# Patient Record
Sex: Male | Born: 1960
Health system: Southern US, Community
[De-identification: ages and names within clinical notes are randomized; demographics above are authoritative.]

## PROBLEM LIST (undated history)

## (undated) DIAGNOSIS — R011 Cardiac murmur, unspecified: Secondary | ICD-10-CM

## (undated) DIAGNOSIS — T7840XA Allergy, unspecified, initial encounter: Secondary | ICD-10-CM

## (undated) DIAGNOSIS — G4730 Sleep apnea, unspecified: Secondary | ICD-10-CM

## (undated) DIAGNOSIS — T4145XA Adverse effect of unspecified anesthetic, initial encounter: Secondary | ICD-10-CM

## (undated) DIAGNOSIS — R112 Nausea with vomiting, unspecified: Secondary | ICD-10-CM

## (undated) DIAGNOSIS — N200 Calculus of kidney: Secondary | ICD-10-CM

## (undated) DIAGNOSIS — E119 Type 2 diabetes mellitus without complications: Secondary | ICD-10-CM

## (undated) DIAGNOSIS — F419 Anxiety disorder, unspecified: Secondary | ICD-10-CM

## (undated) DIAGNOSIS — Z9889 Other specified postprocedural states: Secondary | ICD-10-CM

## (undated) DIAGNOSIS — C801 Malignant (primary) neoplasm, unspecified: Secondary | ICD-10-CM

## (undated) DIAGNOSIS — E291 Testicular hypofunction: Secondary | ICD-10-CM

## (undated) DIAGNOSIS — E785 Hyperlipidemia, unspecified: Secondary | ICD-10-CM

## (undated) DIAGNOSIS — E039 Hypothyroidism, unspecified: Secondary | ICD-10-CM

## (undated) HISTORY — DX: Anxiety disorder, unspecified: F41.9

## (undated) HISTORY — PX: COLONOSCOPY: SHX174

## (undated) HISTORY — DX: Testicular hypofunction: E29.1

## (undated) HISTORY — DX: Allergy, unspecified, initial encounter: T78.40XA

## (undated) HISTORY — DX: Hyperlipidemia, unspecified: E78.5

---

## 1999-08-31 DIAGNOSIS — T8859XA Other complications of anesthesia, initial encounter: Secondary | ICD-10-CM

## 1999-08-31 HISTORY — PX: THYROIDECTOMY: SHX17

## 1999-08-31 HISTORY — DX: Other complications of anesthesia, initial encounter: T88.59XA

## 2009-10-01 ENCOUNTER — Ambulatory Visit: Payer: Self-pay | Admitting: Internal Medicine

## 2009-10-01 DIAGNOSIS — E039 Hypothyroidism, unspecified: Secondary | ICD-10-CM | POA: Insufficient documentation

## 2009-10-01 DIAGNOSIS — E1169 Type 2 diabetes mellitus with other specified complication: Secondary | ICD-10-CM | POA: Insufficient documentation

## 2009-10-01 DIAGNOSIS — F528 Other sexual dysfunction not due to a substance or known physiological condition: Secondary | ICD-10-CM

## 2009-10-01 LAB — CONVERTED CEMR LAB
Albumin: 4.1 g/dL (ref 3.5–5.2)
Cholesterol: 205 mg/dL — ABNORMAL HIGH (ref 0–200)
Direct LDL: 127.3 mg/dL
HDL: 35.1 mg/dL — ABNORMAL LOW (ref >39.00)
TSH: 0.09 microintl units/mL — ABNORMAL LOW (ref 0.35–5.50)
Total Bilirubin: 0.5 mg/dL (ref 0.3–1.2)
Total CHOL/HDL Ratio: 6
Triglycerides: 175 mg/dL — ABNORMAL HIGH (ref 0.0–149.0)
VLDL: 35 mg/dL (ref 0.0–40.0)

## 2009-10-02 ENCOUNTER — Telehealth (INDEPENDENT_AMBULATORY_CARE_PROVIDER_SITE_OTHER): Payer: Self-pay | Admitting: *Deleted

## 2009-10-02 ENCOUNTER — Telehealth: Payer: Self-pay | Admitting: Internal Medicine

## 2009-10-02 ENCOUNTER — Encounter: Payer: Self-pay | Admitting: Internal Medicine

## 2009-11-12 ENCOUNTER — Ambulatory Visit: Payer: Self-pay | Admitting: Internal Medicine

## 2009-11-12 DIAGNOSIS — Z87442 Personal history of urinary calculi: Secondary | ICD-10-CM | POA: Insufficient documentation

## 2009-12-25 ENCOUNTER — Ambulatory Visit: Payer: Self-pay | Admitting: Internal Medicine

## 2009-12-25 DIAGNOSIS — Z9989 Dependence on other enabling machines and devices: Secondary | ICD-10-CM

## 2009-12-25 DIAGNOSIS — G4733 Obstructive sleep apnea (adult) (pediatric): Secondary | ICD-10-CM | POA: Insufficient documentation

## 2010-01-09 ENCOUNTER — Ambulatory Visit: Payer: Self-pay | Admitting: Pulmonary Disease

## 2010-01-09 DIAGNOSIS — G4726 Circadian rhythm sleep disorder, shift work type: Secondary | ICD-10-CM | POA: Insufficient documentation

## 2010-02-18 ENCOUNTER — Encounter: Payer: Self-pay | Admitting: Pulmonary Disease

## 2010-02-18 ENCOUNTER — Ambulatory Visit (HOSPITAL_BASED_OUTPATIENT_CLINIC_OR_DEPARTMENT_OTHER): Admission: RE | Admit: 2010-02-18 | Discharge: 2010-02-18 | Payer: Self-pay | Admitting: Pulmonary Disease

## 2010-02-19 ENCOUNTER — Telehealth: Payer: Self-pay | Admitting: Pulmonary Disease

## 2010-02-19 ENCOUNTER — Ambulatory Visit: Payer: Self-pay | Admitting: Pulmonary Disease

## 2010-02-24 ENCOUNTER — Encounter: Payer: Self-pay | Admitting: Pulmonary Disease

## 2010-02-26 ENCOUNTER — Encounter: Payer: Self-pay | Admitting: Pulmonary Disease

## 2010-04-27 ENCOUNTER — Telehealth: Payer: Self-pay | Admitting: Internal Medicine

## 2010-04-27 DIAGNOSIS — N281 Cyst of kidney, acquired: Secondary | ICD-10-CM | POA: Insufficient documentation

## 2010-04-28 ENCOUNTER — Encounter: Payer: Self-pay | Admitting: Pulmonary Disease

## 2010-05-01 ENCOUNTER — Encounter: Admission: RE | Admit: 2010-05-01 | Discharge: 2010-05-01 | Payer: Self-pay | Admitting: Internal Medicine

## 2010-05-05 ENCOUNTER — Telehealth: Payer: Self-pay | Admitting: Internal Medicine

## 2010-06-16 ENCOUNTER — Encounter: Payer: Self-pay | Admitting: Pulmonary Disease

## 2010-09-29 NOTE — Letter (Signed)
Summary: [facility] Results Follow Up Letter  [facility]  [address]   [place], [place] [address]   Phone: [phone]  Fax: [phone]    02/24/2010 MRN: [id number]  Daniel Case [address] [place], [place]  [address]  Dear Mr. Downie,  We have received the results from your recent tests and have been unable to contact you.  Please call our office at [phone] so that Dr.___________ or his nurse may review the results with you.    Thank you,  Nature conservation officer Pulmonary Division

## 2010-09-29 NOTE — Progress Notes (Signed)
Summary: Medical Release completed  Medical Release completed to obtain records from Dr. Willa Rough. After numerous failed attempts, records were mailed to [address] # Daniel Case [place], [place] [address]. [name]  October 02, 2009 3:00 PM

## 2010-09-29 NOTE — Assessment & Plan Note (Signed)
Summary: NEW [employer] PT-PKG/OFF--STC   Vital Signs:  Patient profile:   50 year old male Height:      76 inches Weight:      222.50 pounds BMI:     27.18 O2 Sat:      96 % on Room air Temp:     98.3 degrees F oral Pulse rate:   65 / minute Pulse rhythm:   regular Resp:     16 per minute BP sitting:   130 / 80  (left arm)  Vitals Entered By: Lucious Groves (October 01, 2009 3:54 PM)  Nutrition Counseling: Patient's BMI is greater than 25 and therefore counseled on weight management options.  O2 Flow:  Room air CC: NP--Est care, no complaints. Is Patient Diabetic? No Pain Assessment Patient in pain? no        Primary Care Provider:  Yetta Barre  CC:  NP--Est care and no complaints..  History of Present Illness: New to me he needs a new PCP after moving here from [facility]. He feels well and offers no complaints. He requests a refill on Cialis.  Preventive Screening-Counseling & Management  Alcohol-Tobacco     Alcohol drinks/day: 1     Alcohol type: beer     >5/day in last 3 mos: no     Alcohol Counseling: not indicated; use of alcohol is not excessive or problematic     Feels need to cut down: no     Feels annoyed by complaints: no     Feels guilty re: drinking: no     Needs 'eye opener' in am: no     Smoking Status: never  Hep-HIV-STD-Contraception     Hepatitis Risk: no risk noted     HIV Risk: no risk noted     STD Risk: no risk noted     TSE monthly: yes  Safety-Violence-Falls     Seat Belt Use: yes     Helmet Use: yes     Firearms in the Home: no firearms in the home     Smoke Detectors: yes     Violence in the Home: no risk noted     Sexual Abuse: no      Sexual History:  currently monogamous.        Drug Use:  never.        Blood Transfusions:  no.    Current Medications (verified): 1)  Synthroid 125 Mcg Tabs (Levothyroxine Sodium) .Marland Kitchen.. 1 Po Bid 2)  Mvi .... Occasionally 3)  Fish Oil .... Occasionally  Allergies (verified): 1)  ! *  Shellfish  Past History:  Past Medical History: Hypothyroidism  Past Surgical History: Thyroidectomy  Social History: Smoking Status:  never Hepatitis Risk:  no risk noted HIV Risk:  no risk noted STD Risk:  no risk noted Seat Belt Use:  yes Sexual History:  currently monogamous Drug Use:  never Blood Transfusions:  no  Review of Systems  The patient denies anorexia, fever, weight loss, weight gain, chest pain, syncope, dyspnea on exertion, peripheral edema, prolonged cough, headaches, hemoptysis, abdominal pain, difficulty walking, depression, enlarged lymph nodes, angioedema, and testicular masses.   GU:  Complains of erectile dysfunction; denies decreased libido, dysuria, hematuria, incontinence, nocturia, urinary frequency, and urinary hesitancy. Endo:  Denies cold intolerance, excessive hunger, excessive thirst, excessive urination, heat intolerance, polyuria, and weight change.  Physical Exam  General:  Well-developed,well-nourished,in no acute distress; alert,appropriate and cooperative throughout examination Head:  normocephalic, atraumatic, no abnormalities observed, and no abnormalities palpated.  Mouth:  Oral mucosa and oropharynx without lesions or exudates.  Teeth in good repair. Neck:  supple, no masses, no thyromegaly, no thyroid nodules or tenderness, no JVD, no carotid bruits, and no cervical lymphadenopathy.  +scar. Lungs:  Normal respiratory effort, chest expands symmetrically. Lungs are clear to auscultation, no crackles or wheezes. Heart:  Normal rate and regular rhythm. S1 and S2 normal without gallop, murmur, click, rub or other extra sounds. Abdomen:  Bowel sounds positive,abdomen soft and non-tender without masses, organomegaly or hernias noted. Msk:  No deformity or scoliosis noted of thoracic or lumbar spine.   Pulses:  R and L carotid,radial,femoral,dorsalis pedis and posterior tibial pulses are full and equal bilaterally Extremities:  No clubbing,  cyanosis, edema, or deformity noted with normal full range of motion of all joints.   Neurologic:  No cranial nerve deficits noted. Station and gait are normal. Plantar reflexes are down-going bilaterally. DTRs are symmetrical throughout. Sensory, motor and coordinative functions appear intact. Skin:  Intact without suspicious lesions or rashes Cervical Nodes:  No lymphadenopathy noted Psych:  Cognition and judgment appear intact. Alert and cooperative with normal attention span and concentration. No apparent delusions, illusions, hallucinations   Impression & Recommendations:  Problem # 1:  ERECTILE DYSFUNCTION (ICD-302.72) Assessment Unchanged  His updated medication list for this problem includes:    Cialis 20 Mg Tabs (Tadalafil) .Marland Kitchen... Take as directed  Discussed proper use of medications, as well as side effects.   Problem # 2:  HYPOTHYROIDISM (ICD-244.9) Assessment: Deteriorated  His updated medication list for this problem includes:    Synthroid 125 Mcg Tabs (Levothyroxine sodium) .Marland Kitchen... 1 po bid  Orders: Venipuncture ([address]) TLB-Lipid Panel (80061-LIPID) TLB-Hepatic/Liver Function Pnl (80076-HEPATIC) TLB-TSH (Thyroid Stimulating Hormone) (84443-TSH)  Complete Medication List: 1)  Synthroid 125 Mcg Tabs (Levothyroxine sodium) .Marland Kitchen.. 1 po bid 2)  Mvi  .... Occasionally 3)  Fish Oil  .... Occasionally 4)  Cialis 20 Mg Tabs (Tadalafil) .... Take as directed  Patient Instructions: 1)  Please schedule a follow-up appointment in 4 months for a complete physical. 2)  It is important that you exercise regularly at least 20 minutes 5 times a week. If you develop chest pain, have severe difficulty breathing, or feel very tired , stop exercising immediately and seek medical attention. 3)  You need to lose weight. Consider a lower calorie diet and regular exercise.  Prescriptions: CIALIS 20 MG TABS (TADALAFIL) Take as directed  #3 x 11   Entered and Authorized by:   Etta Grandchild  MD   Signed by:   Etta Grandchild MD on 10/01/2009   Method used:   Print then Give to Patient   RxID:   [id number]     Immunization History:  Tetanus/Td Immunization History:    Tetanus/Td:  td (01/08/2004)  Not Administered:    Influenza Vaccine # 1 not given due to: Pt had in 05/2009

## 2010-09-29 NOTE — Letter (Signed)
Summary: Results Follow-up Letter  [facility]  [address] [place], [place] [address]   Phone: [phone]  Fax: [phone]    10/02/2009  [address] [place], [place]  [address]  Dear Daniel Case,   The following are the results of your recent test(s):  Test     Result     Thyroid     dose is a little too high Liver       normal   _________________________________________________________  Please call for an appointment in 1-2 months _________________________________________________________ _________________________________________________________ _________________________________________________________  Sincerely,  Sanda Linger MD [facility]

## 2010-09-29 NOTE — Progress Notes (Signed)
Summary: NEEDS ORDER FOR TEST  Phone Note Call from Patient Call back at Home Phone [phone]   Summary of Call: Patient states that he is due for f/u "kidney scan". Last one was last year in [place].  Initial call taken by: Lamar Sprinkles, CMA,  April 27, 2010 12:27 PM  Follow-up for Phone Call        done Follow-up by: Etta Grandchild MD,  April 27, 2010 12:31 PM  Additional Follow-up for Phone Call Additional follow up Details #1::        Pt informed  Additional Follow-up by: Lamar Sprinkles, CMA,  April 27, 2010 1:47 PM  New Problems: RENAL CYST (ICD-593.2)   New Problems: RENAL CYST (ICD-593.2)

## 2010-09-29 NOTE — Assessment & Plan Note (Signed)
Summary: 1-2 mos f/u per wife/#/cd    Vital Signs:  Patient profile:   50 year old male Height:      76 inches Weight:      227 pounds BMI:     27.73 O2 Sat:      97 % on Room air Temp:     97.6 degrees F oral Pulse rate:   67 / minute Pulse rhythm:   regular Resp:     16 per minute BP sitting:   128 / 70  (left arm) Cuff size:   large  Vitals Entered By: Rock Nephew CMA (November 12, 2009 8:51 AM)  O2 Flow:  Room air CC: follow-up visit Is Patient Diabetic? No Pain Assessment Patient in pain? no        Primary Care Provider:  Yetta Barre  CC:  follow-up visit.  History of Present Illness: He returns for a thyroid recheck, has last TSH was suppressed. He feels well.  Preventive Screening-Counseling & Management  Alcohol-Tobacco     Alcohol drinks/day: 1     Alcohol type: beer     >5/day in last 3 mos: no     Alcohol Counseling: not indicated; use of alcohol is not excessive or problematic     Feels need to cut down: no     Feels annoyed by complaints: no     Feels guilty re: drinking: no     Needs 'eye opener' in am: no     Smoking Status: never  Caffeine-Diet-Exercise     Does Patient Exercise: yes  Hep-HIV-STD-Contraception     Hepatitis Risk: no risk noted     HIV Risk: no risk noted     STD Risk: no risk noted     TSE monthly: yes      Sexual History:  currently monogamous.        Drug Use:  no.        Blood Transfusions:  no.    Clinical Review Panels:  Lipid Management   Cholesterol:  205 (10/01/2009)   HDL (good cholesterol):  35.10 (10/01/2009)  Complete Metabolic Panel   Albumin:  4.1 (10/01/2009)   Total Protein:  6.6 (10/01/2009)   Total Bili:  0.5 (10/01/2009)   Alk Phos:  68 (10/01/2009)   SGPT (ALT):  27 (10/01/2009)   SGOT (AST):  24 (10/01/2009)   Current Medications (verified): 1)  Synthroid 125 Mcg Tabs (Levothyroxine Sodium) .Marland Kitchen.. 1 Po Bid 2)  Mvi .... Occasionally 3)  Fish Oil .... Occasionally 4)  Cialis 20 Mg Tabs  (Tadalafil) .... Take As Directed  Allergies (verified): 1)  ! * Shellfish  Past History:  Past Medical History: Hypothyroidism Nephrolithiasis, hx of Benign renal cysts (B)  Past Surgical History: Reviewed history from 10/01/2009 and no changes required. Thyroidectomy  Family History: none  Social History: Occupation: Estate manager/land agent. Married Never Smoked Alcohol use-no Drug use-no Regular exercise-yes Drug Use:  no Does Patient Exercise:  yes  Review of Systems       The patient complains of weight gain.  The patient denies anorexia, weight loss, hoarseness, chest pain, syncope, dyspnea on exertion, peripheral edema, prolonged cough, abdominal pain, hematuria, difficulty walking, depression, enlarged lymph nodes, and angioedema.   Endo:  Denies cold intolerance, excessive hunger, excessive thirst, excessive urination, heat intolerance, polyuria, and weight change.  Physical Exam  General:  Well-developed,well-nourished,in no acute distress; alert,appropriate and cooperative throughout examination Neck:  supple, no masses, no thyromegaly, no thyroid nodules or tenderness, no JVD,  no carotid bruits, and no cervical lymphadenopathy.  +scar. Lungs:  Normal respiratory effort, chest expands symmetrically. Lungs are clear to auscultation, no crackles or wheezes. Heart:  Normal rate and regular rhythm. S1 and S2 normal without gallop, murmur, click, rub or other extra sounds. Abdomen:  Bowel sounds positive,abdomen soft and non-tender without masses, organomegaly or hernias noted. Msk:  No deformity or scoliosis noted of thoracic or lumbar spine.   Pulses:  R and L carotid,radial,femoral,dorsalis pedis and posterior tibial pulses are full and equal bilaterally Extremities:  No clubbing, cyanosis, edema, or deformity noted with normal full range of motion of all joints.   Neurologic:  No cranial nerve deficits noted. Station and gait are normal. Plantar reflexes are down-going  bilaterally. DTRs are symmetrical throughout. Sensory, motor and coordinative functions appear intact. Skin:  Intact without suspicious lesions or rashes Cervical Nodes:  no anterior cervical adenopathy and no posterior cervical adenopathy.   Axillary Nodes:  no R axillary adenopathy and no L axillary adenopathy.   Psych:  Cognition and judgment appear intact. Alert and cooperative with normal attention span and concentration. No apparent delusions, illusions, hallucinations   Impression & Recommendations:  Problem # 1:  HYPOTHYROIDISM (ICD-244.9) Assessment Unchanged  The following medications were removed from the medication list:    Synthroid 125 Mcg Tabs (Levothyroxine sodium) .Marland Kitchen... 1 po bid His updated medication list for this problem includes:    Levothyroxine Sodium 150 Mcg Tabs (Levothyroxine sodium) ..... One by mouth once daily  Orders: Venipuncture (66440) TLB-TSH (Thyroid Stimulating Hormone) (84443-TSH)  Labs Reviewed: TSH: 0.09 (10/01/2009)    Chol: 205 (10/01/2009)   HDL: 35.10 (10/01/2009)   TG: 175.0 (10/01/2009)  Complete Medication List: 1)  Mvi  .... Occasionally 2)  Fish Oil  .... Occasionally 3)  Cialis 20 Mg Tabs (Tadalafil) .... Take as directed 4)  Levothyroxine Sodium 150 Mcg Tabs (Levothyroxine sodium) .... One by mouth once daily  Patient Instructions: 1)  Please schedule a follow-up appointment in 3 months. Prescriptions: LEVOTHYROXINE SODIUM 150 MCG TABS (LEVOTHYROXINE SODIUM) One by mouth once daily  #30 x 5   Entered and Authorized by:   Etta Grandchild MD   Signed by:   Etta Grandchild MD on 11/12/2009   Method used:   Electronically to        [facility] [address] (retail)       [phone]       [place], [place]  [address]       Ph: [phone]       Fax: [phone]   RxID:   [id number]

## 2010-09-29 NOTE — Progress Notes (Signed)
  Phone Note Outgoing Call   Summary of Call: Attempted to reach pt for results of sleep study - showed severe obstructive sleep apnea  - CPAP will be set up - OV in 2 weeks to discuss Initial call taken by: Comer Locket. Vassie Loll MD,  February 19, 2010 5:29 PM  Follow-up for Phone Call        unable to reach pt mailed note to call us back Follow-up by: Oneita Jolly,  February 24, 2010 10:22 AM

## 2010-09-29 NOTE — Assessment & Plan Note (Signed)
Summary: sleep apnea/Daniel Case   Visit Type:  Initial Consult Copy to:  pcp Primary Provider/Referring Provider:  Etta Grandchild MD  CC:  Pt here for sleep consult.  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 Epworth Sleepiness Score 9. He has worked the night shift x 16 yrs & has moved from [place] to [place] to be closer to his daughter. Bedtime is 0900, minimal sleep latency, 2-4 awkenings for BR visits, wakes up at 1600, feeling refreshed with occasional headaches & dry mouth. On weekends, he slips back to normal schedule of 0200 bedtime & 0900 wake up time. He has gained about 15 lbs int he last 2 yrs. His father has obstructive sleep apnea on cpap. He drinks 6 cups of coffee/ night of work. There is no history suggestive of cataplexy, sleep paralysis or parasomnias    History of Present Illness: stop breathing  What time do you typically go to bed?(between what hours): 0900-1400  How long does it take you to fall asleep? 0-10 min  How many times during the night do you wake up? 2-4  What time do you get out of bed to start your day? 1600  Do you drive or operate heavy machinery in your occupation? yes  How much has your weight changed (up or down) over the past two years? (in pounds): +10 lbs  Have you ever had a sleep study before?  If yes,when and where: no  Do you currently use CPAP ? If so , at what pressure? no  Do you wear oxygen at any time? If yes, how many liters per minute? no Current Medications (verified): 1)  Multivitamins   Tabs (Multiple Vitamin) .... Take 1 Tablet By Mouth Once A Day 2)  Fish Oil .... Occasionally 3)  Cialis 20 Mg Tabs (Tadalafil) .... Take As Directed 4)  Levothyroxine Sodium 150 Mcg Tabs (Levothyroxine Sodium) .... One By Mouth Once Daily  Allergies (verified): 1)  ! * Shellfish  Past History:  Past Medical History: Last updated:  11/12/2009 Hypothyroidism Nephrolithiasis, hx of Benign renal cysts (B)  Past Surgical History: Thyroidectomy- 2002  Social History: Occupation: Estate manager/land agent. Married 3 children Alcohol use-no Drug use-no Regular exercise-yes Occ cigar  Review of Systems       The patient complains of weight change and nasal congestion/difficulty breathing through nose.  The patient denies shortness of breath with activity, shortness of breath at rest, productive cough, non-productive cough, coughing up blood, chest pain, irregular heartbeats, acid heartburn, indigestion, loss of appetite, abdominal pain, difficulty swallowing, sore throat, tooth/dental problems, headaches, sneezing, itching, ear ache, anxiety, depression, hand/feet swelling, joint stiffness or pain, rash, change in color of mucus, and fever.    Vital Signs:  Patient profile:   50 year old male Height:      76 inches Weight:      229 pounds BMI:     27.98 O2 Sat:      96 % on Room air Temp:     98.3 degrees F oral Pulse rate:   77 / minute BP sitting:   130 / 90  (left arm) Cuff size:   large  Vitals Entered By: Zackery Barefoot CMA (Jan 09, 2010 4:25 PM)  O2 Flow:  Room air CC: Pt here for sleep consult Comments Medications reviewed with patient Verified contact number and pharmacy with patient Zackery Barefoot CMA  Jan 09, 2010 4:26 PM  Physical Exam  Additional Exam:  Gen. Pleasant, well-nourished, in no distress, normal affect ENT - no lesions, no post nasal drip, class 2 airway Neck: No JVD, no thyromegaly, no carotid bruits Lungs: no use of accessory muscles, no dullness to percussion, clear without rales or rhonchi  Cardiovascular: Rhythm regular, heart sounds  normal, no murmurs or gallops, no peripheral edema Abdomen: soft and non-tender, no hepatosplenomegaly, BS normal. Musculoskeletal: No deformities, no cyanosis or clubbing Neuro:  alert, non focal     Impression & Recommendations:  Problem  # 1:  SLEEP APNEA (ICD-780.57) The pathophysiology of obstructive sleep apnea, it's cardiovascular consequences and modes of treatment including CPAP were discussed with the patient in great detail.  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 Discussed behaviour techniques.  Orders: Consultation Level III ([address])  Medications Added to Medication List This Visit: 1)  Multivitamins Tabs (Multiple vitamin) .... Take 1 tablet by mouth once a day  Patient Instructions: 1)  Copy sent to:Dr Jones 2)  Please schedule a follow-up appointment in 2 weeks after sleep study   Immunization History:  Influenza Immunization History:    Influenza:  historical (06/30/2009)    Appended Document: sleep apnea/Daniel Case downlaod 7/28- 04/28/10 >> residual events + on 10 cm, good usage, no leak pl arange FU OV  Appended Document: sleep apnea/Daniel Case LMOMTCB  Appended Document: sleep apnea/Daniel Case LMOMTCB X 2. JWR  Appended Document: sleep apnea/Daniel Case lmomtcb x 3. jwr  Appended Document: sleep apnea/Daniel Case letter sent. jwr

## 2010-09-29 NOTE — Progress Notes (Signed)
  Phone Note Refill Request   Refills Requested: Medication #1:  LEVOTHYROXINE SODIUM 150 MCG TABS One by mouth once daily.   Supply Requested: 1 month Initial call taken by: Rock Nephew CMA,  May 05, 2010 12:39 PM    Prescriptions: LEVOTHYROXINE SODIUM 150 MCG TABS (LEVOTHYROXINE SODIUM) One by mouth once daily  #30 x 5   Entered by:   Rock Nephew CMA   Authorized by:   Etta Grandchild MD   Signed by:   Rock Nephew CMA on 05/05/2010   Method used:   Electronically to        [facility] [address] (retail)       [phone]       [place], [place]  [address]       Ph: [phone]       Fax: [phone]   RxID:   [id number]

## 2010-09-29 NOTE — Progress Notes (Signed)
Summary: Rx refill  Phone Note Call from Patient Call back at Home Phone [phone]   Caller: Patient Call For: Dr. Sanda Linger Reason for Call: Refill Medication Summary of Call: Patient's wife came into the office to give [place] her husband's rx info so we could call in a prescription for him. He was unsure of the dosage. It is Levothyroxin 125 MCG 2 tabs per day. He uses [facility] [address]. Initial call taken by: Irma Newness,  October 02, 2009 1:06 PM  Follow-up for Phone Call        [place]le dpt no ansew [facility] rx sent to [facility] Follow-up by: Orlan Leavens,  October 02, 2009 2:07 PM    Prescriptions: SYNTHROID 125 MCG TABS (LEVOTHYROXINE SODIUM) 1 po bid  #60 x 3   Entered by:   Orlan Leavens   Authorized by:   Etta Grandchild MD   Signed by:   Orlan Leavens on 10/02/2009   Method used:   Electronically to        [facility] [address] (retail)       [phone]       [place], [place]  [address]       Ph: [phone]       Fax: [phone]   RxID:   [id number]

## 2010-09-29 NOTE — Letter (Signed)
Summary: Results Follow-up Letter  [facility]  [address] [place], [place] [address]   Phone: [phone]  Fax: [phone]    11/12/2009  [address] [place], [place]  [address]  Dear Mr. Beegle,   The following are the results of your recent test(s):  Test     Result     Thyroid     dose is too high  _________________________________________________________  Please call for an appointment in 1-2 months, I have DECREASED your dose _________________________________________________________ _________________________________________________________ _________________________________________________________  Sincerely,  Sanda Linger MD [facility]

## 2010-09-29 NOTE — Letter (Signed)
Summary: Lipid Letter  [facility]  [address] [place], [place] [address]   Phone: [phone]  Fax: [phone]    10/02/2009  Daniel Case [address] [place], [place]  [address]  Dear Daniel Case:  We have carefully reviewed your last lipid profile from  and the results are noted below with a summary of recommendations for lipid management.    Cholesterol:       205     Goal: <200   HDL "good" Cholesterol:   38.18     Goal: >40   LDL "bad" Cholesterol:   127     Goal: <130   Triglycerides:       175.0     Goal: <150        TLC Diet (Therapeutic Lifestyle Change): Saturated Fats & Transfatty acids should be kept < 7% of total calories ***Reduce Saturated Fats Polyunstaurated Fat can be up to 10% of total calories Monounsaturated Fat Fat can be up to 20% of total calories Total Fat should be no greater than 25-35% of total calories Carbohydrates should be 50-60% of total calories Protein should be approximately 15% of total calories Fiber should be at least 20-30 grams a day ***Increased fiber may help lower LDL Total Cholesterol should be < 200mg /day Consider adding plant stanol/sterols to diet (example: Benacol spread) ***A higher intake of unsaturated fat may reduce Triglycerides and Increase HDL    Adjunctive Measures (may lower LIPIDS and reduce risk of Heart Attack) include: Aerobic Exercise (20-30 minutes 3-4 times a week) Limit Alcohol Consumption Weight Reduction Aspirin 75-81 mg a day by mouth (if not allergic or contraindicated) Dietary Fiber 20-30 grams a day by mouth     Current Medications: 1)    Synthroid 125 Mcg Tabs (Levothyroxine sodium) .Marland Kitchen.. 1 po bid 2)    Mvi  .... Occasionally 3)    Fish Oil  .... Occasionally 4)    Cialis 20 Mg Tabs (Tadalafil) .... Take as directed  If you have any questions, please call. We appreciate being able to work with you.   Sincerely,    [facility]

## 2010-09-29 NOTE — Letter (Signed)
SummaryScience writer Pulmonary Care Appointment Letter  [facility]  [address]   [place], [place] [address]   Phone: [phone]  Fax: [phone]    06/16/2010 MRN: [id number]  Daniel Case [address] [place], [place]  [address]  Dear [name],   Our office is attempting to contact you about an appointment.  Please call our office at [phone] to schedule this appointment with Dr.Alva.  Our registration staff is prepared to assist you with any questions you may have.    Thank you,   [facility]   Appended Document: [facility] Appointment Letter letter mailed. jwr

## 2010-09-29 NOTE — Letter (Signed)
Summary: CMN/CPAP supplies/[facility]  CMN/CPAP supplies/[facility]   Imported By: Lester [place] 03/06/2010 09:34:21  _____________________________________________________________________  External Attachment:    Type:   Image     Comment:   External Document

## 2010-09-29 NOTE — Assessment & Plan Note (Signed)
Summary: discuss thyroid/cd   Vital Signs:  Patient profile:   50 year old male Height:      76 inches Weight:      230 pounds BMI:     28.10 O2 Sat:      96 % on Room air Temp:     98.2 degrees F oral Pulse rate:   58 / minute Pulse rhythm:   regular Resp:     16 per minute BP sitting:   138 / 88  (left arm) Cuff size:   large  Vitals Entered By: Rock Nephew CMA (December 25, 2009 9:29 AM)  Nutrition Counseling: Patient's BMI is greater than 25 and therefore counseled on weight management options.  O2 Flow:  Room air CC: discuss TSH Is Patient Diabetic? No Pain Assessment Patient in pain? no        Primary Care Provider:  Yetta Barre  CC:  discuss TSH.  History of Present Illness: He returns for f/up and says that he was mistakingly taking his thyroid medication twice a day instead of once a day and has been back on the prescribed dose for about one week. This explains the suppressed TSH.  He has a new complaint of sleep apnea. His wife has witnessed long periods of apnea and she has to wake him up. He has fatigue and an inability to lose weight.  Preventive Screening-Counseling & Management  Alcohol-Tobacco     Alcohol drinks/day: 1     Alcohol type: beer     >5/day in last 3 mos: no     Alcohol Counseling: not indicated; use of alcohol is not excessive or problematic     Feels need to cut down: no     Feels annoyed by complaints: no     Feels guilty re: drinking: no     Needs 'eye opener' in am: no     Smoking Status: never  Hep-HIV-STD-Contraception     Hepatitis Risk: no risk noted     HIV Risk: no risk noted     STD Risk: no risk noted     TSE monthly: yes      Sexual History:  currently monogamous.        Drug Use:  no.        Blood Transfusions:  no.    Clinical Review Panels:  Lipid Management   Cholesterol:  205 (10/01/2009)   HDL (good cholesterol):  35.10 (10/01/2009)  Complete Metabolic Panel   Albumin:  4.1 (10/01/2009)   Total Protein:   6.6 (10/01/2009)   Total Bili:  0.5 (10/01/2009)   Alk Phos:  68 (10/01/2009)   SGPT (ALT):  27 (10/01/2009)   SGOT (AST):  24 (10/01/2009)   Medications Prior to Update: 1)  Mvi .... Occasionally 2)  Fish Oil .... Occasionally 3)  Cialis 20 Mg Tabs (Tadalafil) .... Take As Directed 4)  Levothyroxine Sodium 150 Mcg Tabs (Levothyroxine Sodium) .... One By Mouth Once Daily  Current Medications (verified): 1)  Mvi .... Occasionally 2)  Fish Oil .... Occasionally 3)  Cialis 20 Mg Tabs (Tadalafil) .... Take As Directed 4)  Levothyroxine Sodium 150 Mcg Tabs (Levothyroxine Sodium) .... One By Mouth Once Daily  Allergies (verified): 1)  ! * Shellfish  Past History:  Past Medical History: Reviewed history from 11/12/2009 and no changes required. Hypothyroidism Nephrolithiasis, hx of Benign renal cysts (B)  Past Surgical History: Reviewed history from 10/01/2009 and no changes required. Thyroidectomy  Family History: Reviewed history from 11/12/2009 and  no changes required. none  Social History: Reviewed history from 11/12/2009 and no changes required. Occupation: Estate manager/land agent. Married Never Smoked Alcohol use-no Drug use-no Regular exercise-yes  Review of Systems       The patient complains of weight gain.  The patient denies anorexia, chest pain, abdominal pain, suspicious skin lesions, difficulty walking, and depression.   Endo:  Complains of weight change; denies cold intolerance, excessive hunger, excessive thirst, excessive urination, heat intolerance, and polyuria.  Physical Exam  General:  Well-developed,well-nourished,in no acute distress; alert,appropriate and cooperative throughout examination Head:  normocephalic, atraumatic, no abnormalities observed, and no abnormalities palpated.   Mouth:  Oral mucosa and oropharynx without lesions or exudates.  Teeth in good repair. Neck:  supple, no masses, no thyromegaly, no thyroid nodules or tenderness, no JVD,  no carotid bruits, and no cervical lymphadenopathy.  +scar. Lungs:  Normal respiratory effort, chest expands symmetrically. Lungs are clear to auscultation, no crackles or wheezes. Heart:  Normal rate and regular rhythm. S1 and S2 normal without gallop, murmur, click, rub or other extra sounds. Abdomen:  Bowel sounds positive,abdomen soft and non-tender without masses, organomegaly or hernias noted. Msk:  No deformity or scoliosis noted of thoracic or lumbar spine.   Pulses:  R and L carotid,radial,femoral,dorsalis pedis and posterior tibial pulses are full and equal bilaterally Extremities:  No clubbing, cyanosis, edema, or deformity noted with normal full range of motion of all joints.   Neurologic:  No cranial nerve deficits noted. Station and gait are normal. Plantar reflexes are down-going bilaterally. DTRs are symmetrical throughout. Sensory, motor and coordinative functions appear intact. Skin:  Intact without suspicious lesions or rashes Cervical Nodes:  no anterior cervical adenopathy and no posterior cervical adenopathy.   Psych:  Cognition and judgment appear intact. Alert and cooperative with normal attention span and concentration. No apparent delusions, illusions, hallucinations   Impression & Recommendations:  Problem # 1:  HYPOTHYROIDISM (ICD-244.9) Assessment Unchanged it is too soon to recheck the TSH so will not order one today. His updated medication list for this problem includes:    Levothyroxine Sodium 150 Mcg Tabs (Levothyroxine sodium) ..... One by mouth once daily  Problem # 2:  SLEEP APNEA (ICD-780.57) Assessment: New  Orders: Sleep Disorder Referral (Sleep Disorder)  Complete Medication List: 1)  Mvi  .... Occasionally 2)  Fish Oil  .... Occasionally 3)  Cialis 20 Mg Tabs (Tadalafil) .... Take as directed 4)  Levothyroxine Sodium 150 Mcg Tabs (Levothyroxine sodium) .... One by mouth once daily  Patient Instructions: 1)  Please schedule a follow-up  appointment in 2 months. 2)  It is important that you exercise regularly at least 20 minutes 5 times a week. If you develop chest pain, have severe difficulty breathing, or feel very tired , stop exercising immediately and seek medical attention. 3)  You need to lose weight. Consider a lower calorie diet and regular exercise.

## 2013-08-20 ENCOUNTER — Other Ambulatory Visit: Payer: Self-pay | Admitting: Internal Medicine

## 2013-08-20 MED ORDER — TESTOSTERONE CYPIONATE 200 MG/ML IM SOLN: INTRAMUSCULAR | Status: DC

## 2013-09-26 DIAGNOSIS — E785 Hyperlipidemia, unspecified: Secondary | ICD-10-CM | POA: Insufficient documentation

## 2013-09-26 DIAGNOSIS — F419 Anxiety disorder, unspecified: Secondary | ICD-10-CM | POA: Insufficient documentation

## 2013-09-26 DIAGNOSIS — E1069 Type 1 diabetes mellitus with other specified complication: Secondary | ICD-10-CM | POA: Insufficient documentation

## 2013-09-26 DIAGNOSIS — E349 Endocrine disorder, unspecified: Secondary | ICD-10-CM | POA: Insufficient documentation

## 2013-09-27 ENCOUNTER — Encounter: Payer: Self-pay | Admitting: Internal Medicine

## 2013-10-28 ENCOUNTER — Encounter: Payer: Self-pay | Admitting: Internal Medicine

## 2013-10-28 DIAGNOSIS — I10 Essential (primary) hypertension: Secondary | ICD-10-CM | POA: Insufficient documentation

## 2013-10-28 DIAGNOSIS — R030 Elevated blood-pressure reading, without diagnosis of hypertension: Secondary | ICD-10-CM | POA: Insufficient documentation

## 2013-10-28 DIAGNOSIS — Z79899 Other long term (current) drug therapy: Secondary | ICD-10-CM | POA: Insufficient documentation

## 2013-10-28 NOTE — Progress Notes (Signed)
Patient ID: Daniel Case, male   DOB: 1961/02/28, 53 y.o.   MRN: [id number]   Annual Screening Comprehensive Examination  This very nice 53 y.o.  MWM presents for complete physical.  Patient has been followed for Hx/o elevated BP, Prediabetes, Hyperlipidemia, Testosterone and Vitamin D Deficiency.   HTN predates since 2013 and has been monitored expectantly. Today's BP: 138/84 mmHg. GFR was 69 - mild impairment in Sept 2014. Patient denies any cardiac symptoms as chest pain, palpitations, shortness of breath, dizziness or ankle swelling.   Patient's hyperlipidemia is controlled with diet and medications. Patient denies myalgias or other medication SE's. Last cholesterol last visit was 149, triglycerides 291, HDL 30 and LDL 61 in Sept 2014 - at goal.     Patient has prediabetes with A1c 5.7% in 2011 and  last A1c 5.8% in Sept 2014  . Patient denies reactive hypoglycemic symptoms, visual blurring, diabetic polys, or paresthesias.    Patient has Testosterone Deficiency and is on replacement therapy with Depo- Testosterone with last self injection 4 days ago into the Lt thigh.   Patient had thyroid cancer in 2001 and ws treated x 2 with RAI-131 and has been on replacement therapy since.   Finally, patient has history of Vitamin D Deficiency  Of 33 in 2011with last vitamin D 56 in Sept 2014.   Medication List       aspirin 81 MG tablet  Take 81 mg by mouth daily.     atorvastatin 80 MG tablet  Commonly known as:  LIPITOR  Take 80 mg by mouth daily.     B-D 3CC LUER-LOK SYR 21GX1-1/2 21G X 1-1/2" 3 ML Misc  Generic drug:  SYRINGE-NEEDLE (DISP) 3 ML     FISH OIL PO  Take by mouth daily.     levothyroxine 200 MCG tablet  Commonly known as:  SYNTHROID, LEVOTHROID  Take 200 mcg by mouth daily before breakfast.     testosterone cypionate 200 MG/ML injection  Commonly known as:  DEPO-TESTOSTERONE  2 cc or as directed intramuscular every 14 days - and 3 cc syringes - and 21 G 1" needles      VITAMIN D PO  Take 4,000 Int'l Units by mouth daily.        Allergies  Allergen Reactions  . Shellfish Allergy Rash    Past Medical History  Diagnosis Date  . Anxiety   . Other testicular hypofunction   . Hyperlipidemia     Past Surgical History  Procedure Laterality Date  . Thyroidectomy  2001    Family History  Problem Relation Age of Onset  . Diabetes Father   . Hypertension Father   . Diabetes Sister     History   Social History  . Marital Status: Married    Spouse Name: N/A    Number of Children: N/A  . Years of Education: N/A   Occupational History  . Not on file.   Social History Main Topics  . Smoking status: Former Smoker    Types: Cigars  . Smokeless tobacco: Not on file     Comment: Patient states he quit since last physical  . Alcohol Use: 7.0 oz/week    14 drink(s) per week     Comment: occ  . Drug Use: No  . Sexual Activity: Not on file   Other Topics Concern  . Not on file   Social History Narrative  . No narrative on file    ROS Constitutional: Denies fever, chills, weight loss/gain,  headaches, insomnia, fatigue, night sweats, and change in appetite. Eyes: Denies redness, blurred vision, diplopia, discharge, itchy, watery eyes.  ENT: Denies discharge, congestion, post nasal drip, epistaxis, sore throat, earache, hearing loss, dental pain, Tinnitus, Vertigo, Sinus pain, snoring.  Cardio: Denies chest pain, palpitations, irregular heartbeat, syncope, dyspnea, diaphoresis, orthopnea, PND, claudication, edema Respiratory: denies cough, dyspnea, DOE, pleurisy, hoarseness, laryngitis, wheezing.  Gastrointestinal: Denies dysphagia, heartburn, reflux, water brash, pain, cramps, nausea, vomiting, bloating, diarrhea, constipation, hematemesis, melena, hematochezia, jaundice, hemorrhoids Genitourinary: Denies dysuria, frequency, urgency, nocturia, hesitancy, discharge, hematuria, flank pain Musculoskeletal: Denies arthralgia, myalgia,  stiffness, Jt. Swelling, pain, limp, and strain/sprain. Skin: Denies puritis, rash, hives, warts, acne, eczema, changing in skin lesion Neuro: No weakness, tremor, incoordination, spasms, paresthesia, pain Psychiatric: Denies confusion, memory loss, sensory loss Endocrine: Denies change in weight, skin, hair change, nocturia, and paresthesia, diabetic polys, visual blurring, hyper / hypo glycemic episodes.  Heme/Lymph: No excessive bleeding, bruising, or elarged lymph nodes.  BP: 138/84  Pulse: 76  Temp: 98.4 F (36.9 C)  Resp: 16    Estimated body mass index is 27.53 kg/(m^2) as calculated from the following:   Height as of this encounter: 6\' 5"  (1.956 m).   Weight as of this encounter: 232 lb 3.2 oz (105.325 kg).  Physical Exam General Appearance: Well nourished, in no apparent distress. Eyes: PERRLA, EOMs, conjunctiva no swelling or erythema, normal fundi and vessels. Sinuses: No frontal/maxillary tenderness ENT/Mouth: EACs patent / TMs  nl. Nares clear without erythema, swelling, mucoid exudates. Oral hygiene is good. No erythema, swelling, or exudate. Tongue normal, non-obstructing. Tonsils not swollen or erythematous. Hearing normal.  Neck: Supple, thyroid normal. No bruits, nodes or JVD. Respiratory: Respiratory effort normal.  BS equal and clear bilateral without rales, rhonci, wheezing or stridor. Cardio: Heart sounds are normal with regular rate and rhythm and no murmurs, rubs or gallops. Peripheral pulses are normal and equal bilaterally without edema. No aortic or femoral bruits. Chest: symmetric with normal excursions and percussion.  Abdomen: Flat, soft, with bowl sounds. Nontender, no guarding, rebound, hernias, masses, or organomegaly.  Lymphatics: Non tender without lymphadenopathy.  Genitourinary: No hernias.Testes nl. DRE - prostate nl for age - smooth & firm w/o nodules. Musculoskeletal: Full ROM all peripheral extremities, joint stability, 5/5 strength, and normal  gait. Skin: Warm and dry without rashes, lesions, cyanosis, clubbing or  ecchymosis.  Neuro: Cranial nerves intact, reflexes equal bilaterally. Normal muscle tone, no cerebellar symptoms. Sensation intact.  Pysch: Awake and oriented X 3, normal affect, insight and judgment appropriate.   Assessment and Plan  1. Annual Screening Examination 2. Hx/o Elevated BP 3. Hyperlipidemia 4. Pre Diabetes 5. Vitamin D Deficiency 6. Testosterone Deficiency  Continue prudent diet as discussed, weight control, BP monitoring, regular exercise, and medications as discussed.  Discussed med effects and SE's. Routine screening labs and tests as requested with regular follow-up as recommended.

## 2013-10-28 NOTE — Patient Instructions (Signed)

## 2013-10-29 ENCOUNTER — Encounter: Payer: Self-pay | Admitting: Internal Medicine

## 2013-10-29 ENCOUNTER — Ambulatory Visit (INDEPENDENT_AMBULATORY_CARE_PROVIDER_SITE_OTHER): Payer: Managed Care, Other (non HMO) | Admitting: Internal Medicine

## 2013-10-29 VITALS — BP 138/84 | HR 76 | Temp 98.4°F | Resp 16 | Ht 77.0 in | Wt 232.2 lb

## 2013-10-29 DIAGNOSIS — R7402 Elevation of levels of lactic acid dehydrogenase (LDH): Secondary | ICD-10-CM

## 2013-10-29 DIAGNOSIS — Z113 Encounter for screening for infections with a predominantly sexual mode of transmission: Secondary | ICD-10-CM

## 2013-10-29 DIAGNOSIS — R030 Elevated blood-pressure reading, without diagnosis of hypertension: Secondary | ICD-10-CM

## 2013-10-29 DIAGNOSIS — E538 Deficiency of other specified B group vitamins: Secondary | ICD-10-CM

## 2013-10-29 DIAGNOSIS — Z0000 Encounter for general adult medical examination without abnormal findings: Secondary | ICD-10-CM

## 2013-10-29 DIAGNOSIS — Z125 Encounter for screening for malignant neoplasm of prostate: Secondary | ICD-10-CM

## 2013-10-29 DIAGNOSIS — Z1212 Encounter for screening for malignant neoplasm of rectum: Secondary | ICD-10-CM

## 2013-10-29 DIAGNOSIS — E559 Vitamin D deficiency, unspecified: Secondary | ICD-10-CM

## 2013-10-29 DIAGNOSIS — R7401 Elevation of levels of liver transaminase levels: Secondary | ICD-10-CM

## 2013-10-29 DIAGNOSIS — Z23 Encounter for immunization: Secondary | ICD-10-CM

## 2013-10-29 DIAGNOSIS — R740 Nonspecific elevation of levels of transaminase and lactic acid dehydrogenase [LDH]: Secondary | ICD-10-CM

## 2013-10-29 DIAGNOSIS — Z111 Encounter for screening for respiratory tuberculosis: Secondary | ICD-10-CM

## 2013-10-29 LAB — HEPATITIS C ANTIBODY: HCV Ab: NEGATIVE

## 2013-10-29 LAB — HEPATIC FUNCTION PANEL
ALT: 32 U/L (ref 0–53)
AST: 23 U/L (ref 0–37)
Albumin: 4.6 g/dL (ref 3.5–5.2)
Alkaline Phosphatase: 73 U/L (ref 39–117)
BILIRUBIN DIRECT: 0.1 mg/dL (ref 0.0–0.3)
BILIRUBIN INDIRECT: 0.5 mg/dL (ref 0.2–1.2)
Total Bilirubin: 0.6 mg/dL (ref 0.2–1.2)
Total Protein: 7.2 g/dL (ref 6.0–8.3)

## 2013-10-29 LAB — CBC WITH DIFFERENTIAL/PLATELET
BASOS PCT: 1 % (ref 0–1)
Basophils Absolute: 0.1 10*3/uL (ref 0.0–0.1)
EOS ABS: 0.1 10*3/uL (ref 0.0–0.7)
Eosinophils Relative: 1 % (ref 0–5)
HCT: 44.3 % (ref 39.0–52.0)
Hemoglobin: 15.4 g/dL (ref 13.0–17.0)
Lymphocytes Relative: 24 % (ref 12–46)
Lymphs Abs: 1.5 10*3/uL (ref 0.7–4.0)
MCH: 31.6 pg (ref 26.0–34.0)
MCHC: 34.8 g/dL (ref 30.0–36.0)
MCV: 90.8 fL (ref 78.0–100.0)
Monocytes Absolute: 0.7 10*3/uL (ref 0.1–1.0)
Monocytes Relative: 12 % (ref 3–12)
NEUTROS PCT: 62 % (ref 43–77)
Neutro Abs: 3.8 10*3/uL (ref 1.7–7.7)
PLATELETS: 288 10*3/uL (ref 150–400)
RBC: 4.88 MIL/uL (ref 4.22–5.81)
RDW: 13.8 % (ref 11.5–15.5)
WBC: 6.2 10*3/uL (ref 4.0–10.5)

## 2013-10-29 LAB — HEMOGLOBIN A1C
Hgb A1c MFr Bld: 5.6 % (ref <5.7)
MEAN PLASMA GLUCOSE: 114 mg/dL (ref <117)

## 2013-10-29 LAB — VITAMIN B12: Vitamin B-12: 438 pg/mL (ref 211–911)

## 2013-10-29 LAB — BASIC METABOLIC PANEL WITH GFR
BUN: 17 mg/dL (ref 6–23)
CALCIUM: 9.5 mg/dL (ref 8.4–10.5)
CO2: 28 mEq/L (ref 19–32)
CREATININE: 1.04 mg/dL (ref 0.50–1.35)
Chloride: 106 mEq/L (ref 96–112)
GFR, Est Non African American: 82 mL/min
GLUCOSE: 96 mg/dL (ref 70–99)
Potassium: 4.7 mEq/L (ref 3.5–5.3)
SODIUM: 140 meq/L (ref 135–145)

## 2013-10-29 LAB — TSH: TSH: 1.471 u[IU]/mL (ref 0.350–4.500)

## 2013-10-29 LAB — HEPATITIS A ANTIBODY, TOTAL: Hep A Total Ab: NONREACTIVE

## 2013-10-29 LAB — LIPID PANEL
CHOLESTEROL: 147 mg/dL (ref 0–200)
HDL: 41 mg/dL (ref >39)
LDL Cholesterol: 90 mg/dL (ref 0–99)
TRIGLYCERIDES: 80 mg/dL (ref <150)
Total CHOL/HDL Ratio: 3.6 Ratio
VLDL: 16 mg/dL (ref 0–40)

## 2013-10-29 LAB — HIV ANTIBODY (ROUTINE TESTING W REFLEX): HIV: NONREACTIVE

## 2013-10-29 LAB — MAGNESIUM: Magnesium: 1.9 mg/dL (ref 1.5–2.5)

## 2013-10-29 LAB — HEPATITIS B CORE ANTIBODY, TOTAL: HEP B C TOTAL AB: NONREACTIVE

## 2013-10-29 LAB — HEPATITIS B SURFACE ANTIBODY,QUALITATIVE: Hep B S Ab: NEGATIVE

## 2013-10-30 LAB — MICROALBUMIN / CREATININE URINE RATIO
Creatinine, Urine: 151 mg/dL
MICROALB UR: 1.25 mg/dL (ref 0.00–1.89)
Microalb Creat Ratio: 8.3 mg/g (ref 0.0–30.0)

## 2013-10-30 LAB — URINALYSIS, MICROSCOPIC ONLY
BACTERIA UA: NONE SEEN
CASTS: NONE SEEN
Crystals: NONE SEEN
Squamous Epithelial / LPF: NONE SEEN

## 2013-10-30 LAB — RPR

## 2013-10-30 LAB — TESTOSTERONE: Testosterone: 1727.18 ng/dL — ABNORMAL HIGH (ref 300–890)

## 2013-10-30 LAB — INSULIN, FASTING: Insulin fasting, serum: 12 u[IU]/mL (ref 3–28)

## 2013-10-30 LAB — VITAMIN D 25 HYDROXY (VIT D DEFICIENCY, FRACTURES): Vit D, 25-Hydroxy: 75 ng/mL (ref 30–89)

## 2013-10-30 LAB — PSA: PSA: 0.34 ng/mL (ref <=4.00)

## 2013-11-01 LAB — TB SKIN TEST
Induration: 0 mm
TB SKIN TEST: NEGATIVE

## 2013-11-01 LAB — HEPATITIS B E ANTIBODY: HEPATITIS BE ANTIBODY: NEGATIVE

## 2013-11-25 ENCOUNTER — Other Ambulatory Visit: Payer: Self-pay | Admitting: Emergency Medicine

## 2014-01-29 ENCOUNTER — Encounter: Payer: Self-pay | Admitting: Physician Assistant

## 2014-01-29 ENCOUNTER — Ambulatory Visit (INDEPENDENT_AMBULATORY_CARE_PROVIDER_SITE_OTHER): Payer: Managed Care, Other (non HMO) | Admitting: Physician Assistant

## 2014-01-29 VITALS — BP 120/78 | HR 68 | Temp 97.9°F | Resp 16 | Ht 77.0 in | Wt 234.0 lb

## 2014-01-29 DIAGNOSIS — E559 Vitamin D deficiency, unspecified: Secondary | ICD-10-CM

## 2014-01-29 DIAGNOSIS — E785 Hyperlipidemia, unspecified: Secondary | ICD-10-CM

## 2014-01-29 DIAGNOSIS — Z79899 Other long term (current) drug therapy: Secondary | ICD-10-CM

## 2014-01-29 DIAGNOSIS — E291 Testicular hypofunction: Secondary | ICD-10-CM

## 2014-01-29 DIAGNOSIS — R030 Elevated blood-pressure reading, without diagnosis of hypertension: Secondary | ICD-10-CM

## 2014-01-29 DIAGNOSIS — E039 Hypothyroidism, unspecified: Secondary | ICD-10-CM

## 2014-01-29 LAB — CBC WITH DIFFERENTIAL/PLATELET
BASOS PCT: 1 % (ref 0–1)
Basophils Absolute: 0.1 10*3/uL (ref 0.0–0.1)
EOS ABS: 0.1 10*3/uL (ref 0.0–0.7)
EOS PCT: 1 % (ref 0–5)
HCT: 41.6 % (ref 39.0–52.0)
HEMOGLOBIN: 14.1 g/dL (ref 13.0–17.0)
Lymphocytes Relative: 34 % (ref 12–46)
Lymphs Abs: 2.2 10*3/uL (ref 0.7–4.0)
MCH: 30.7 pg (ref 26.0–34.0)
MCHC: 33.9 g/dL (ref 30.0–36.0)
MCV: 90.4 fL (ref 78.0–100.0)
MONOS PCT: 12 % (ref 3–12)
Monocytes Absolute: 0.8 10*3/uL (ref 0.1–1.0)
NEUTROS PCT: 52 % (ref 43–77)
Neutro Abs: 3.4 10*3/uL (ref 1.7–7.7)
PLATELETS: 248 10*3/uL (ref 150–400)
RBC: 4.6 MIL/uL (ref 4.22–5.81)
RDW: 14.4 % (ref 11.5–15.5)
WBC: 6.5 10*3/uL (ref 4.0–10.5)

## 2014-01-29 NOTE — Progress Notes (Signed)
Assessment and Plan:  Hypertension: Continue medication, monitor blood pressure at home. Continue DASH diet. Cholesterol: Continue diet and exercise. Check cholesterol. Increase veggies, decrease meat, and we can try to decrease lipitor to 1/2 M,W,F Hypogonadism- continue to monitor, states medication is helping with symptoms of low T.  Hypothyroidism-check TSH level, continue medications the same.  Vitamin D Def- check level and continue medications.   Continue diet and meds as discussed. Further disposition pending results of labs.  HPI 53 y.o. male  presents for 3 month follow up with hypertension, hyperlipidemia, prediabetes and vitamin D. His blood pressure has been controlled at home, today their BP is BP: 120/78 mmHg He does not workout but he walks a lot at work and he also goes to the pool during the summer. He denies chest pain, shortness of breath, dizziness.  He is on cholesterol medication, 1/2 of lipitor a day, and he has been listening to [employer] Janus and would like to try to get off his cholesterol med and denies myalgias. His cholesterol is at goal. The cholesterol last visit was:   Lab Results  Component Value Date   CHOL 147 10/29/2013   HDL 41 10/29/2013   LDLCALC 90 10/29/2013   LDLDIRECT 127.3 10/01/2009   TRIG 80 10/29/2013   CHOLHDL 3.6 10/29/2013    Last A1C in the office was:  Lab Results  Component Value Date   HGBA1C 5.6 10/29/2013   Patient is on Vitamin D supplement.   He is on thyroid medication. His medication was not changed last visit. Patient denies nervousness, palpitations and weight changes.  Lab Results  Component Value Date   TSH 1.471 10/29/2013  He is has a history of testosterone deficiency, last shot was friday and is on testosterone replacement. He states that the testosterone helps with his energy, libido, muscle mass.   Current Medications:  Current Outpatient Prescriptions on File Prior to Visit  Medication Sig Dispense Refill  . aspirin 81 MG  tablet Take 81 mg by mouth daily.      Marland Kitchen atorvastatin (LIPITOR) 80 MG tablet Take 80 mg by mouth daily.      . B-D 3CC LUER-LOK SYR 21GX1-1/2 21G X 1-1/2" 3 ML MISC       . Cholecalciferol (VITAMIN D PO) Take 4,000 Int'l Units by mouth daily.      Marland Kitchen levothyroxine (SYNTHROID, LEVOTHROID) 200 MCG tablet TAKE 1 TABLET BY MOUTH EVERY DAY  90 tablet  3  . Omega-3 Fatty Acids (FISH OIL PO) Take by mouth daily.      Marland Kitchen testosterone cypionate (DEPO-TESTOSTERONE) 200 MG/ML injection 2 cc or as directed intramuscular every 14 days - and 3 cc syringes - and 21 G 1" needles  10 mL  3   No current facility-administered medications on file prior to visit.   Medical History:  Past Medical History  Diagnosis Date  . Anxiety   . Other testicular hypofunction   . Hyperlipidemia    Allergies:  Allergies  Allergen Reactions  . Shellfish Allergy Rash     Review of Systems: [X]  = complains of  [ ]  = denies  General: Fatigue [ ]  Fever [ ]  Chills [ ]  Weakness [ ]   Insomnia [ ]  Eyes: Redness [ ]  Blurred vision [ ]  Diplopia [ ]   ENT: Congestion [ ]  Sinus Pain [ ]  Post Nasal Drip [ ]  Sore Throat [ ]  Earache [ ]   Cardiac: Chest pain/pressure [ ]  SOB [ ]  Orthopnea [ ]   Palpitations [ ]   Paroxysmal nocturnal dyspnea[ ]  Claudication [ ]  Edema [ ]   Pulmonary: Cough [ ]  Wheezing[ ]   SOB [ ]   Snoring [ ]   GI: Nausea [ ]  Vomiting[ ]  Dysphagia[ ]  Heartburn[ ]  Abdominal pain [ ]  Constipation [ ] ; Diarrhea [ ] ; BRBPR [ ]  Melena[ ]  GU: Hematuria[ ]  Dysuria [ ]  Nocturia[ ]  Urgency [ ]   Hesitancy [ ]  Discharge [ ]  Neuro: Headaches[ ]  Vertigo[ ]  Paresthesias[ ]  Spasm [ ]  Speech changes [ ]  Incoordination [ ]   Ortho: Arthritis [ ]  Joint pain [ ]  Muscle pain [ ]  Joint swelling [ ]  Back Pain [ ]  Skin:  Rash [ ]   Pruritis [ ]  Change in skin lesion [ ]   Psych: Depression[ ]  Anxiety[ ]  Confusion [ ]  Memory loss [ ]   Heme/Lypmh: Bleeding [ ]  Bruising [ ]  Enlarged lymph nodes [ ]   Endocrine: Visual blurring [ ]  Paresthesia [ ]   Polyuria [ ]  Polydypsea [ ]    Heat/cold intolerance [ ]  Hypoglycemia [ ]   Family history- Review and unchanged Social history- Review and unchanged Physical Exam: BP 120/78  Pulse 68  Temp(Src) 97.9 F (36.6 C)  Resp 16  Ht 6\' 5"  (1.956 m)  Wt 234 lb (106.142 kg)  BMI 27.74 kg/m2 Wt Readings from Last 3 Encounters:  01/29/14 234 lb (106.142 kg)  10/29/13 232 lb 3.2 oz (105.325 kg)  01/09/10 229 lb (103.874 kg)   General Appearance: Well nourished, in no apparent distress. Eyes: PERRLA, EOMs, conjunctiva no swelling or erythema Sinuses: No Frontal/maxillary tenderness ENT/Mouth: Ext aud canals clear, TMs without erythema, bulging. No erythema, swelling, or exudate on post pharynx.  Tonsils not swollen or erythematous. Hearing normal.  Neck: Supple, thyroid normal.  Respiratory: Respiratory effort normal, BS equal bilaterally without rales, rhonchi, wheezing or stridor.  Cardio: RRR with no MRGs. Brisk peripheral pulses without edema.  Abdomen: Soft, + BS.  Non tender, no guarding, rebound, hernias, masses. Lymphatics: Non tender without lymphadenopathy.  Musculoskeletal: Full ROM, 5/5 strength, normal gait.  Skin: Warm, dry without rashes, lesions, ecchymosis.  Neuro: Cranial nerves intact. Normal muscle tone, no cerebellar symptoms. Sensation intact.  Psych: Awake and oriented X 3, normal affect, Insight and Judgment appropriate.    Vicie Mutters 9:56 AM

## 2014-01-29 NOTE — Patient Instructions (Signed)
Decrease the lipitor to 1/2 pill M,W,F  [employer]  Fat and Cholesterol Control Diet Fat and cholesterol levels in your blood and organs are influenced by your diet. High levels of fat and cholesterol may lead to diseases of the heart, small and large blood vessels, gallbladder, liver, and pancreas. CONTROLLING FAT AND CHOLESTEROL WITH DIET Although exercise and lifestyle factors are important, your diet is key. That is because certain foods are known to raise cholesterol and others to lower it. The goal is to balance foods for their effect on cholesterol and more importantly, to replace saturated and trans fat with other types of fat, such as monounsaturated fat, polyunsaturated fat, and omega-3 fatty acids. On average, a person should consume no more than 15 to 17 g of saturated fat daily. Saturated and trans fats are considered "bad" fats, and they will raise LDL cholesterol. Saturated fats are primarily found in animal products such as meats, butter, and cream. However, that does not mean you need to give up all your favorite foods. Today, there are good tasting, low-fat, low-cholesterol substitutes for most of the things you like to eat. Choose low-fat or nonfat alternatives. Choose round or loin cuts of red meat. These types of cuts are lowest in fat and cholesterol. Chicken (without the skin), fish, veal, and ground [place] breast are great choices. Eliminate fatty meats, such as hot dogs and salami. Even shellfish have little or no saturated fat. Have a 3 oz (85 g) portion when you eat lean meat, poultry, or fish. Trans fats are also called "partially hydrogenated oils." They are oils that have been scientifically manipulated so that they are solid at room temperature resulting in a longer shelf life and improved taste and texture of foods in which they are added. Trans fats are found in stick margarine, some tub margarines, cookies, crackers, and baked goods.  When baking and cooking, oils  are a great substitute for butter. The monounsaturated oils are especially beneficial since it is believed they lower LDL and raise HDL. The oils you should avoid entirely are saturated tropical oils, such as coconut and palm.  Remember to eat a lot from food groups that are naturally free of saturated and trans fat, including fish, fruit, vegetables, beans, grains (barley, rice, couscous, bulgur wheat), and pasta (without cream sauces).  IDENTIFYING FOODS THAT LOWER FAT AND CHOLESTEROL  Soluble fiber may lower your cholesterol. This type of fiber is found in fruits such as apples, vegetables such as broccoli, potatoes, and carrots, legumes such as beans, peas, and lentils, and grains such as barley. Foods fortified with plant sterols (phytosterol) may also lower cholesterol. You should eat at least 2 g per day of these foods for a cholesterol lowering effect.  Read package labels to identify low-saturated fats, trans fat free, and low-fat foods at the supermarket. Select cheeses that have only 2 to 3 g saturated fat per ounce. Use a heart-healthy tub margarine that is free of trans fats or partially hydrogenated oil. When buying baked goods (cookies, crackers), avoid partially hydrogenated oils. Breads and muffins should be made from whole grains (whole-wheat or whole oat flour, instead of "flour" or "enriched flour"). Buy non-creamy canned soups with reduced salt and no added fats.  FOOD PREPARATION TECHNIQUES  Never deep-fry. If you must fry, either stir-fry, which uses very little fat, or use non-stick cooking sprays. When possible, broil, bake, or roast meats, and steam vegetables. Instead of putting butter or margarine on vegetables, use lemon  and herbs, applesauce, and cinnamon (for squash and sweet potatoes). Use nonfat yogurt, salsa, and low-fat dressings for salads.  LOW-SATURATED FAT / LOW-FAT FOOD SUBSTITUTES Meats / Saturated Fat (g)  Avoid: Steak, marbled (3 oz/85 g) / 11 g  Choose: Steak,  lean (3 oz/85 g) / 4 g  Avoid: Hamburger (3 oz/85 g) / 7 g  Choose: Hamburger, lean (3 oz/85 g) / 5 g  Avoid: Ham (3 oz/85 g) / 6 g  Choose: Ham, lean cut (3 oz/85 g) / 2.4 g  Avoid: Chicken, with skin, dark meat (3 oz/85 g) / 4 g  Choose: Chicken, skin removed, dark meat (3 oz/85 g) / 2 g  Avoid: Chicken, with skin, light meat (3 oz/85 g) / 2.5 g  Choose: Chicken, skin removed, light meat (3 oz/85 g) / 1 g Dairy / Saturated Fat (g)  Avoid: Whole milk (1 cup) / 5 g  Choose: Low-fat milk, 2% (1 cup) / 3 g  Choose: Low-fat milk, 1% (1 cup) / 1.5 g  Choose: Skim milk (1 cup) / 0.3 g  Avoid: Hard cheese (1 oz/28 g) / 6 g  Choose: Skim milk cheese (1 oz/28 g) / 2 to 3 g  Avoid: Cottage cheese, 4% fat (1 cup) / 6.5 g  Choose: Low-fat cottage cheese, 1% fat (1 cup) / 1.5 g  Avoid: Ice cream (1 cup) / 9 g  Choose: Sherbet (1 cup) / 2.5 g  Choose: Nonfat frozen yogurt (1 cup) / 0.3 g  Choose: Frozen fruit bar / trace  Avoid: Whipped cream (1 tbs) / 3.5 g  Choose: Nondairy whipped topping (1 tbs) / 1 g Condiments / Saturated Fat (g)  Avoid: Mayonnaise (1 tbs) / 2 g  Choose: Low-fat mayonnaise (1 tbs) / 1 g  Avoid: Butter (1 tbs) / 7 g  Choose: Extra light margarine (1 tbs) / 1 g  Avoid: Coconut oil (1 tbs) / 11.8 g  Choose: Olive oil (1 tbs) / 1.8 g  Choose: Corn oil (1 tbs) / 1.7 g  Choose: Safflower oil (1 tbs) / 1.2 g  Choose: Sunflower oil (1 tbs) / 1.4 g  Choose: Soybean oil (1 tbs) / 2.4 g  Choose: Canola oil (1 tbs) / 1 g Document Released: 08/16/2005 Document Revised: 12/11/2012 Document Reviewed: 02/04/2011 [employer] Patient Information 2014 [place], [place].

## 2014-01-30 LAB — BASIC METABOLIC PANEL WITH GFR
BUN: 27 mg/dL — ABNORMAL HIGH (ref 6–23)
CALCIUM: 8.9 mg/dL (ref 8.4–10.5)
CO2: 26 mEq/L (ref 19–32)
CREATININE: 1.1 mg/dL (ref 0.50–1.35)
Chloride: 104 mEq/L (ref 96–112)
GFR, EST AFRICAN AMERICAN: 88 mL/min
GFR, Est Non African American: 76 mL/min
GLUCOSE: 96 mg/dL (ref 70–99)
Potassium: 4.1 mEq/L (ref 3.5–5.3)
SODIUM: 139 meq/L (ref 135–145)

## 2014-01-30 LAB — LIPID PANEL
CHOLESTEROL: 120 mg/dL (ref 0–200)
HDL: 26 mg/dL — ABNORMAL LOW (ref >39)
LDL Cholesterol: 65 mg/dL (ref 0–99)
TRIGLYCERIDES: 145 mg/dL (ref <150)
Total CHOL/HDL Ratio: 4.6 Ratio
VLDL: 29 mg/dL (ref 0–40)

## 2014-01-30 LAB — HEPATIC FUNCTION PANEL
ALBUMIN: 4 g/dL (ref 3.5–5.2)
ALT: 18 U/L (ref 0–53)
AST: 18 U/L (ref 0–37)
Alkaline Phosphatase: 64 U/L (ref 39–117)
BILIRUBIN DIRECT: 0.1 mg/dL (ref 0.0–0.3)
Indirect Bilirubin: 0.5 mg/dL (ref 0.2–1.2)
TOTAL PROTEIN: 6.5 g/dL (ref 6.0–8.3)
Total Bilirubin: 0.6 mg/dL (ref 0.2–1.2)

## 2014-01-30 LAB — VITAMIN D 25 HYDROXY (VIT D DEFICIENCY, FRACTURES): VIT D 25 HYDROXY: 67 ng/mL (ref 30–89)

## 2014-01-30 LAB — TSH: TSH: 0.291 u[IU]/mL — ABNORMAL LOW (ref 0.350–4.500)

## 2014-01-30 LAB — MAGNESIUM: MAGNESIUM: 2 mg/dL (ref 1.5–2.5)

## 2014-03-04 ENCOUNTER — Other Ambulatory Visit: Payer: Self-pay

## 2014-04-10 ENCOUNTER — Other Ambulatory Visit: Payer: Self-pay | Admitting: Internal Medicine

## 2014-05-02 ENCOUNTER — Ambulatory Visit: Payer: Self-pay | Admitting: Internal Medicine

## 2014-05-16 ENCOUNTER — Ambulatory Visit: Payer: Managed Care, Other (non HMO) | Admitting: Internal Medicine

## 2014-05-16 DIAGNOSIS — E559 Vitamin D deficiency, unspecified: Secondary | ICD-10-CM | POA: Insufficient documentation

## 2014-05-16 NOTE — Progress Notes (Signed)
Patient ID: Daniel Case, male   DOB: 28-Feb-1961, 53 y.o.   MRN: [id number] [name]

## 2014-09-30 ENCOUNTER — Encounter: Payer: Self-pay | Admitting: Internal Medicine

## 2014-10-29 NOTE — Progress Notes (Signed)
Patient ID: Daniel Case, male   DOB: 1961-08-18, 54 y.o.   MRN: [id number]  Annual Comprehensive Examination  This very nice 54 y.o. MWM presents for complete physical.  Patient has been followed for labile HTN, Prediabetes, Hyperlipidemia, and Vitamin D Deficiency. Also patient is on CPAP for OSA and reports improved sense of well being since on Tx.    Patient has hx/o labile hx predating since 2013 and has been followed expectantly. Patient's BP has been controlled at home.Today's BP: 124/82 mmHg. Patient denies any cardiac symptoms as chest pain, palpitations, shortness of breath, dizziness or ankle swelling.   Patient's hyperlipidemia is controlled with diet and medications, BUT patient has d/c'd his meds to see if he can control with diet.  Last lipids were Total Chol  120; HDL  26; LDL  65; Trig 145 on 01/29/14.   Patient has  prediabetes since 2011 with an A1c 5.7%  and patient denies reactive hypoglycemic symptoms, visual blurring, diabetic polys or paresthesias. Last A1c was 5.6% on 10/29/13.     Patient has Testosterone Deficiency and has been on parenteral therapy and his last dose was 5 weeks ago in anticipation of today's level to seel if he might stop his meds. Finally, patient has history of Vitamin D Deficiency of  6 in Nov 2011  and last vitamin D was 67 on 01/29/14   Medication Sig  . aspirin 81 MG tablet Take 81 mg by mouth daily.  Marland Kitchen atorvastatin  80 MG tablet Take 80 mg by mouth daily.  -  OFF  . VITAMIN D  Take 4,000 Int'l Units by mouth daily.  Marland Kitchen levothyroxine  200 MCG tablet TAKE 1 TABLET BY MOUTH EVERY DAY  . Omega-3 Fatty Acids (FISH OIL PO) Take by mouth daily.  Marland Kitchen DEPO-TESTOTERONE  200 MG/ML inj INJECT 2 ML IM EVERY 14 DAYS   Allergies  Allergen Reactions  . Shellfish Allergy Rash   Past Medical History  Diagnosis Date  . Anxiety   . Other testicular hypofunction   . Hyperlipidemia    Health Maintenance  Topic Date Due  . COLONOSCOPY  05/19/2010  . INFLUENZA  VACCINE  03/30/2014  . TETANUS/TDAP  10/30/2023  . HIV Screening  Completed   Immunization History  Administered Date(s) Administered  . Influenza Split 06/12/2013  . Influenza Whole 06/30/2009  . PPD Test 10/29/2013, 10/30/2014  . Td 01/08/2004  . Tdap 10/29/2013   Past Surgical History  Procedure Laterality Date  . Thyroidectomy  2001   Family History  Problem Relation Age of Onset  . Diabetes Father   . Hypertension Father   . Diabetes Sister     History   Social History  . Marital Status: Married    Spouse Name: N/A  . Number of Children: N/A  . Years of Education: N/A   Occupational History  . Not on file.   Social History Main Topics  . Smoking status: Former Smoker    Types: Cigars  . Smokeless tobacco: Not on file     Comment: Patient states he quit since last physical  . Alcohol Use: 7.0 oz/week    14 drink(s) per week     Comment: occ  . Drug Use: No  . Sexual Activity: Not on file    ROS Constitutional: Denies fever, chills, weight loss/gain, headaches, insomnia, fatigue, night sweats or change in appetite. Eyes: Denies redness, blurred vision, diplopia, discharge, itchy or watery eyes.  ENT: Denies discharge, congestion, post nasal drip, epistaxis,  sore throat, earache, hearing loss, dental pain, Tinnitus, Vertigo, Sinus pain or snoring.  Cardio: Denies chest pain, palpitations, irregular heartbeat, syncope, dyspnea, diaphoresis, orthopnea, PND, claudication or edema Respiratory: denies cough, dyspnea, DOE, pleurisy, hoarseness, laryngitis or wheezing.  Gastrointestinal: Denies dysphagia, heartburn, reflux, water brash, pain, cramps, nausea, vomiting, bloating, diarrhea, constipation, hematemesis, melena, hematochezia, jaundice or hemorrhoids Genitourinary: Denies dysuria, frequency, urgency, nocturia, hesitancy, discharge, hematuria or flank pain Musculoskeletal: Denies arthralgia, myalgia, stiffness, Jt. Swelling, pain, limp or strain/sprain. Denies  Falls. Skin: Denies puritis, rash, hives, warts, acne, eczema or change in skin lesion Neuro: No weakness, tremor, incoordination, spasms, paresthesia or pain Psychiatric: Denies confusion, memory loss or sensory loss. Denies Depression. Endocrine: Denies change in weight, skin, hair change, nocturia, and paresthesia, diabetic polys, visual blurring or hyper / hypo glycemic episodes.  Heme/Lymph: No excessive bleeding, bruising or enlarged lymph nodes.  Physical Exam  BP 124/82   Pulse 72  Temp 97.3 F   Resp 16  Ht 6' 5.5"   Wt 229 lb     BMI 26.79   General Appearance: Well nourished, in no apparent distress. Eyes: PERRLA, EOMs, conjunctiva no swelling or erythema, normal fundi and vessels. Sinuses: No frontal/maxillary tenderness ENT/Mouth: EACs patent / TMs  nl. Nares clear without erythema, swelling, mucoid exudates. Oral hygiene is good. No erythema, swelling, or exudate. Tongue normal, non-obstructing. Tonsils not swollen or erythematous. Hearing normal.  Neck: Supple, thyroid normal. No bruits, nodes or JVD. Respiratory: Respiratory effort normal.  BS equal and clear bilateral without rales, rhonci, wheezing or stridor. Cardio: Heart sounds are normal with regular rate and rhythm and no murmurs, rubs or gallops. Peripheral pulses are normal and equal bilaterally without edema. No aortic or femoral bruits. Chest: symmetric with normal excursions and percussion.  Abdomen: Flat, soft, with bowl sounds. Nontender, no guarding, rebound, hernias, masses, or organomegaly.  Lymphatics: Non tender without lymphadenopathy.  Genitourinary: No hernias.Testes nl. DRE - prostate nl for age - smooth & firm w/o nodules. Musculoskeletal: Full ROM all peripheral extremities, joint stability, 5/5 strength, and normal gait. Skin: Warm and dry without rashes, lesions, cyanosis, clubbing or  ecchymosis.  Neuro: Cranial nerves intact, reflexes equal bilaterally. Normal muscle tone, no cerebellar  symptoms. Sensation intact.  Pysch: Awake and oriented X 3 with normal affect, insight and judgment appropriate.   Assessment and Plan  1. Elevated blood pressure reading without diagnosis of hypertension  - Microalbumin / creatinine urine ratio - EKG 12-Lead - [place], RETROPERITNL ABD,  LTD  2. Hyperlipidemia  - Lipid panel  3. Prediabetes  - Hemoglobin A1c - Insulin, fasting  4. Vitamin D deficiency  - Vit D  25 hydroxy (rtn osteoporosis monitoring)  5. Hypothyroidism, unspecified hypothyroidism type   6. Testosterone deficiency  - Testosterone  7. Screening for rectal cancer  - POC Hemoccult Bld/Stl (3-Cd Home Screen); Future  8. Prostate cancer screening  - PSA  9. Other fatigue  - Vitamin B12 - Iron and TIBC - TSH  10. Screening examination for pulmonary tuberculosis  - PPD  11. OSA on CPAP   12. Medication management  - Urine Microscopic - CBC with Differential/Platelet - BASIC METABOLIC PANEL WITH GFR - Hepatic function panel - Magnesium   Continue prudent diet as discussed, weight control, BP monitoring, regular exercise, and medications as discussed.  Discussed med effects and SE's. Routine screening labs and tests as requested with regular follow-up as recommended.

## 2014-10-29 NOTE — Patient Instructions (Signed)

## 2014-10-30 ENCOUNTER — Ambulatory Visit (INDEPENDENT_AMBULATORY_CARE_PROVIDER_SITE_OTHER): Payer: 59 | Admitting: Internal Medicine

## 2014-10-30 ENCOUNTER — Encounter: Payer: Self-pay | Admitting: Internal Medicine

## 2014-10-30 VITALS — BP 124/82 | HR 72 | Temp 97.3°F | Resp 16 | Ht 77.5 in | Wt 229.0 lb

## 2014-10-30 DIAGNOSIS — E785 Hyperlipidemia, unspecified: Secondary | ICD-10-CM

## 2014-10-30 DIAGNOSIS — R7309 Other abnormal glucose: Secondary | ICD-10-CM

## 2014-10-30 DIAGNOSIS — G4733 Obstructive sleep apnea (adult) (pediatric): Secondary | ICD-10-CM

## 2014-10-30 DIAGNOSIS — E039 Hypothyroidism, unspecified: Secondary | ICD-10-CM

## 2014-10-30 DIAGNOSIS — E559 Vitamin D deficiency, unspecified: Secondary | ICD-10-CM

## 2014-10-30 DIAGNOSIS — Z79899 Other long term (current) drug therapy: Secondary | ICD-10-CM

## 2014-10-30 DIAGNOSIS — Z9989 Dependence on other enabling machines and devices: Secondary | ICD-10-CM

## 2014-10-30 DIAGNOSIS — Z125 Encounter for screening for malignant neoplasm of prostate: Secondary | ICD-10-CM

## 2014-10-30 DIAGNOSIS — E349 Endocrine disorder, unspecified: Secondary | ICD-10-CM

## 2014-10-30 DIAGNOSIS — R7303 Prediabetes: Secondary | ICD-10-CM

## 2014-10-30 DIAGNOSIS — R030 Elevated blood-pressure reading, without diagnosis of hypertension: Secondary | ICD-10-CM

## 2014-10-30 DIAGNOSIS — Z1212 Encounter for screening for malignant neoplasm of rectum: Secondary | ICD-10-CM

## 2014-10-30 DIAGNOSIS — Z111 Encounter for screening for respiratory tuberculosis: Secondary | ICD-10-CM

## 2014-10-30 DIAGNOSIS — R5383 Other fatigue: Secondary | ICD-10-CM

## 2014-10-30 LAB — CBC WITH DIFFERENTIAL/PLATELET
BASOS ABS: 0 10*3/uL (ref 0.0–0.1)
BASOS PCT: 1 % (ref 0–1)
EOS ABS: 0.1 10*3/uL (ref 0.0–0.7)
Eosinophils Relative: 2 % (ref 0–5)
HCT: 44.8 % (ref 39.0–52.0)
HEMOGLOBIN: 15.4 g/dL (ref 13.0–17.0)
Lymphocytes Relative: 43 % (ref 12–46)
Lymphs Abs: 2.1 10*3/uL (ref 0.7–4.0)
MCH: 31.6 pg (ref 26.0–34.0)
MCHC: 34.4 g/dL (ref 30.0–36.0)
MCV: 92 fL (ref 78.0–100.0)
MPV: 9.1 fL (ref 8.6–12.4)
Monocytes Absolute: 0.5 10*3/uL (ref 0.1–1.0)
Monocytes Relative: 11 % (ref 3–12)
Neutro Abs: 2.1 10*3/uL (ref 1.7–7.7)
Neutrophils Relative %: 43 % (ref 43–77)
PLATELETS: 271 10*3/uL (ref 150–400)
RBC: 4.87 MIL/uL (ref 4.22–5.81)
RDW: 14.1 % (ref 11.5–15.5)
WBC: 4.9 10*3/uL (ref 4.0–10.5)

## 2014-10-30 LAB — HEMOGLOBIN A1C
Hgb A1c MFr Bld: 6.6 % — ABNORMAL HIGH (ref <5.7)
Mean Plasma Glucose: 143 mg/dL — ABNORMAL HIGH (ref <117)

## 2014-10-31 LAB — HEPATIC FUNCTION PANEL
ALK PHOS: 77 U/L (ref 39–117)
ALT: 23 U/L (ref 0–53)
AST: 18 U/L (ref 0–37)
Albumin: 4.5 g/dL (ref 3.5–5.2)
BILIRUBIN DIRECT: 0.1 mg/dL (ref 0.0–0.3)
Indirect Bilirubin: 0.4 mg/dL (ref 0.2–1.2)
Total Bilirubin: 0.5 mg/dL (ref 0.2–1.2)
Total Protein: 7.1 g/dL (ref 6.0–8.3)

## 2014-10-31 LAB — BASIC METABOLIC PANEL WITH GFR
BUN: 23 mg/dL (ref 6–23)
CALCIUM: 9.5 mg/dL (ref 8.4–10.5)
CO2: 27 mEq/L (ref 19–32)
CREATININE: 1.11 mg/dL (ref 0.50–1.35)
Chloride: 106 mEq/L (ref 96–112)
GFR, Est African American: 87 mL/min
GFR, Est Non African American: 75 mL/min
GLUCOSE: 99 mg/dL (ref 70–99)
Potassium: 4.4 mEq/L (ref 3.5–5.3)
Sodium: 141 mEq/L (ref 135–145)

## 2014-10-31 LAB — MICROALBUMIN / CREATININE URINE RATIO
Creatinine, Urine: 269 mg/dL
Microalb Creat Ratio: 10 mg/g (ref 0.0–30.0)
Microalb, Ur: 2.7 mg/dL — ABNORMAL HIGH (ref <2.0)

## 2014-10-31 LAB — URINALYSIS, MICROSCOPIC ONLY
Bacteria, UA: NONE SEEN
CASTS: NONE SEEN
SQUAMOUS EPITHELIAL / LPF: NONE SEEN

## 2014-10-31 LAB — MAGNESIUM: Magnesium: 2.1 mg/dL (ref 1.5–2.5)

## 2014-10-31 LAB — LIPID PANEL
CHOLESTEROL: 225 mg/dL — AB (ref 0–200)
HDL: 38 mg/dL — AB (ref >=40)
LDL CALC: 159 mg/dL — AB (ref 0–99)
TRIGLYCERIDES: 139 mg/dL (ref <150)
Total CHOL/HDL Ratio: 5.9 Ratio
VLDL: 28 mg/dL (ref 0–40)

## 2014-10-31 LAB — VITAMIN B12: VITAMIN B 12: 385 pg/mL (ref 211–911)

## 2014-10-31 LAB — INSULIN, FASTING: Insulin fasting, serum: 6.4 u[IU]/mL (ref 2.0–19.6)

## 2014-10-31 LAB — VITAMIN D 25 HYDROXY (VIT D DEFICIENCY, FRACTURES): Vit D, 25-Hydroxy: 47 ng/mL (ref 30–100)

## 2014-10-31 LAB — IRON AND TIBC
%SAT: 33 % (ref 20–55)
Iron: 95 ug/dL (ref 42–165)
TIBC: 288 ug/dL (ref 215–435)
UIBC: 193 ug/dL (ref 125–400)

## 2014-10-31 LAB — TESTOSTERONE: Testosterone: 277 ng/dL — ABNORMAL LOW (ref 300–890)

## 2014-10-31 LAB — PSA: PSA: 0.23 ng/mL (ref <=4.00)

## 2014-10-31 LAB — TSH: TSH: 40.955 u[IU]/mL — ABNORMAL HIGH (ref 0.350–4.500)

## 2014-11-07 ENCOUNTER — Other Ambulatory Visit: Payer: Self-pay

## 2014-11-07 ENCOUNTER — Other Ambulatory Visit: Payer: Self-pay | Admitting: Internal Medicine

## 2014-11-07 MED ORDER — TESTOSTERONE 20.25 MG/ACT (1.62%) TD GEL: TRANSDERMAL | Status: DC

## 2014-11-11 ENCOUNTER — Other Ambulatory Visit: Payer: Self-pay | Admitting: Emergency Medicine

## 2014-11-11 ENCOUNTER — Other Ambulatory Visit: Payer: Self-pay | Admitting: *Deleted

## 2014-11-11 MED ORDER — TESTOSTERONE 50 MG/5GM (1%) TD GEL: 5.0000 g | Freq: Every day | TRANSDERMAL | Status: DC

## 2014-11-13 ENCOUNTER — Other Ambulatory Visit (INDEPENDENT_AMBULATORY_CARE_PROVIDER_SITE_OTHER): Payer: 59

## 2014-11-13 DIAGNOSIS — Z1211 Encounter for screening for malignant neoplasm of colon: Secondary | ICD-10-CM

## 2014-11-13 DIAGNOSIS — Z1212 Encounter for screening for malignant neoplasm of rectum: Secondary | ICD-10-CM

## 2014-11-13 LAB — POC HEMOCCULT BLD/STL (HOME/3-CARD/SCREEN)
FECAL OCCULT BLD: NEGATIVE
FECAL OCCULT BLD: NEGATIVE
Fecal Occult Blood, POC: NEGATIVE

## 2014-11-13 LAB — TB SKIN TEST
Induration: 0 mm
TB Skin Test: NEGATIVE

## 2014-12-10 ENCOUNTER — Other Ambulatory Visit: Payer: 59

## 2014-12-10 ENCOUNTER — Other Ambulatory Visit: Payer: Self-pay | Admitting: Internal Medicine

## 2014-12-10 DIAGNOSIS — E039 Hypothyroidism, unspecified: Secondary | ICD-10-CM

## 2014-12-10 LAB — TSH: TSH: 0.216 u[IU]/mL — AB (ref 0.350–4.500)

## 2015-01-07 ENCOUNTER — Other Ambulatory Visit: Payer: Self-pay

## 2015-01-07 MED ORDER — LEVOTHYROXINE SODIUM 200 MCG PO TABS
200.0000 ug | ORAL_TABLET | Freq: Every day | ORAL | Status: DC
Start: 2015-01-07 — End: 2015-09-30

## 2015-01-07 MED ORDER — ATORVASTATIN CALCIUM 80 MG PO TABS: 80.0000 mg | ORAL_TABLET | Freq: Every day | ORAL | Status: DC

## 2015-01-07 MED ORDER — TESTOSTERONE 50 MG/5GM (1%) TD GEL: 5.0000 g | Freq: Every day | TRANSDERMAL | Status: DC

## 2015-01-20 ENCOUNTER — Ambulatory Visit (INDEPENDENT_AMBULATORY_CARE_PROVIDER_SITE_OTHER): Payer: 59 | Admitting: Internal Medicine

## 2015-01-20 ENCOUNTER — Encounter: Payer: Self-pay | Admitting: Internal Medicine

## 2015-01-20 VITALS — BP 144/92 | HR 68 | Temp 97.7°F | Resp 18 | Ht 77.5 in | Wt 228.6 lb

## 2015-01-20 DIAGNOSIS — J0140 Acute pansinusitis, unspecified: Secondary | ICD-10-CM

## 2015-01-20 DIAGNOSIS — H65193 Other acute nonsuppurative otitis media, bilateral: Secondary | ICD-10-CM

## 2015-01-20 DIAGNOSIS — H109 Unspecified conjunctivitis: Secondary | ICD-10-CM

## 2015-01-20 MED ORDER — LEVOFLOXACIN 500 MG PO TABS: ORAL_TABLET | ORAL | Status: DC

## 2015-01-20 MED ORDER — PREDNISONE 20 MG PO TABS: ORAL_TABLET | ORAL | Status: DC

## 2015-01-20 MED ORDER — NEOMYCIN-POLYMYXIN-DEXAMETH 3.5-10000-0.1 OP SUSP: OPHTHALMIC | Status: DC

## 2015-01-20 NOTE — Progress Notes (Signed)
   Subjective:    Patient ID: Daniel Case, male    DOB: 1961-02-22, 54 y.o.   MRN: [id number]  HPI Pt presents with 1 week hx/o a head cold with head congestion - sinus stuffiness, pressure and yellowish green mucopurulent nasal secretions and 3 day hx/o red eyes and crusted shut in the mornings and feeling as though there is crystals or sand in his eyes.  Medication Sig  . aspirin 81 MG tablet Take 81 mg by mouth daily.  Marland Kitchen atorvastatin (LIPITOR) 80 MG tablet Take 1 tablet (80 mg total) by mouth at bedtime.  . Cholecalciferol (VITAMIN D PO) Take 4,000 Int'l Units by mouth daily.  Marland Kitchen levothyroxine (SYNTHROID, LEVOTHROID) 200 MCG tablet Take 1 tablet (200 mcg total) by mouth daily.  . Omega-3 Fatty Acids (FISH OIL PO) Take by mouth daily.  Marland Kitchen testosterone (TESTIM) 50 MG/5GM (1%) GEL Place 5 g onto the skin daily.    Allergies  Allergen Reactions  . Shellfish Allergy Rash   Past Medical History  Diagnosis Date  . Anxiety   . Other testicular hypofunction   . Hyperlipidemia    Review of Systems 10 point systems review negative except as above.    Objective:   Physical Exam  BP 144/92   Pulse 68  Temp 97.7 F   Resp 18  Ht 6' 5.5"   Wt 228 lb 9.6 oz    BMI 26.75   HEENT - Eac's patent and TM's dull, pink with splayed reflex.  EOM's full. PERRLA. Bilat maxillary and frontal tenderness. NasoOroPharynx 2+ injected w/o exudates. No palpable pre-auricular LN's. Neck - supple. Nl Thyroid. Carotids 2+ & No bruits, nodes, JVD Chest - Clear equal BS w/o Rales, rhonchi, wheezes. Cor - Nl HS. RRR w/o sig MGR. PP 1(+). No edema. Abd - No palpable organomegaly, masses or tenderness. BS nl. MS- FROM w/o deformities. Muscle power, tone and bulk Nl. Gait Nl. Neuro - No obvious Cr N abnormalities. Sensory, motor and Cerebellar functions appear Nl w/o focal abnormalities.    Assessment & Plan:   1. Acute pansinusitis, recurrence not specified  - levofloxacin (LEVAQUIN) 500 MG tablet; Take 1  tablet daily with food for infection  Dispense: 15 tablet; Refill: 1 - Prednisone 20 mg #20 pulse/taper  2. Acute otitis media of both ears   3. Bilateral conjunctivitis  - neomycin-polymyxin b-dexamethasone (MAXITROL) 3.5-10000-0.1 SUSP; 1 to 2 drops to affected eye(s) 4 x day  Dispense: 5 mL; Refill: 1

## 2015-01-20 NOTE — Patient Instructions (Addendum)
Please get a decongestant like  "Sudafed- PE"   with phenylephrine   ++++++++++++++++++++++++++++++++++  Bacterial Conjunctivitis Bacterial conjunctivitis, commonly called pink eye, is an inflammation of the clear membrane that covers the white part of the eye (conjunctiva). The inflammation can also happen on the underside of the eyelids. The blood vessels in the conjunctiva become inflamed, causing the eye to become red or pink. Bacterial conjunctivitis may spread easily from one eye to another and from person to person (contagious).  CAUSES  Bacterial conjunctivitis is caused by bacteria. The bacteria may come from your own skin, your upper respiratory tract, or from someone else with bacterial conjunctivitis. SYMPTOMS  The normally white color of the eye or the underside of the eyelid is usually pink or red. The pink eye is usually associated with irritation, tearing, and some sensitivity to light. Bacterial conjunctivitis is often associated with a thick, yellowish discharge from the eye. The discharge may turn into a crust on the eyelids overnight, which causes your eyelids to stick together. If a discharge is present, there may also be some blurred vision in the affected eye. DIAGNOSIS  Bacterial conjunctivitis is diagnosed by your caregiver through an eye exam and the symptoms that you report. Your caregiver looks for changes in the surface tissues of your eyes, which may point to the specific type of conjunctivitis. A sample of any discharge may be collected on a cotton-tip swab if you have a severe case of conjunctivitis, if your cornea is affected, or if you keep getting repeat infections that do not respond to treatment. The sample will be sent to a lab to see if the inflammation is caused by a bacterial infection and to see if the infection will respond to antibiotic medicines. TREATMENT   Bacterial conjunctivitis is treated with antibiotics. Antibiotic eyedrops are most often used.  However, antibiotic ointments are also available. Antibiotics pills are sometimes used. Artificial tears or eye washes may ease discomfort. HOME CARE INSTRUCTIONS   To ease discomfort, apply a cool, clean washcloth to your eye for 10-20 minutes, 3-4 times a day.  Gently wipe away any drainage from your eye with a warm, wet washcloth or a cotton ball.  Wash your hands often with soap and water. Use paper towels to dry your hands.  Do not share towels or washcloths. This may spread the infection.  Change or wash your pillowcase every day.  You should not use eye makeup until the infection is gone.  Do not operate machinery or drive if your vision is blurred.  Stop using contact lenses. Ask your caregiver how to sterilize or replace your contacts before using them again. This depends on the type of contact lenses that you use.  When applying medicine to the infected eye, do not touch the edge of your eyelid with the eyedrop bottle or ointment tube. SEEK IMMEDIATE MEDICAL CARE IF:   Your infection has not improved within 3 days after beginning treatment.  You had yellow discharge from your eye and it returns.  You have increased eye pain.  Your eye redness is spreading.  Your vision becomes blurred.  You have a fever or persistent symptoms for more than 2-3 days.  You have a fever and your symptoms suddenly get worse.  You have facial pain, redness, or swelling. MAKE SURE YOU:   Understand these instructions.  Will watch your condition.  Will get help right away if you are not doing well or get worse. Document Released: 08/16/2005 Document Revised:  12/31/2013 Document Reviewed: 01/17/2012 Exit[employer] Patient Information 2015 [place], [place]. This information is not intended to replace advice given to you by your health care provider. Make sure you discuss any questions you have with your health care provider.  +++++++++++++++++++++++++++++++++++  Sinusitis Sinusitis is  redness, soreness, and inflammation of the paranasal sinuses. Paranasal sinuses are air pockets within the bones of your face (beneath the eyes, the middle of the forehead, or above the eyes). In healthy paranasal sinuses, mucus is able to drain out, and air is able to circulate through them by way of your nose. However, when your paranasal sinuses are inflamed, mucus and air can become trapped. This can allow bacteria and other germs to grow and cause infection. Sinusitis can develop quickly and last only a short time (acute) or continue over a long period (chronic). Sinusitis that lasts for more than 12 weeks is considered chronic.  CAUSES  Causes of sinusitis include:  Allergies.  Structural abnormalities, such as displacement of the cartilage that separates your nostrils (deviated septum), which can decrease the air flow through your nose and sinuses and affect sinus drainage.  Functional abnormalities, such as when the small hairs (cilia) that line your sinuses and help remove mucus do not work properly or are not present. SIGNS AND SYMPTOMS  Symptoms of acute and chronic sinusitis are the same. The primary symptoms are pain and pressure around the affected sinuses. Other symptoms include:  Upper toothache.  Earache.  Headache.  Bad breath.  Decreased sense of smell and taste.  A cough, which worsens when you are lying flat.  Fatigue.  Fever.  Thick drainage from your nose, which often is green and may contain pus (purulent).  Swelling and warmth over the affected sinuses. DIAGNOSIS  Your health care provider will perform a physical exam. During the exam, your health care provider may:  Look in your nose for signs of abnormal growths in your nostrils (nasal polyps).  Tap over the affected sinus to check for signs of infection.  View the inside of your sinuses (endoscopy) using an imaging device that has a light attached (endoscope). If your health care provider suspects  that you have chronic sinusitis, one or more of the following tests may be recommended:  Allergy tests.  Nasal culture. A sample of mucus is taken from your nose, sent to a lab, and screened for bacteria.  Nasal cytology. A sample of mucus is taken from your nose and examined by your health care provider to determine if your sinusitis is related to an allergy. TREATMENT  Most cases of acute sinusitis are related to a viral infection and will resolve on their own within 10 days. Sometimes medicines are prescribed to help relieve symptoms (pain medicine, decongestants, nasal steroid sprays, or saline sprays).  However, for sinusitis related to a bacterial infection, your health care provider will prescribe antibiotic medicines. These are medicines that will help kill the bacteria causing the infection.  Rarely, sinusitis is caused by a fungal infection. In theses cases, your health care provider will prescribe antifungal medicine. For some cases of chronic sinusitis, surgery is needed. Generally, these are cases in which sinusitis recurs more than 3 times per year, despite other treatments. HOME CARE INSTRUCTIONS   Drink plenty of water. Water helps thin the mucus so your sinuses can drain more easily.  Use a humidifier.  Inhale steam 3 to 4 times a day (for example, sit in the bathroom with the shower running).  Apply a warm,  moist washcloth to your face 3 to 4 times a day, or as directed by your health care provider.  Use saline nasal sprays to help moisten and clean your sinuses.  Take medicines only as directed by your health care provider.  If you were prescribed either an antibiotic or antifungal medicine, finish it all even if you start to feel better. SEEK IMMEDIATE MEDICAL CARE IF:  You have increasing pain or severe headaches.  You have nausea, vomiting, or drowsiness.  You have swelling around your face.  You have vision problems.  You have a stiff neck.  You have  difficulty breathing. MAKE SURE YOU:   Understand these instructions.  Will watch your condition.  Will get help right away if you are not doing well or get worse. Document Released: 08/16/2005 Document Revised: 12/31/2013 Document Reviewed: 08/31/2011 [facility] Patient Information 2015 [place], [place]. This information is not intended to replace advice given to you by your health care provider. Make sure you discuss any questions you have with your health care provider.

## 2015-02-02 ENCOUNTER — Other Ambulatory Visit: Payer: Self-pay | Admitting: Physician Assistant

## 2015-02-12 ENCOUNTER — Ambulatory Visit: Payer: 59 | Admitting: Internal Medicine

## 2015-02-12 NOTE — Progress Notes (Signed)
Patient ID: Daniel Case, male   DOB: [birth year], 54 y.o.   MRN: [id number] [name]

## 2015-05-15 ENCOUNTER — Ambulatory Visit: Payer: Self-pay | Admitting: Internal Medicine

## 2015-05-21 ENCOUNTER — Ambulatory Visit: Payer: Self-pay | Admitting: Internal Medicine

## 2015-05-22 ENCOUNTER — Ambulatory Visit (INDEPENDENT_AMBULATORY_CARE_PROVIDER_SITE_OTHER): Payer: 59 | Admitting: Internal Medicine

## 2015-05-22 ENCOUNTER — Encounter: Payer: Self-pay | Admitting: Internal Medicine

## 2015-05-22 VITALS — BP 128/80 | HR 86 | Temp 98.0°F | Resp 18 | Ht 77.5 in | Wt 231.0 lb

## 2015-05-22 DIAGNOSIS — R030 Elevated blood-pressure reading, without diagnosis of hypertension: Secondary | ICD-10-CM

## 2015-05-22 DIAGNOSIS — E559 Vitamin D deficiency, unspecified: Secondary | ICD-10-CM | POA: Diagnosis not present

## 2015-05-22 DIAGNOSIS — E785 Hyperlipidemia, unspecified: Secondary | ICD-10-CM

## 2015-05-22 DIAGNOSIS — E039 Hypothyroidism, unspecified: Secondary | ICD-10-CM

## 2015-05-22 DIAGNOSIS — R7303 Prediabetes: Secondary | ICD-10-CM

## 2015-05-22 DIAGNOSIS — Z79899 Other long term (current) drug therapy: Secondary | ICD-10-CM | POA: Diagnosis not present

## 2015-05-22 DIAGNOSIS — R7309 Other abnormal glucose: Secondary | ICD-10-CM | POA: Diagnosis not present

## 2015-05-22 LAB — LIPID PANEL
CHOLESTEROL: 122 mg/dL — AB (ref 125–200)
HDL: 34 mg/dL — ABNORMAL LOW (ref >=40)
LDL Cholesterol: 53 mg/dL (ref <130)
TRIGLYCERIDES: 177 mg/dL — AB (ref <150)
Total CHOL/HDL Ratio: 3.6 Ratio (ref <=5.0)
VLDL: 35 mg/dL — ABNORMAL HIGH (ref <30)

## 2015-05-22 LAB — CBC WITH DIFFERENTIAL/PLATELET
BASOS ABS: 0.1 10*3/uL (ref 0.0–0.1)
Basophils Relative: 1 % (ref 0–1)
Eosinophils Absolute: 0.1 10*3/uL (ref 0.0–0.7)
Eosinophils Relative: 2 % (ref 0–5)
HEMATOCRIT: 40.9 % (ref 39.0–52.0)
HEMOGLOBIN: 13.8 g/dL (ref 13.0–17.0)
LYMPHS PCT: 37 % (ref 12–46)
Lymphs Abs: 2.1 10*3/uL (ref 0.7–4.0)
MCH: 31.5 pg (ref 26.0–34.0)
MCHC: 33.7 g/dL (ref 30.0–36.0)
MCV: 93.4 fL (ref 78.0–100.0)
MPV: 9.2 fL (ref 8.6–12.4)
Monocytes Absolute: 0.5 10*3/uL (ref 0.1–1.0)
Monocytes Relative: 9 % (ref 3–12)
NEUTROS ABS: 2.9 10*3/uL (ref 1.7–7.7)
NEUTROS PCT: 51 % (ref 43–77)
Platelets: 264 10*3/uL (ref 150–400)
RBC: 4.38 MIL/uL (ref 4.22–5.81)
RDW: 13.5 % (ref 11.5–15.5)
WBC: 5.7 10*3/uL (ref 4.0–10.5)

## 2015-05-22 LAB — HEPATIC FUNCTION PANEL
ALBUMIN: 4.2 g/dL (ref 3.6–5.1)
ALT: 39 U/L (ref 9–46)
AST: 22 U/L (ref 10–35)
Alkaline Phosphatase: 84 U/L (ref 40–115)
BILIRUBIN INDIRECT: 0.4 mg/dL (ref 0.2–1.2)
Bilirubin, Direct: 0.1 mg/dL (ref <=0.2)
TOTAL PROTEIN: 6.6 g/dL (ref 6.1–8.1)
Total Bilirubin: 0.5 mg/dL (ref 0.2–1.2)

## 2015-05-22 LAB — BASIC METABOLIC PANEL WITH GFR
BUN: 17 mg/dL (ref 7–25)
CALCIUM: 9.2 mg/dL (ref 8.6–10.3)
CO2: 25 mmol/L (ref 20–31)
CREATININE: 1.01 mg/dL (ref 0.70–1.33)
Chloride: 102 mmol/L (ref 98–110)
GFR, EST NON AFRICAN AMERICAN: 83 mL/min (ref >=60)
GLUCOSE: 265 mg/dL — AB (ref 65–99)
Potassium: 4.4 mmol/L (ref 3.5–5.3)
Sodium: 138 mmol/L (ref 135–146)

## 2015-05-22 LAB — MAGNESIUM: MAGNESIUM: 1.9 mg/dL (ref 1.5–2.5)

## 2015-05-22 NOTE — Progress Notes (Signed)
Patient ID: Daniel Case, male   DOB: Nov 17, 1960, 54 y.o.   MRN: [id number]  Assessment and Plan:  Hypertension:  -Continue medication,  -monitor blood pressure at home.  -Continue DASH diet.   -Reminder to go to the ER if any CP, SOB, nausea, dizziness, severe HA, changes vision/speech, left arm numbness and tingling, and jaw pain.  Cholesterol: -Continue diet and exercise.  -Check cholesterol.   Pre-diabetes: -Continue diet and exercise.  -Check A1C  Vitamin D Def: -check level -continue medications.   Continue diet and meds as discussed. Further disposition pending results of labs.  HPI 54 y.o. male  presents for 3 month follow up with hypertension, hyperlipidemia, prediabetes and vitamin D.   His blood pressure has been controlled at home, today their BP is BP: 128/80 mmHg.   He does workout. He denies chest pain, shortness of breath, dizziness.   He is on cholesterol medication and denies myalgias. His cholesterol is at goal. The cholesterol last visit was:   Lab Results  Component Value Date   CHOL 225* 10/30/2014   HDL 38* 10/30/2014   LDLCALC 159* 10/30/2014   LDLDIRECT 127.3 10/01/2009   TRIG 139 10/30/2014   CHOLHDL 5.9 10/30/2014     He has been working on diet and exercise for prediabetes, and denies foot ulcerations, hyperglycemia, hypoglycemia , increased appetite, nausea, paresthesia of the feet, polydipsia, polyuria, visual disturbances, vomiting and weight loss. Last A1C in the office was:  Lab Results  Component Value Date   HGBA1C 6.6* 10/30/2014    Patient is on Vitamin D supplement.  Lab Results  Component Value Date   VD25OH 47 10/30/2014     Patient reports that he does take vitamin D but it is less than 30 minutes after taking his synthroid.  He was unaware that if he eats sooner than 30 minutes.    Current Medications:  Current Outpatient Prescriptions on File Prior to Visit  Medication Sig Dispense Refill  . atorvastatin (LIPITOR) 80 MG  tablet Take 1 tablet (80 mg total) by mouth at bedtime. 90 tablet 3  . Cholecalciferol (VITAMIN D PO) Take 4,000 Int'l Units by mouth daily.    Marland Kitchen levothyroxine (SYNTHROID, LEVOTHROID) 200 MCG tablet Take 1 tablet (200 mcg total) by mouth daily. 90 tablet 3  . Omega-3 Fatty Acids (FISH OIL PO) Take by mouth daily.    Marland Kitchen testosterone (TESTIM) 50 MG/5GM (1%) GEL Place 5 g onto the skin daily. 90 Tube 3   No current facility-administered medications on file prior to visit.    Medical History:  Past Medical History  Diagnosis Date  . Anxiety   . Other testicular hypofunction   . Hyperlipidemia     Allergies:  Allergies  Allergen Reactions  . Shellfish Allergy Rash     Review of Systems:  Review of Systems  Constitutional: Negative for fever, chills and malaise/fatigue.  HENT: Negative for congestion, ear pain and sore throat.   Eyes: Negative.   Respiratory: Negative for cough, shortness of breath and wheezing.   Cardiovascular: Negative for chest pain, palpitations and leg swelling.  Gastrointestinal: Negative for heartburn, diarrhea, constipation, blood in stool and melena.  Genitourinary: Negative.   Neurological: Negative for dizziness, sensory change, loss of consciousness and headaches.  Psychiatric/Behavioral: Negative for depression. The patient is not nervous/anxious and does not have insomnia.     Family history- Review and unchanged  Social history- Review and unchanged  Physical Exam: BP 128/80 mmHg  Pulse 86  Temp(Src)  98 F (36.7 C) (Temporal)  Resp 18  Ht 6' 5.5" (1.969 m)  Wt 231 lb (104.781 kg)  BMI 27.03 kg/m2 Wt Readings from Last 3 Encounters:  05/22/15 231 lb (104.781 kg)  01/20/15 228 lb 9.6 oz (103.692 kg)  10/30/14 229 lb (103.874 kg)    General Appearance: Well nourished well developed, in no apparent distress. Eyes: PERRLA, EOMs, conjunctiva no swelling or erythema ENT/Mouth: Ear canals normal without obstruction, swelling, erythma,  discharge.  TMs normal bilaterally.  Oropharynx moist, clear, without exudate, or postoropharyngeal swelling. Neck: Supple, thyroid normal,no cervical adenopathy  Respiratory: Respiratory effort normal, Breath sounds clear A&P without rhonchi, wheeze, or rale.  No retractions, no accessory usage. Cardio: RRR with no MRGs. Brisk peripheral pulses without edema.  Abdomen: Soft, + BS,  Non tender, no guarding, rebound, hernias, masses. Musculoskeletal: Full ROM, 5/5 strength, Normal gait Skin: Warm, dry without rashes, lesions, ecchymosis.  Neuro: Awake and oriented X 3, Cranial nerves intact. Normal muscle tone, no cerebellar symptoms. Psych: Normal affect, Insight and Judgment appropriate.    Starlyn Skeans, PA-C 9:02 AM [facility]

## 2015-05-23 LAB — TSH: TSH: 2.113 u[IU]/mL (ref 0.350–4.500)

## 2015-05-23 LAB — HEMOGLOBIN A1C
Hgb A1c MFr Bld: 7.6 % — ABNORMAL HIGH (ref <5.7)
MEAN PLASMA GLUCOSE: 171 mg/dL — AB (ref <117)

## 2015-05-23 LAB — INSULIN, RANDOM: INSULIN: 20.5 u[IU]/mL — AB (ref 2.0–19.6)

## 2015-05-23 LAB — VITAMIN D 25 HYDROXY (VIT D DEFICIENCY, FRACTURES): VIT D 25 HYDROXY: 35 ng/mL (ref 30–100)

## 2015-09-29 ENCOUNTER — Encounter: Payer: Self-pay | Admitting: Internal Medicine

## 2015-09-29 ENCOUNTER — Ambulatory Visit (INDEPENDENT_AMBULATORY_CARE_PROVIDER_SITE_OTHER): Payer: 59 | Admitting: Internal Medicine

## 2015-09-29 VITALS — BP 132/84 | HR 78 | Temp 98.0°F | Resp 18 | Ht 77.5 in | Wt 217.0 lb

## 2015-09-29 DIAGNOSIS — R109 Unspecified abdominal pain: Secondary | ICD-10-CM | POA: Diagnosis not present

## 2015-09-29 DIAGNOSIS — Z1211 Encounter for screening for malignant neoplasm of colon: Secondary | ICD-10-CM

## 2015-09-29 DIAGNOSIS — E039 Hypothyroidism, unspecified: Secondary | ICD-10-CM | POA: Diagnosis not present

## 2015-09-29 DIAGNOSIS — Z87442 Personal history of urinary calculi: Secondary | ICD-10-CM | POA: Diagnosis not present

## 2015-09-29 LAB — URINALYSIS, MICROSCOPIC ONLY
BACTERIA UA: NONE SEEN [HPF]
Casts: NONE SEEN [LPF]
SQUAMOUS EPITHELIAL / LPF: NONE SEEN [HPF] (ref <=5)
YEAST: NONE SEEN [HPF]

## 2015-09-29 LAB — URINALYSIS, ROUTINE W REFLEX MICROSCOPIC
BILIRUBIN URINE: NEGATIVE
LEUKOCYTES UA: NEGATIVE
Nitrite: NEGATIVE
Protein, ur: NEGATIVE
SPECIFIC GRAVITY, URINE: 1.03 (ref 1.001–1.035)
pH: 6 (ref 5.0–8.0)

## 2015-09-29 LAB — BASIC METABOLIC PANEL WITH GFR
BUN: 19 mg/dL (ref 7–25)
CO2: 25 mmol/L (ref 20–31)
CREATININE: 0.87 mg/dL (ref 0.70–1.33)
Calcium: 9.1 mg/dL (ref 8.6–10.3)
Chloride: 102 mmol/L (ref 98–110)
GFR, Est African American: >89 mL/min (ref >=60)
GFR, Est Non African American: >89 mL/min (ref >=60)
Glucose, Bld: 321 mg/dL — ABNORMAL HIGH (ref 65–99)
POTASSIUM: 3.9 mmol/L (ref 3.5–5.3)
SODIUM: 137 mmol/L (ref 135–146)

## 2015-09-29 LAB — CBC WITH DIFFERENTIAL/PLATELET
BASOS PCT: 1 % (ref 0–1)
Basophils Absolute: 0.1 10*3/uL (ref 0.0–0.1)
Eosinophils Absolute: 0.2 10*3/uL (ref 0.0–0.7)
Eosinophils Relative: 3 % (ref 0–5)
HCT: 41.3 % (ref 39.0–52.0)
HEMOGLOBIN: 13.9 g/dL (ref 13.0–17.0)
Lymphocytes Relative: 37 % (ref 12–46)
Lymphs Abs: 2 10*3/uL (ref 0.7–4.0)
MCH: 31 pg (ref 26.0–34.0)
MCHC: 33.7 g/dL (ref 30.0–36.0)
MCV: 92 fL (ref 78.0–100.0)
MONOS PCT: 10 % (ref 3–12)
MPV: 9.4 fL (ref 8.6–12.4)
Monocytes Absolute: 0.5 10*3/uL (ref 0.1–1.0)
NEUTROS ABS: 2.6 10*3/uL (ref 1.7–7.7)
NEUTROS PCT: 49 % (ref 43–77)
Platelets: 254 10*3/uL (ref 150–400)
RBC: 4.49 MIL/uL (ref 4.22–5.81)
RDW: 12.5 % (ref 11.5–15.5)
WBC: 5.3 10*3/uL (ref 4.0–10.5)

## 2015-09-29 LAB — TSH: TSH: 0.087 u[IU]/mL — AB (ref 0.350–4.500)

## 2015-09-29 MED ORDER — TAMSULOSIN HCL 0.4 MG PO CAPS: 0.4000 mg | ORAL_CAPSULE | Freq: Every day | ORAL | Status: DC

## 2015-09-29 MED ORDER — HYDROCODONE-ACETAMINOPHEN 5-325 MG PO TABS: 2.0000 | ORAL_TABLET | ORAL | Status: DC | PRN

## 2015-09-29 NOTE — Patient Instructions (Signed)
Please take flomax daily until you are seen by urology.  Please take norco only on an as needed basis.  Do not drive while taking this medication.  It is illegal and will be similar to driving while intoxicated.  Please take 800 mg ibuprofen as needed for moderate pain.  Please call office if you don't hear from [place] on your referrals in 1 week.

## 2015-09-29 NOTE — Progress Notes (Signed)
Subjective:    Patient ID: Daniel Case, male    DOB: 10-17-1960, 55 y.o.   MRN: [id number]  Flank Pain Associated symptoms include abdominal pain and dysuria. Pertinent negatives include no fever, numbness or weakness.  Patient reports to the office for evaluation of right sided flank pain x 3 days.  He reports that the pain in his flank is increasing in frequency.  Pain is intermittent and radiates from the back to the front.  The pain sometimes just feels like a tightness.  Pain is not aggravated by any factors.  He reports heat pad does tend to help.  He did have a dark brown urine last Thursday.  He reports that it didn't look like blood to him.  He does have a significant history of kidney stones.  He used to follow with a urologist in [place].  He reports that this doesn't feel consistent with his usual kidney stone.  He doesn't have a urologist here in [place].  He does have a history remotely of a left sided renal cyst.  Patient reports that he has been losing some weight.  He reports that he has been trying to lose some weight since his physical a year ago.  He reports that he is now between 202-204 on his home scale. No palpitations or excessive.  No change in appetite.  He is taking 200 mcg daily of levothyroxine.      Review of Systems  Constitutional: Positive for unexpected weight change. Negative for fever, chills, diaphoresis and fatigue.  Gastrointestinal: Positive for abdominal pain. Negative for nausea, vomiting, diarrhea, constipation, blood in stool and anal bleeding.  Endocrine: Positive for polydipsia.  Genitourinary: Positive for dysuria, urgency, frequency, hematuria, flank pain and testicular pain. Negative for scrotal swelling.  Neurological: Negative for dizziness, weakness and numbness.  Psychiatric/Behavioral: The patient is not nervous/anxious.        Objective:   Physical Exam  Constitutional: He is oriented to person, place, and time. He appears  well-developed and well-nourished. No distress.  HENT:  Head: Normocephalic.  Mouth/Throat: Oropharynx is clear and moist. No oropharyngeal exudate.  Eyes: Conjunctivae are normal. No scleral icterus.  Neck: Normal range of motion. Neck supple. No JVD present. No thyromegaly present.  Cardiovascular: Normal rate, regular rhythm, normal heart sounds and intact distal pulses.  Exam reveals no gallop and no friction rub.   No murmur heard. Pulmonary/Chest: Effort normal and breath sounds normal. No respiratory distress. He has no wheezes. He has no rales. He exhibits no tenderness.  Abdominal: Soft. Bowel sounds are normal. He exhibits no distension and no mass. There is no hepatosplenomegaly. There is no tenderness. There is CVA tenderness. There is no rebound and no guarding.  Musculoskeletal: Normal range of motion.  Lymphadenopathy:    He has no cervical adenopathy.  Neurological: He is alert and oriented to person, place, and time.  Skin: Skin is warm and dry. He is not diaphoretic.  Psychiatric: He has a normal mood and affect. His behavior is normal. Judgment and thought content normal.  Nursing note and vitals reviewed.   Filed Vitals:   09/29/15 1401  BP: 132/84  Pulse: 78  Temp: 98 F (36.7 C)  Resp: 18         Assessment & Plan:    1. Right flank pain -flomax -hydrocodone prn for pain -ibuprofen 800 mg prn for pain - Urinalysis, Routine w reflex microscopic (not at [facility]) - Culture, Urine - BASIC METABOLIC PANEL WITH GFR -  Ambulatory referral to Urology - CBC with Differential/Platelet  2. Hypothyroidism, unspecified hypothyroidism type -likely source of weight loss - TSH  3. Screening for colon cancer -overdue - Ambulatory referral to Gastroenterology  4. NEPHROLITHIASIS, HX OF -confirmed on renal US in 2011

## 2015-09-30 ENCOUNTER — Other Ambulatory Visit: Payer: Self-pay | Admitting: Internal Medicine

## 2015-09-30 LAB — URINE CULTURE
COLONY COUNT: NO GROWTH
Organism ID, Bacteria: NO GROWTH

## 2015-09-30 MED ORDER — LEVOTHYROXINE SODIUM 150 MCG PO TABS: 150.0000 ug | ORAL_TABLET | Freq: Every day | ORAL | Status: DC

## 2015-11-12 ENCOUNTER — Encounter: Payer: Self-pay | Admitting: Internal Medicine

## 2015-11-12 ENCOUNTER — Other Ambulatory Visit: Payer: Self-pay | Admitting: Internal Medicine

## 2015-11-12 ENCOUNTER — Ambulatory Visit (INDEPENDENT_AMBULATORY_CARE_PROVIDER_SITE_OTHER): Payer: 59 | Admitting: Internal Medicine

## 2015-11-12 VITALS — BP 126/84 | HR 72 | Temp 97.6°F | Resp 16 | Ht 77.5 in | Wt 202.4 lb

## 2015-11-12 DIAGNOSIS — E039 Hypothyroidism, unspecified: Secondary | ICD-10-CM

## 2015-11-12 DIAGNOSIS — Z1212 Encounter for screening for malignant neoplasm of rectum: Secondary | ICD-10-CM

## 2015-11-12 DIAGNOSIS — R030 Elevated blood-pressure reading, without diagnosis of hypertension: Secondary | ICD-10-CM

## 2015-11-12 DIAGNOSIS — Z79899 Other long term (current) drug therapy: Secondary | ICD-10-CM

## 2015-11-12 DIAGNOSIS — Z9989 Dependence on other enabling machines and devices: Secondary | ICD-10-CM

## 2015-11-12 DIAGNOSIS — Z0000 Encounter for general adult medical examination without abnormal findings: Secondary | ICD-10-CM | POA: Diagnosis not present

## 2015-11-12 DIAGNOSIS — E559 Vitamin D deficiency, unspecified: Secondary | ICD-10-CM

## 2015-11-12 DIAGNOSIS — E109 Type 1 diabetes mellitus without complications: Secondary | ICD-10-CM

## 2015-11-12 DIAGNOSIS — E785 Hyperlipidemia, unspecified: Secondary | ICD-10-CM

## 2015-11-12 DIAGNOSIS — R5383 Other fatigue: Secondary | ICD-10-CM

## 2015-11-12 DIAGNOSIS — Z111 Encounter for screening for respiratory tuberculosis: Secondary | ICD-10-CM | POA: Diagnosis not present

## 2015-11-12 DIAGNOSIS — Z0001 Encounter for general adult medical examination with abnormal findings: Secondary | ICD-10-CM

## 2015-11-12 DIAGNOSIS — G4733 Obstructive sleep apnea (adult) (pediatric): Secondary | ICD-10-CM

## 2015-11-12 DIAGNOSIS — Z125 Encounter for screening for malignant neoplasm of prostate: Secondary | ICD-10-CM

## 2015-11-12 DIAGNOSIS — E349 Endocrine disorder, unspecified: Secondary | ICD-10-CM

## 2015-11-12 LAB — CBC WITH DIFFERENTIAL/PLATELET
BASOS ABS: 0.1 10*3/uL (ref 0.0–0.1)
Basophils Relative: 1 % (ref 0–1)
EOS ABS: 0.1 10*3/uL (ref 0.0–0.7)
EOS PCT: 1 % (ref 0–5)
HCT: 45.4 % (ref 39.0–52.0)
Hemoglobin: 15.5 g/dL (ref 13.0–17.0)
LYMPHS PCT: 27 % (ref 12–46)
Lymphs Abs: 2 10*3/uL (ref 0.7–4.0)
MCH: 32.8 pg (ref 26.0–34.0)
MCHC: 34.1 g/dL (ref 30.0–36.0)
MCV: 96 fL (ref 78.0–100.0)
MPV: 9.7 fL (ref 8.6–12.4)
Monocytes Absolute: 0.7 10*3/uL (ref 0.1–1.0)
Monocytes Relative: 9 % (ref 3–12)
NEUTROS PCT: 62 % (ref 43–77)
Neutro Abs: 4.5 10*3/uL (ref 1.7–7.7)
Platelets: 272 10*3/uL (ref 150–400)
RBC: 4.73 MIL/uL (ref 4.22–5.81)
RDW: 13 % (ref 11.5–15.5)
WBC: 7.3 10*3/uL (ref 4.0–10.5)

## 2015-11-12 LAB — BASIC METABOLIC PANEL WITH GFR
BUN: 23 mg/dL (ref 7–25)
CALCIUM: 9.5 mg/dL (ref 8.6–10.3)
CHLORIDE: 97 mmol/L — AB (ref 98–110)
CO2: 24 mmol/L (ref 20–31)
CREATININE: 1.35 mg/dL — AB (ref 0.70–1.33)
GFR, Est African American: 68 mL/min (ref >=60)
GFR, Est Non African American: 59 mL/min — ABNORMAL LOW (ref >=60)
GLUCOSE: 497 mg/dL — AB (ref 65–99)
Potassium: 4.6 mmol/L (ref 3.5–5.3)
Sodium: 135 mmol/L (ref 135–146)

## 2015-11-12 LAB — IRON AND TIBC
%SAT: 24 % (ref 15–60)
Iron: 63 ug/dL (ref 50–180)
TIBC: 260 ug/dL (ref 250–425)
UIBC: 197 ug/dL (ref 125–400)

## 2015-11-12 LAB — LIPID PANEL
CHOLESTEROL: 147 mg/dL (ref 125–200)
HDL: 37 mg/dL — ABNORMAL LOW (ref >=40)
LDL Cholesterol: 60 mg/dL (ref <130)
Total CHOL/HDL Ratio: 4 Ratio (ref <=5.0)
Triglycerides: 250 mg/dL — ABNORMAL HIGH (ref <150)
VLDL: 50 mg/dL — AB (ref <30)

## 2015-11-12 LAB — MAGNESIUM: MAGNESIUM: 2 mg/dL (ref 1.5–2.5)

## 2015-11-12 LAB — HEPATIC FUNCTION PANEL
ALT: 29 U/L (ref 9–46)
AST: 17 U/L (ref 10–35)
Albumin: 4.2 g/dL (ref 3.6–5.1)
Alkaline Phosphatase: 106 U/L (ref 40–115)
Bilirubin, Direct: 0.2 mg/dL (ref <=0.2)
Indirect Bilirubin: 0.6 mg/dL (ref 0.2–1.2)
TOTAL PROTEIN: 7.1 g/dL (ref 6.1–8.1)
Total Bilirubin: 0.8 mg/dL (ref 0.2–1.2)

## 2015-11-12 LAB — VITAMIN B12: VITAMIN B 12: 410 pg/mL (ref 200–1100)

## 2015-11-12 LAB — TSH: TSH: 7.58 m[IU]/L — AB (ref 0.40–4.50)

## 2015-11-12 NOTE — Progress Notes (Signed)
Patient ID: Daniel Case, male   DOB: 06-Oct-1960, 55 y.o.   MRN: [id number]  Annual  Screening/Preventative Visit And Comprehensive Evaluation & Examination  This very nice 55 y.o. MWM presents for a Wellness/Preventative Visit & comprehensive evaluation and management of multiple medical co-morbidities.  Patient has been followed for HTN, Prediabetes, Hyperlipidemia and Vitamin D Deficiency.   Labile HTN predates since 2013 and patient has been followed expectantly. Patient's reports BP's have been ranging 128-140/78-88 at work. .Today's BP: 126/84 mmHg. Patient denies any cardiac symptoms as chest pain, palpitations, shortness of breath, dizziness or ankle swelling.   Patient's hyperlipidemia is controlled with diet and medications. Patient denies myalgias or other medication SE's. Last lipids were 05/22/2015: Cholesterol 122*; HDL 34*; LDL Cholesterol 53; Triglycerides 177*   Patient has hx prediabetes  With A1c 5.7% in 2011 and 5.6% in 2013 but last A1c have been climbing to 6.6% and then more recently 7.6% in Sept 2016. Patient atient denies reactive hypoglycemic symptoms, visual blurring, but does report polyuria q45-60 minutes day & night and constant thirst despite drinking up to 6 liters of water daily. He also is reporting painful nocturnal leg & feet cramps. He denies paresthesias.  Patient has lost 29# since Sept 2016 & current BMI is 23+.    Patient also has hx/o Low T and is on Testosterone replacement. Finally, patient has history of Vitamin D Deficiency of "35" in 2011 and last vitamin D was  35 on 05/22/2015.  Medication Sig  . atorvastatin 80 MG tablet Take 1 tablet (80 mg total) by mouth at bedtime.  Marland Kitchen VITAMIN D Take 4,000 Int'l Units by mouth daily.  Marland Kitchen levothyroxine  150 MCG tablet Take 1 tablet (150 mcg total) by mouth daily.  . Omega-3FISH OIL Take by mouth daily.  Marland Kitchen testosterone (TESTIM) 50 MG/5GM (1%) GEL Place 5 g onto the skin daily.   Allergies  Allergen Reactions  .  Shellfish Allergy Rash   Past Medical History  Diagnosis Date  . Anxiety   . Other testicular hypofunction   . Hyperlipidemia    Health Maintenance  Topic Date Due  . COLONOSCOPY  05/19/2010  . INFLUENZA VACCINE  03/31/2015  . TETANUS/TDAP  10/30/2023  . Hepatitis C Screening  Completed  . HIV Screening  Completed   Immunization History  Administered Date(s) Administered  . Influenza Split 06/12/2013  . Influenza Whole 06/30/2009  . PPD Test 10/29/2013, 10/30/2014  . Td 01/08/2004  . Tdap 10/29/2013   Past Surgical History  Procedure Laterality Date  . Thyroidectomy  2001   Family History  Problem Relation Age of Onset  . Diabetes Father   . Hypertension Father   . Diabetes Sister     Social History   Social History  . Marital Status: Married    Spouse Name: N/A  . Number of Children: N/A  . Years of Education: N/A   Occupational History  . Not on file.   Social History Main Topics  . Smoking status: Former Smoker    Types: Cigars    Quit date: 05/21/2013  . Smokeless tobacco: Not on file  . Alcohol Use: 8.4 oz/week    14 Standard drinks or equivalent per week     Comment: occ  . Drug Use: No  . Sexual Activity: Not on file   Other Topics Concern  . Not on file   Social History Narrative    ROS Constitutional: Denies fever, chills, weight loss/gain, headaches, insomnia,  night sweats or  change in appetite. Does c/o fatigue. Eyes: Denies redness, blurred vision, diplopia, discharge, itchy or watery eyes.  ENT: Denies discharge, congestion, post nasal drip, epistaxis, sore throat, earache, hearing loss, dental pain, Tinnitus, Vertigo, Sinus pain or snoring.  Cardio: Denies chest pain, palpitations, irregular heartbeat, syncope, dyspnea, diaphoresis, orthopnea, PND, claudication or edema Respiratory: denies cough, dyspnea, DOE, pleurisy, hoarseness, laryngitis or wheezing.  Gastrointestinal: Denies dysphagia, heartburn, reflux, water brash, pain,  cramps, nausea, vomiting, bloating, diarrhea, constipation, hematemesis, melena, hematochezia, jaundice or hemorrhoids Genitourinary: Denies dysuria, frequency, urgency, nocturia, hesitancy, discharge, hematuria or flank pain Musculoskeletal: Denies arthralgia, myalgia, stiffness, Jt. Swelling, pain, limp or strain/sprain. Denies Falls. Skin: Denies puritis, rash, hives, warts, acne, eczema or change in skin lesion Neuro: No weakness, tremor, incoordination, spasms, paresthesia or pain Psychiatric: Denies confusion, memory loss or sensory loss. Denies Depression. Endocrine: Denies change in weight, skin, hair change, nocturia, and paresthesia, diabetic polys, visual blurring or hyper / hypo glycemic episodes.  Heme/Lymph: No excessive bleeding, bruising or enlarged lymph nodes.  Physical Exam  BP 126/84 mmHg  Pulse 72  Temp(Src) 97.6 F (36.4 C)  Resp 16  Ht 6' 5.5" (1.969 m)  Wt 202 lb 6.4 oz (91.808 kg)  BMI 23.68 kg/m2  General Appearance: Well nourished, in no apparent distress. Eyes: PERRLA, EOMs, conjunctiva no swelling or erythema, normal fundi and vessels. Sinuses: No frontal/maxillary tenderness ENT/Mouth: EACs patent / TMs  nl. Nares clear without erythema, swelling, mucoid exudates. Oral hygiene is good. No erythema, swelling, or exudate. Tongue normal, non-obstructing. Tonsils not swollen or erythematous. Hearing normal.  Neck: Supple, thyroid normal. No bruits, nodes or JVD. Respiratory: Respiratory effort normal.  BS equal and clear bilateral without rales, rhonci, wheezing or stridor. Cardio: Heart sounds are normal with regular rate and rhythm and no murmurs, rubs or gallops. Peripheral pulses are normal and equal bilaterally without edema. No aortic or femoral bruits. Chest: symmetric with normal excursions and percussion.  Abdomen: Soft, with Nl bowel sounds. Nontender, no guarding, rebound, hernias, masses, or organomegaly.  Lymphatics: Non tender without  lymphadenopathy.  Genitourinary: No hernias.Testes nl. DRE - prostate nl for age - smooth & firm w/o nodules. Musculoskeletal: Full ROM all peripheral extremities, joint stability, 5/5 strength, and normal gait. Skin: Warm and dry without rashes, lesions, cyanosis, clubbing or  ecchymosis.  Neuro: Cranial nerves intact, reflexes equal bilaterally. Normal muscle tone, no cerebellar symptoms. Sensation intact.  Pysch: Alert and oriented X 3 with normal affect, insight and judgment appropriate.   Assessment and Plan  1. Annual Preventative/Screening Exam    2. Elevated blood pressure reading without diagnosis of hypertension  - Microalbumin / creatinine urine ratio - EKG 12-Lead - [place], RETROPERITNL ABD,  LTD - TSH  3. Hyperlipidemia  - Lipid panel - TSH  4. Prediabetes / suspect LADA (Latent Autoimmune Diabetes of Adults)   - Hemoglobin A1c - Insulin, random - GAD - 65  - C-Peptide  5. Vitamin D deficiency  - VITAMIN D 25 Hydroxy   6. Testosterone deficiency  - Testosterone  7. Hypothyroidism  - TSH  8. OSA on CPAP   9. Screening for rectal cancer  - POC Hemoccult Bld/Stl  10. Prostate cancer screening  - PSA  11. Other fatigue  - Vitamin B12 - Iron and TIBC - Testosterone - CBC with Differential/Platelet - TSH  12. Medication management  - Urinalysis, Routine w reflex microscopic  - CBC with Differential/Platelet - BASIC METABOLIC PANEL WITH GFR - Hepatic function panel - Magnesium  Continue prudent diet as discussed, weight control, BP monitoring, regular exercise, and medications as discussed.  Discussed med effects and SE's. Routine screening labs and tests as requested with regular follow-up as recommended. Over 40 minutes of exam, counseling, chart review and high complex critical decision making was performed

## 2015-11-12 NOTE — Patient Instructions (Signed)

## 2015-11-13 ENCOUNTER — Other Ambulatory Visit: Payer: Self-pay | Admitting: Internal Medicine

## 2015-11-13 ENCOUNTER — Other Ambulatory Visit: Payer: Self-pay | Admitting: *Deleted

## 2015-11-13 DIAGNOSIS — E119 Type 2 diabetes mellitus without complications: Secondary | ICD-10-CM

## 2015-11-13 LAB — URINALYSIS, ROUTINE W REFLEX MICROSCOPIC
BILIRUBIN URINE: NEGATIVE
HGB URINE DIPSTICK: NEGATIVE
Leukocytes, UA: NEGATIVE
Nitrite: NEGATIVE
Protein, ur: NEGATIVE
SPECIFIC GRAVITY, URINE: 1.04 — AB (ref 1.001–1.035)
pH: 5.5 (ref 5.0–8.0)

## 2015-11-13 LAB — URINALYSIS, MICROSCOPIC ONLY
BACTERIA UA: NONE SEEN [HPF]
Casts: NONE SEEN [LPF]
Crystals: NONE SEEN [HPF]
Squamous Epithelial / LPF: NONE SEEN [HPF] (ref <=5)
Yeast: NONE SEEN [HPF]

## 2015-11-13 LAB — C-PEPTIDE: C PEPTIDE: 0.8 ng/mL (ref 0.80–3.85)

## 2015-11-13 LAB — INSULIN, RANDOM: Insulin: 1.9 u[IU]/mL — ABNORMAL LOW (ref 2.0–19.6)

## 2015-11-13 LAB — MICROALBUMIN / CREATININE URINE RATIO
CREATININE, URINE: 55 mg/dL (ref 20–370)
Microalb Creat Ratio: 16 mcg/mg creat (ref <30)
Microalb, Ur: 0.9 mg/dL

## 2015-11-13 LAB — TESTOSTERONE: Testosterone: 440 ng/dL (ref 250–827)

## 2015-11-13 LAB — HEMOGLOBIN A1C
HEMOGLOBIN A1C: 14.4 % — AB (ref <5.7)
MEAN PLASMA GLUCOSE: 367 mg/dL — AB (ref <117)

## 2015-11-13 LAB — PSA: PSA: 0.21 ng/mL (ref <=4.00)

## 2015-11-13 LAB — VITAMIN D 25 HYDROXY (VIT D DEFICIENCY, FRACTURES): Vit D, 25-Hydroxy: 32 ng/mL (ref 30–100)

## 2015-11-13 MED ORDER — METFORMIN HCL ER 500 MG PO TB24: ORAL_TABLET | ORAL | Status: DC

## 2015-11-13 MED ORDER — LEVOTHYROXINE SODIUM 175 MCG PO TABS: 175.0000 ug | ORAL_TABLET | Freq: Every day | ORAL | Status: DC

## 2015-11-17 ENCOUNTER — Other Ambulatory Visit: Payer: Self-pay | Admitting: *Deleted

## 2015-11-17 ENCOUNTER — Other Ambulatory Visit: Payer: Self-pay | Admitting: Internal Medicine

## 2015-11-17 LAB — GLUTAMIC ACID DECARBOXYLASE AUTO ABS: Glutamic Acid Decarb Ab: >250 IU/mL — ABNORMAL HIGH (ref <5)

## 2015-11-17 MED ORDER — FREESTYLE SYSTEM KIT: PACK | Status: DC

## 2015-11-17 MED ORDER — FREESTYLE LANCETS MISC: Status: DC

## 2015-11-17 MED ORDER — GLUCOSE BLOOD VI STRP: ORAL_STRIP | Status: DC

## 2015-11-18 LAB — TB SKIN TEST
INDURATION: 0 mm
TB SKIN TEST: NEGATIVE

## 2015-11-19 ENCOUNTER — Ambulatory Visit (INDEPENDENT_AMBULATORY_CARE_PROVIDER_SITE_OTHER): Payer: 59 | Admitting: Internal Medicine

## 2015-11-19 ENCOUNTER — Encounter: Payer: Self-pay | Admitting: Internal Medicine

## 2015-11-19 VITALS — BP 130/90 | HR 82 | Temp 97.3°F | Resp 16 | Ht 77.5 in | Wt 207.6 lb

## 2015-11-19 DIAGNOSIS — E1065 Type 1 diabetes mellitus with hyperglycemia: Secondary | ICD-10-CM

## 2015-11-19 LAB — CBC WITH DIFFERENTIAL/PLATELET
Basophils Absolute: 0.1 10*3/uL (ref 0.0–0.1)
Basophils Relative: 1 % (ref 0–1)
EOS ABS: 0.1 10*3/uL (ref 0.0–0.7)
EOS PCT: 2 % (ref 0–5)
HEMATOCRIT: 43.7 % (ref 39.0–52.0)
Hemoglobin: 14.7 g/dL (ref 13.0–17.0)
LYMPHS ABS: 2 10*3/uL (ref 0.7–4.0)
LYMPHS PCT: 28 % (ref 12–46)
MCH: 31.5 pg (ref 26.0–34.0)
MCHC: 33.6 g/dL (ref 30.0–36.0)
MCV: 93.6 fL (ref 78.0–100.0)
MONO ABS: 0.8 10*3/uL (ref 0.1–1.0)
MPV: 10.2 fL (ref 8.6–12.4)
Monocytes Relative: 11 % (ref 3–12)
Neutro Abs: 4.1 10*3/uL (ref 1.7–7.7)
Neutrophils Relative %: 58 % (ref 43–77)
PLATELETS: 249 10*3/uL (ref 150–400)
RBC: 4.67 MIL/uL (ref 4.22–5.81)
RDW: 13.1 % (ref 11.5–15.5)
WBC: 7 10*3/uL (ref 4.0–10.5)

## 2015-11-19 LAB — COMPREHENSIVE METABOLIC PANEL
ALBUMIN: 4.3 g/dL (ref 3.6–5.1)
ALK PHOS: 99 U/L (ref 40–115)
ALT: 25 U/L (ref 9–46)
AST: 18 U/L (ref 10–35)
BUN: 19 mg/dL (ref 7–25)
CALCIUM: 9.6 mg/dL (ref 8.6–10.3)
CHLORIDE: 97 mmol/L — AB (ref 98–110)
CO2: 25 mmol/L (ref 20–31)
CREATININE: 0.91 mg/dL (ref 0.70–1.33)
Glucose, Bld: 367 mg/dL — ABNORMAL HIGH (ref 65–99)
POTASSIUM: 4.4 mmol/L (ref 3.5–5.3)
Sodium: 132 mmol/L — ABNORMAL LOW (ref 135–146)
TOTAL PROTEIN: 6.8 g/dL (ref 6.1–8.1)
Total Bilirubin: 0.6 mg/dL (ref 0.2–1.2)

## 2015-11-19 MED ORDER — INSULIN LISPRO PROT & LISPRO (75-25 MIX) 100 UNIT/ML KWIKPEN: PEN_INJECTOR | SUBCUTANEOUS | Status: DC

## 2015-11-19 NOTE — Progress Notes (Signed)
   Subjective:    Patient ID: Daniel Case, male    DOB: 1960-10-14, 55 y.o.   MRN: [id number]  HPI  Patient presents to the office for diabetic teaching.  He reports that he has been doing okay with the metformin with the start of medication.  He generally eats in the monring and takes 1 tablet and then he also eats a small meal and then he will eat something quick and small at work and then has a larger meal before he goes to bed.  He does work 3rd shift and does alternate sleeping patterns.  He reports have a very physical job and also eating a very healthy diet with minimal starchy foods and also minimal sweets.  He has a sister who has juvenile onset diabetes at age 26.  He is aware of how to test blood sugars and has given insulin shots before.    Review of Systems  Constitutional: Positive for appetite change and fatigue. Negative for fever and chills.  Gastrointestinal: Negative for nausea and vomiting.  Endocrine: Positive for polydipsia, polyphagia and polyuria.  Genitourinary: Positive for urgency and frequency.  Neurological: Negative for dizziness and light-headedness.       Objective:   Physical Exam  Constitutional: He is oriented to person, place, and time. He appears well-developed and well-nourished. No distress.  HENT:  Head: Normocephalic.  Mouth/Throat: Oropharynx is clear and moist. No oropharyngeal exudate.  Eyes: Conjunctivae are normal. No scleral icterus.  Neck: Normal range of motion. Neck supple. No JVD present. No thyromegaly present.  Cardiovascular: Normal rate, regular rhythm, normal heart sounds and intact distal pulses.  Exam reveals no gallop and no friction rub.   No murmur heard. Pulmonary/Chest: Effort normal and breath sounds normal. No respiratory distress. He has no wheezes. He has no rales. He exhibits no tenderness.  Abdominal: Soft. Bowel sounds are normal. He exhibits no distension and no mass. There is no tenderness. There is no rebound and no  guarding.  Musculoskeletal: Normal range of motion.  Lymphadenopathy:    He has no cervical adenopathy.  Neurological: He is alert and oriented to person, place, and time.  Skin: Skin is warm and dry. He is not diaphoretic.  Psychiatric: He has a normal mood and affect. His behavior is normal. Judgment and thought content normal.  Nursing note and vitals reviewed.   Filed Vitals:   11/19/15 1113  BP: 130/90  Pulse: 82  Temp: 97.3 F (36.3 C)  Resp: 16          Assessment & Plan:    1. Type 1 diabetes mellitus with hyperglycemia (HCC) - CBC with Differential/Platelet - Hemoglobin A1c - Comprehensive metabolic panel  Taught patient how to check blood sugar here in the office and also sent him home with a glucometer.  Instructed on how to give insulin and which sites around the body were appropriate for subcutaneous injections and to hold needle in place for 10 seconds after dosing medication before removing needle.  Target BS ranges given.  Hypoglycemia precautions discussed and dextrose tablets were given.  Also discussed the possibility of using orange juice or regular soda.  Patient and his wife both stated understanding.  Patient to call office if BS less than 70.   Recheck in 1 week with Dr. Melford Aase.

## 2015-11-19 NOTE — Patient Instructions (Signed)
Please start eating 3 meals per day.  You may also want start having a snack in between meals as well.  Please start checking your blood sugars.  I would check a blood sugar first thing when you wake up prior to a meal, before your second meal of the day and 30 minutes after your largest meal of the day.  Please continue to take the metformin as you are currently taking the medication.    Please start injecting 15 units of the insulin when you wake up with or after your first meal of the day.  Please inject 10 units of insulin with your largest meal of the day before you go to bed.  Please monitor your blood sugar closely.   The goal when you wake up is to have blood sugars is to range from 100-125.    The goal for your blood sugar 2 hours after eating a meal is less than 220.    Please keep a log of your blood sugars including the time that you take it, the date, the level, and whether it is before or after a meal.    Call the office if you are getting levels below 70.    If blood sugars are low at home use either the dextrose tablets or you can take a sip of regular soda or orange juice to quickly bring sugars back up.  If you are having a lot of night sweats or you are having nightmares have peanut butter and crackers right before you go to bed.

## 2015-11-20 LAB — HEMOGLOBIN A1C
Hgb A1c MFr Bld: 14 % — ABNORMAL HIGH (ref <5.7)
Mean Plasma Glucose: 355 mg/dL — ABNORMAL HIGH (ref <117)

## 2015-11-21 ENCOUNTER — Other Ambulatory Visit: Payer: Self-pay | Admitting: Internal Medicine

## 2015-11-21 MED ORDER — CYCLOBENZAPRINE HCL 10 MG PO TABS: 10.0000 mg | ORAL_TABLET | Freq: Three times a day (TID) | ORAL | Status: DC | PRN

## 2015-11-24 ENCOUNTER — Other Ambulatory Visit: Payer: Self-pay | Admitting: *Deleted

## 2015-11-24 ENCOUNTER — Other Ambulatory Visit: Payer: Self-pay | Admitting: Internal Medicine

## 2015-11-24 MED ORDER — GLUCOSE BLOOD VI STRP: ORAL_STRIP | Status: DC

## 2015-11-25 ENCOUNTER — Ambulatory Visit (INDEPENDENT_AMBULATORY_CARE_PROVIDER_SITE_OTHER): Payer: 59 | Admitting: Internal Medicine

## 2015-11-25 ENCOUNTER — Encounter: Payer: Self-pay | Admitting: Internal Medicine

## 2015-11-25 VITALS — BP 112/80 | HR 64 | Temp 97.3°F | Resp 16 | Ht 77.5 in | Wt 210.6 lb

## 2015-11-25 DIAGNOSIS — E139 Other specified diabetes mellitus without complications: Secondary | ICD-10-CM | POA: Diagnosis not present

## 2015-11-25 NOTE — Progress Notes (Signed)
  Subjective:    Patient ID: Daniel Case, male    DOB: [birth year], 55 y.o.   MRN: [id number]  HPI  Patient is a b=nice 55 yo MWM who was recently dx'd with LADA ( Latent Autoimmune Diabetes of Adults- Type 1) with a very (+) Glutamic Acid Decarb Ab (GAD-65) of >250 and low insulin and C-peptide when he present with glucose  ~ 500 and a 30 # weight loss.  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 He reports CBGs have dramatically improved to the range of 108-128 . He reports some mild hypoglycemic sx's as his CBGs drop <100  and has appropriately rescued with peanut butter crackers aborting sx's. Also his thyroid dose was adjusted at his last visit.   Medication Sig  . atorvastatin (LIPITOR) 80 MG tablet Take 1 tablet (80 mg total) by mouth at bedtime.  . Cholecalciferol (VITAMIN D PO) Take 4,000 Int'l Units by mouth daily.  . cyclobenzaprine (FLEXERIL) 10 MG tablet Take 1 tablet (10 mg total) by mouth every 8 (eight) hours as needed for muscle spasms.  Marland Kitchen HUMALOG 75/25 MIX Kwikpen Inject 15 units & 10 units bid  . levothyroxine - 175 MCG tablet Take 1 tablet (175 mcg total) by mouth daily before breakfast.  . metFORMIN- XR)- 500 MG 24 hr tablet Take 1 tablet with breakfast & Lunch & 2 tablets with supper for Diabetes  . Omega-3 Fatty Acids (FISH OIL PO) Take by mouth daily.  Marland Kitchen testosterone (TESTIM)  (1%) GEL Place 5 g onto the skin daily.   Allergies  Allergen Reactions  . Shellfish Allergy Rash   Past Medical History  Diagnosis Date  . Anxiety   . Other testicular hypofunction   . Hyperlipidemia    Review of Systems 10 point systems review negative except as above.    Objective:   Physical Exam  BP 112/80 mmHg  Pulse 64  Temp(Src) 97.3 F (36.3 C)  Resp 16  Ht 6' 5.5" (1.969 m)  Wt 210 lb 9.6 oz (95.528 kg)  BMI 24.64 kg/m2  HEENT - Eac's patent. TM's Nl. EOM's full. PERRLA.  NasoOroPharynx clear. Neck - supple. Nl Thyroid. Carotids 2+ & No bruits, nodes, JVD Chest - Clear equal BS w/o Rales, rhonchi, wheezes. Cor - Nl HS. RRR w/o sig MGR. PP 1(+). No edema. Abd - No palpable organomegaly, masses or tenderness. BS nl. MS- FROM w/o deformities. Muscle power, tone and bulk Nl. Gait Nl. Neuro - No obvious Cr N abnormalities. Sensory, motor and Cerebellar functions appear Nl w/o focal abnormalities.    Assessment & Plan:   1. LADA (latent autoimmune diabetes in adults), managed as type 1 ([place])  - Over 20 minutes of exam, counseling, chart review and diabetic teaching was performed  - ROV 1 month & recheck A1c, BMET & TSH

## 2015-12-08 ENCOUNTER — Telehealth: Payer: Self-pay | Admitting: Internal Medicine

## 2015-12-08 NOTE — Telephone Encounter (Signed)
Patient called with elevated blood sugar readings.  He is currently having readings of 180-220 at 6 am.  He takes 10 units and then goes to bed.  At 4:30 pm he wakes up and has blood sugar readings from 108-120.  They are running around 300 after eating around 11 pm.  He takes 15 units before he goes to work in the evenings.  I will instruct him to increase to 20 units in the evenings prior to work.  Keep BS log and to call if Blood sugars do not improve.

## 2015-12-09 ENCOUNTER — Other Ambulatory Visit: Payer: Self-pay | Admitting: *Deleted

## 2015-12-09 MED ORDER — LEVOTHYROXINE SODIUM 175 MCG PO TABS
175.0000 ug | ORAL_TABLET | Freq: Every day | ORAL | Status: DC
Start: 2015-12-09 — End: 2017-01-13

## 2015-12-09 MED ORDER — INSULIN LISPRO PROT & LISPRO (75-25 MIX) 100 UNIT/ML KWIKPEN: PEN_INJECTOR | SUBCUTANEOUS | Status: DC

## 2015-12-09 MED ORDER — ATORVASTATIN CALCIUM 80 MG PO TABS: 80.0000 mg | ORAL_TABLET | Freq: Every day | ORAL | Status: DC

## 2015-12-09 MED ORDER — TESTOSTERONE 50 MG/5GM (1%) TD GEL: 5.0000 g | Freq: Every day | TRANSDERMAL | Status: DC

## 2015-12-22 ENCOUNTER — Other Ambulatory Visit: Payer: Self-pay | Admitting: Urology

## 2015-12-30 ENCOUNTER — Ambulatory Visit (INDEPENDENT_AMBULATORY_CARE_PROVIDER_SITE_OTHER): Payer: 59 | Admitting: Internal Medicine

## 2015-12-30 VITALS — BP 134/80 | HR 64 | Temp 97.5°F | Resp 16 | Ht 77.5 in | Wt 227.4 lb

## 2015-12-30 DIAGNOSIS — E139 Other specified diabetes mellitus without complications: Secondary | ICD-10-CM

## 2015-12-31 ENCOUNTER — Encounter: Payer: Self-pay | Admitting: Internal Medicine

## 2015-12-31 NOTE — Progress Notes (Signed)
  Subjective:    Patient ID: Daniel Case, male    DOB: [birth year], 55 y.o.   MRN: [id number]  HPI  Patient recentl dx'd with Insulin requiring LADA and started on Humalog Mix 75/25 bid at 20 - 20 and returns for 1 month recheck.  Insuling dosing is complicated by his work/shift of [employer] job on weekdays flipping to days to interact with family on weekends. Usually goes to bed for sleep at 7 am after a small bkfst and has had several episodes of nocturnal hypoglycemic reactions. Other complication is 25 # weight gain over the last 2 & 1/2 months since starting insulin on 13 Nov 2015. Denies diabetic polys, paresthesias or visual blurring. Asked about stopping Metformin.   Outpatient Prescriptions Prior to Visit  Medication Sig  . atorvastatin  80 MG  Take 1 tablet (80 mg total) by mouth at bedtime.  Marland Kitchen VITAMIN D Take 4,000 Int'l Units by mouth daily.  Marland Kitchen FREESTYLE INSULINX test strip Check blood sugar 3 times daily-[id number]  . HUMALOG 75/25 MIX Kwikpen Using 20 units bid.  Marland Kitchen levothyroxine  175 MCG  Take 1 tab daily before breakfast.  . metFORMIN-XR) 500 MG 24  Take 1 tablet with breakfast & Lunch & 2 tablets with supper for Diabetes  . TESTIM 50 MG/5GM (1%) GEL Place 5 g onto the skin daily.  . Omega-3 FISH OIL  Take by mouth daily. Reported on 12/30/2015  . cyclobenzaprine  10 MG tablet Take 1 tablet (10 mg total) by mouth every 8 (eight) hours as needed for muscle spasms.   Allergies  Allergen Reactions  . Shellfish Allergy Rash   Past Medical History  Diagnosis Date  . Anxiety   . Other testicular hypofunction   . Hyperlipidemia    Review of Systems  10 point systems review negative except as above.    Objective:   Physical Exam  BP 134/80 mmHg  Pulse 64  Temp(Src) 97.5 F (36.4 C)  Resp 16  Ht 6' 5.5" (1.969 m)  Wt 227 lb 6.4 oz (103.148 kg)  BMI 26.61 kg/m2  No formal exam with ~ 20 minutes spent discussing insulin dosing proportionate to diabetic diet.    Assessment &  Plan:   1. LADA (latent autoimmune diabetes in adults), managed as type 1 ([place])  - encouraged to continue Metformin explaining that it helps lower glucose & A1c via a non-Insulin mechanism.   - In view of desire to lose weight, he was advised to taper his 6-7 am smaller meal to 12 units and his evening pre-work evening  insulin to 16 units - effectively from total daily dosing of 40 to 28 units/day. Explained dynamics of adjusting insulin dosing.

## 2016-01-05 ENCOUNTER — Other Ambulatory Visit (HOSPITAL_COMMUNITY): Payer: Self-pay | Admitting: *Deleted

## 2016-01-05 ENCOUNTER — Other Ambulatory Visit: Payer: Self-pay | Admitting: *Deleted

## 2016-01-05 DIAGNOSIS — E119 Type 2 diabetes mellitus without complications: Secondary | ICD-10-CM

## 2016-01-05 MED ORDER — METFORMIN HCL ER 500 MG PO TB24: ORAL_TABLET | ORAL | Status: DC

## 2016-01-05 NOTE — Patient Instructions (Addendum)
Daniel Case  01/05/2016   Your procedure is scheduled on: 01-19-16  Report to [facility]  Entrance take [facility]  elevators to 3rd floor to  [place] at 530 AM.  Call this number if you have problems the morning of surgery [phone]   Remember: ONLY 1 PERSON MAY GO WITH YOU TO SHORT STAY TO GET  READY MORNING OF [place].  Do not eat food or drink liquids :After Midnight.Sunday                 TAKE 1/2 DOSE OF 75/25 HUMALOG DOSE PM ON 01-18-15  Take these medicines the morning of surgery with A SIP OF WATER: LEVOTHYROXINE (SYNTHROID),           DO NOT TAKE ANY DIABETIC MEDICATIONS DAY OF YOUR SURGERY                               You may not have any metal on your body including hair pins and              piercings  Do not wear jewelry, lotions, powders, deodorant             Do not shave  48 hours prior to surgery.              Men may shave face and neck.   Do not bring valuables to the hospital. [place].  Contacts, dentures or bridgework may not be worn into surgery.  Leave suitcase in the car. After surgery it may be brought to your room.     Patients discharged the day of surgery will not be allowed to drive home.  Name and phone number of your driver:  Special Instructions: N/A              Please read over the following fact sheets you were given: _____________________________________________________________________             [facility] - Preparing for Surgery Before surgery, you can play an important role.  Because skin is not sterile, your skin needs to be as free of germs as possible.  You can reduce the number of germs on your skin by washing with CHG (chlorahexidine gluconate) soap before surgery.  CHG is an antiseptic cleaner which kills germs and bonds with the skin to continue killing germs even after washing. Please DO NOT use if you have an allergy to CHG or  antibacterial soaps.  If your skin becomes reddened/irritated stop using the CHG and inform your nurse when you arrive at Short Stay. Do not shave (including legs and underarms) for at least 48 hours prior to the first CHG shower.  You may shave your face/neck. Please follow these instructions carefully:  1.  Shower with CHG Soap the night before surgery and the  morning of Surgery.  2.  If you choose to wash your hair, wash your hair first as usual with your  normal  shampoo.  3.  After you shampoo, rinse your hair and body thoroughly to remove the  shampoo.                           4.  Use CHG as you would any other liquid soap.  You can apply chg directly  to the skin and wash                       Gently with a scrungie or clean washcloth.  5.  Apply the CHG Soap to your body ONLY FROM THE NECK DOWN.   Do not use on face/ open                           Wound or open sores. Avoid contact with eyes, ears mouth and genitals (private parts).                       Wash face,  Genitals (private parts) with your normal soap.             6.  Wash thoroughly, paying special attention to the area where your surgery  will be performed.  7.  Thoroughly rinse your body with warm water from the neck down.  8.  DO NOT shower/wash with your normal soap after using and rinsing off  the CHG Soap.                9.  Pat yourself dry with a clean towel.            10.  Wear clean pajamas.            11.  Place clean sheets on your bed the night of your first shower and do not  sleep with pets. Day of Surgery : Do not apply any lotions/deodorants the morning of surgery.  Please wear clean clothes to the hospital/surgery center.  FAILURE TO FOLLOW THESE INSTRUCTIONS MAY RESULT IN THE CANCELLATION OF YOUR SURGERY PATIENT SIGNATURE_________________________________  NURSE SIGNATURE__________________________________  ________________________________________________________________________

## 2016-01-06 ENCOUNTER — Encounter (HOSPITAL_COMMUNITY)
Admission: RE | Admit: 2016-01-06 | Discharge: 2016-01-06 | Disposition: A | Discharge status: Home / Self Care | Payer: 59 | Source: Ambulatory Visit | Attending: Urology | Admitting: Urology

## 2016-01-06 ENCOUNTER — Encounter (HOSPITAL_COMMUNITY): Payer: Self-pay

## 2016-01-06 DIAGNOSIS — Z01812 Encounter for preprocedural laboratory examination: Secondary | ICD-10-CM | POA: Diagnosis not present

## 2016-01-06 HISTORY — DX: Adverse effect of unspecified anesthetic, initial encounter: T41.45XA

## 2016-01-06 HISTORY — DX: Other specified postprocedural states: Z98.890

## 2016-01-06 HISTORY — DX: Hypothyroidism, unspecified: E03.9

## 2016-01-06 HISTORY — DX: Other specified postprocedural states: R11.2

## 2016-01-06 HISTORY — DX: Sleep apnea, unspecified: G47.30

## 2016-01-06 HISTORY — DX: Calculus of kidney: N20.0

## 2016-01-06 HISTORY — DX: Cardiac murmur, unspecified: R01.1

## 2016-01-06 HISTORY — DX: Malignant (primary) neoplasm, unspecified: C80.1

## 2016-01-06 HISTORY — DX: Type 2 diabetes mellitus without complications: E11.9

## 2016-01-06 LAB — CBC
HCT: 42.5 % (ref 39.0–52.0)
Hemoglobin: 14.4 g/dL (ref 13.0–17.0)
MCH: 31.9 pg (ref 26.0–34.0)
MCHC: 33.9 g/dL (ref 30.0–36.0)
MCV: 94 fL (ref 78.0–100.0)
PLATELETS: 245 10*3/uL (ref 150–400)
RBC: 4.52 MIL/uL (ref 4.22–5.81)
RDW: 13.3 % (ref 11.5–15.5)
WBC: 5 10*3/uL (ref 4.0–10.5)

## 2016-01-06 LAB — BASIC METABOLIC PANEL
ANION GAP: 8 (ref 5–15)
BUN: 25 mg/dL — ABNORMAL HIGH (ref 6–20)
CALCIUM: 9.2 mg/dL (ref 8.9–10.3)
CO2: 25 mmol/L (ref 22–32)
Chloride: 108 mmol/L (ref 101–111)
Creatinine, Ser: 1.02 mg/dL (ref 0.61–1.24)
GFR calc Af Amer: >60 mL/min (ref >60)
GLUCOSE: 122 mg/dL — AB (ref 65–99)
Potassium: 4.5 mmol/L (ref 3.5–5.1)
Sodium: 141 mmol/L (ref 135–145)

## 2016-01-06 NOTE — Pre-Procedure Instructions (Deleted)
EKG in EPIC 

## 2016-01-06 NOTE — Pre-Procedure Instructions (Signed)
Preop BMP results routed to Dr. Gaynelle Arabian

## 2016-01-06 NOTE — Pre-Procedure Instructions (Signed)
EKG in EPIC 

## 2016-01-07 LAB — HEMOGLOBIN A1C
Hgb A1c MFr Bld: 10.4 % — ABNORMAL HIGH (ref 4.8–5.6)
Mean Plasma Glucose: 252 mg/dL

## 2016-01-08 NOTE — Pre-Procedure Instructions (Signed)
Darrel Reach, [facility], called back to say N.P. had signed off on chart in office and is aware of HgbA1C.

## 2016-01-08 NOTE — Pre-Procedure Instructions (Signed)
Left results of Hgb A1C on Daniel Case's VM at [facility].

## 2016-01-08 NOTE — Pre-Procedure Instructions (Signed)
Hgb A1C results routed to Dr. Gaynelle Arabian

## 2016-01-17 NOTE — H&P (Signed)
Reason For Visit Rt flank pain   Active Problems Problems  1. Gross hematuria (R31.0) 2. Hypogonadism, testicular (E29.1) 3. Nephrolithiasis (N20.0) 4. 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 He was seen on 10/01/15 with a 3 day hx of intermittent Right flank stabbing pain, radiating to the RLQ, associated with brown urine ( old gross hematuria). No nausea or vomiting. He was presumed to pass a kidney stone, because it followed the same pattern as previous kidney stone passages while living in [place] ( calcium oxalate stones).    Following this episode, he developed urinary frequency and excessive thirst, and glycosuria. Glucose 265, and A1c 7.6. He was placed on insulin.    Note PMH: papillary thyroid cancer, and hypogonadism, on TRT. However, he has been off Androgel for 3 weeks.   Diet: Nutitarian ( mostly vegetarian) x 8 yrs. Fast foods: rarely ( 1x/month). Sodas: none x 1 1/2 yrs. water ( 1/2 L + 5 glasses ), tea ( 2 glasses), coffee ( 6 cups)  tobacco: cigars with golf.   Past Medical History Problems  1. History of diabetes mellitus (Z86.39) 2. History of hypercholesterolemia (Z86.39) 3. History of malignant neoplasm of thyroid (Z85.850) 4. History of sleep apnea (Z86.69)  Surgical History Problems  1. History of Thyroid Surgery  Current Meds 1. Insulin;  Therapy: (Recorded:05Apr2017) to Recorded 2. Lipitor TABS;  Therapy: (Recorded:05Apr2017) to Recorded 3. MetFORMIN HCl TABS;  Therapy: (Recorded:05Apr2017) to Recorded 4. Synthroid TABS;  Therapy: (Recorded:05Apr2017) to Recorded  Allergies Non-Medication  1. Shellfish  Family History Problems  1. Family history of diabetes mellitus (Z83.3) : Father, Sister 2. Family history of kidney stones (Z84.1) 3. Family history of prostate  cancer (Z80.42)  Social History Problems    Alcohol use (Z78.9)   Caffeine use (F15.90)   Married   Never a smoker  Review of Systems Genitourinary, constitutional, skin, eye, otolaryngeal, hematologic/lymphatic, cardiovascular, pulmonary, endocrine, musculoskeletal, gastrointestinal, neurological and psychiatric system(s) were reviewed and pertinent findings if present are noted and are otherwise negative.  Genitourinary: urinary frequency.  Gastrointestinal: flank pain.    Vitals Vital Signs [Data Includes: Last 1 Day]  Recorded: 05Apr2017 08:52AM  Height: 6 ft 6 in Weight: 211 lb  BMI Calculated: 24.38 BSA Calculated: 2.31 Blood Pressure: 139 / 92 Temperature: 97.8 F Heart Rate: 62  Physical Exam Constitutional: Well nourished and well developed . No acute distress. The patient appears well hydrated.  ENT:. The ears and nose are normal in appearance.  Neck: The appearance of the neck is normal and no neck mass is present.  Pulmonary: No respiratory distress.  Cardiovascular:. No peripheral edema.  Abdomen: The abdomen is flat. The abdomen is soft and nontender. No masses are palpated. The abdomen is normal to percussion. No CVA tenderness. No hernias are palpable. No hepatosplenomegaly noted.  Rectal: Rectal exam demonstrates normal sphincter tone, the anus is normal on inspection. and no residual hemorrhoidal skin tags seen. Estimated prostate size is 3+. Normal rectal tone, no rectal masses, prostate is smooth, symmetric and non-tender. The prostate has no nodularity. The left seminal vesicle is nonpalpable. The right seminal vesicle is nonpalpable.  Genitourinary: Examination of the penis demonstrates no discharge, no masses, no lesions and a normal meatus. The penis is circumcised. The scrotum is normal in appearance and without lesions. The right vas deferens is is palpably normal.  The left vas deferens is palpably normal. The right epididymis is palpably normal and  non-tender. The left epididymis is palpably normal and non-tender. The right testis is palpably normal, non-tender and without masses. The left testis is normal, non-tender and without masses.  Lymphatics: The femoral and inguinal nodes are not enlarged or tender.  Skin: Normal skin turgor, no visible rash and no visible skin lesions.  Neuro/Psych:. Mood and affect are appropriate.    Results/Data Urine [Data Includes: Last 1 Day]   05Apr2017  COLOR YELLOW   APPEARANCE CLEAR   SPECIFIC GRAVITY 1.015   pH 6.0   GLUCOSE 2+   BILIRUBIN NEGATIVE   KETONE NEGATIVE   BLOOD NEGATIVE   PROTEIN NEGATIVE   NITRITE NEGATIVE   LEUKOCYTE ESTERASE TRACE   SQUAMOUS EPITHELIAL/HPF 0-5 HPF  WBC 0-5 WBC/HPF  RBC 0-2 RBC/HPF  BACTERIA NONE SEEN HPF  CRYSTALS NONE SEEN HPF  CASTS NONE SEEN LPF  Yeast NONE SEEN HPF   Assessment Assessed  1. Gross hematuria (R31.0) 2. Nephrolithiasis (N20.0) 3. 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 Hypogonadism, testicular (E29.1)  55 yo male calcium oxalate stone former, with recent hx of probable spontaneously passed stone, with continued gross hematuria and colic. He remembers that he has had a renal mass, thought to be a cyst on prior evaluation in [place]. His physical exam is wnl today, but he will need more complete hematuria work-up.     He is hypogonadal, and needs more complete exam. This may represent 2ndary hypogonadism from pituitary failure, rather than primary testis failure. He will have trough level T drawn today, and will need level drawn in the future to see if the Androgel is being absorbed. ( > 75 minutes evaluation).    1. Gross hematuria  2. Right renal colic  3. nephrolithiasis  4. hypogonadism  5. Hypothyroidism post thyroidectomy for papillary Ca.  6. IDDM   Plan Gross hematuria  1. AU CT-HEMATURIA PROTOCOL; Status:Hold For - Appointment,PreCert,Date of  Service,Print; Requested for:05Apr2017;  2. Cysto; Status:Hold For -  Appointment,Date of Service; Requested for:05Apr2017;  3. URINE CYTOLOGY W/REFLEX FISH; [Do Not Release]; Status:Hold For -  Specimen/Data Collection,Appointment; Requested for:05Apr2017;  Health Maintenance  4. UA With REFLEX; [Do Not Release]; Status:Resulted - Requires Verification;   DoneMF:[id number] 08:29AM Hypogonadism, testicular  5. Follow-up After Test Office  Follow-up  Status: Hold For - Appointment,Date of Service   Requested for: 05Apr2017 6. ESTRADIOL; Status:Hold For - Specimen/Data Collection,Appointment; Requested  for:05Apr2017;  7. [place] [facility]; Status:Hold For - Specimen/Data Collection,Appointment; Requested  for:05Apr2017;  8. PROLACTIN; Status:Hold For - Specimen/Data Collection,Appointment; Requested  for:05Apr2017;  9. TESTOSTERONE; Status:Hold For - Specimen/Data Collection,Appointment; Requested  for:05Apr2017;  Nephrolithiasis  10. HYPERCALCIURA PROFILE; Status:Hold For - Specimen/Data Collection,Appointment;   Requested for:05Apr2017;  11. [place]; Status:Hold For - Fish farm manager;   Requested for:05Apr2017;   1. Testosterone level ( will represent trough level), FSH, LH, prolactin, estradiol; calcium labs.  2. CT Hematuria protocol , RTC for cysto  3. [facility]   4. Urine for cytology.  Pt. Has returned April 18th having passed 1 stone, but KUB shiows retention of another 2nd ureteral stone. Right lower urerter.   Discussion/Summary cc: Starlyn Skeans, PA, Dr. Magnus Sinning     Signatures

## 2016-01-18 ENCOUNTER — Encounter: Payer: Self-pay | Admitting: *Deleted

## 2016-01-19 ENCOUNTER — Ambulatory Visit (HOSPITAL_COMMUNITY)
Admission: RE | Admit: 2016-01-19 | Discharge: 2016-01-19 | Disposition: A | Discharge status: Home / Self Care | Payer: 59 | Source: Ambulatory Visit | Attending: Urology | Admitting: Urology

## 2016-01-19 ENCOUNTER — Ambulatory Visit (HOSPITAL_COMMUNITY): Payer: 59 | Admitting: Anesthesiology

## 2016-01-19 ENCOUNTER — Encounter (HOSPITAL_COMMUNITY): Payer: Self-pay

## 2016-01-19 ENCOUNTER — Encounter (HOSPITAL_COMMUNITY)
Admission: RE | Disposition: A | Discharge status: Home / Self Care | Payer: Self-pay | Source: Ambulatory Visit | Attending: Urology

## 2016-01-19 ENCOUNTER — Ambulatory Visit (HOSPITAL_COMMUNITY): Payer: 59

## 2016-01-19 DIAGNOSIS — E119 Type 2 diabetes mellitus without complications: Secondary | ICD-10-CM | POA: Insufficient documentation

## 2016-01-19 DIAGNOSIS — G4730 Sleep apnea, unspecified: Secondary | ICD-10-CM | POA: Diagnosis not present

## 2016-01-19 DIAGNOSIS — E7800 Pure hypercholesterolemia, unspecified: Secondary | ICD-10-CM | POA: Diagnosis not present

## 2016-01-19 DIAGNOSIS — Z87891 Personal history of nicotine dependence: Secondary | ICD-10-CM | POA: Insufficient documentation

## 2016-01-19 DIAGNOSIS — N201 Calculus of ureter: Secondary | ICD-10-CM | POA: Diagnosis not present

## 2016-01-19 DIAGNOSIS — Z87442 Personal history of urinary calculi: Secondary | ICD-10-CM | POA: Insufficient documentation

## 2016-01-19 DIAGNOSIS — E291 Testicular hypofunction: Secondary | ICD-10-CM | POA: Insufficient documentation

## 2016-01-19 DIAGNOSIS — Z79899 Other long term (current) drug therapy: Secondary | ICD-10-CM | POA: Diagnosis not present

## 2016-01-19 DIAGNOSIS — E890 Postprocedural hypothyroidism: Secondary | ICD-10-CM | POA: Insufficient documentation

## 2016-01-19 DIAGNOSIS — Z794 Long term (current) use of insulin: Secondary | ICD-10-CM | POA: Insufficient documentation

## 2016-01-19 DIAGNOSIS — Z85850 Personal history of malignant neoplasm of thyroid: Secondary | ICD-10-CM | POA: Diagnosis not present

## 2016-01-19 HISTORY — PX: CYSTOSCOPY WITH HOLMIUM LASER LITHOTRIPSY: SHX6639

## 2016-01-19 HISTORY — PX: CYSTOSCOPY/RETROGRADE/URETEROSCOPY/STONE EXTRACTION WITH BASKET: SHX5317

## 2016-01-19 LAB — GLUCOSE, CAPILLARY
Glucose-Capillary: 90 mg/dL (ref 65–99)
Glucose-Capillary: 120 mg/dL — ABNORMAL HIGH (ref 65–99)

## 2016-01-19 SURGERY — CYSTOSCOPY, WITH CALCULUS REMOVAL USING BASKET
Anesthesia: General | Site: Renal | Laterality: Right

## 2016-01-19 MED ORDER — FENTANYL CITRATE (PF) 100 MCG/2ML IJ SOLN
INTRAMUSCULAR | Status: DC | PRN
  Administered 2016-01-19 (×2): 50 ug via INTRAVENOUS

## 2016-01-19 MED ORDER — CEFAZOLIN SODIUM-DEXTROSE 2-4 GM/100ML-% IV SOLN
2.0000 g | INTRAVENOUS | Status: AC
  Administered 2016-01-19: 2 g via INTRAVENOUS
  Filled 2016-01-19: qty 100

## 2016-01-19 MED ORDER — SODIUM CHLORIDE 0.9 % IR SOLN
Status: DC | PRN
  Administered 2016-01-19: 4000 mL

## 2016-01-19 MED ORDER — TRAMADOL-ACETAMINOPHEN 37.5-325 MG PO TABS: 1.0000 | ORAL_TABLET | Freq: Four times a day (QID) | ORAL | Status: DC | PRN

## 2016-01-19 MED ORDER — KETOROLAC TROMETHAMINE 30 MG/ML IJ SOLN
INTRAMUSCULAR | Status: DC | PRN
Start: 2016-01-19 — End: 2016-01-19
  Administered 2016-01-19: 30 mg via INTRAVENOUS

## 2016-01-19 MED ORDER — PROPOFOL 10 MG/ML IV BOLUS
INTRAVENOUS | Status: AC
  Filled 2016-01-19: qty 20

## 2016-01-19 MED ORDER — MEPERIDINE HCL 50 MG/ML IJ SOLN: 6.2500 mg | INTRAMUSCULAR | Status: DC | PRN

## 2016-01-19 MED ORDER — MIDAZOLAM HCL 5 MG/5ML IJ SOLN
INTRAMUSCULAR | Status: DC | PRN
  Administered 2016-01-19: 2 mg via INTRAVENOUS

## 2016-01-19 MED ORDER — HYOSCYAMINE SULFATE 0.125 MG SL SUBL: 0.1250 mg | SUBLINGUAL_TABLET | SUBLINGUAL | Status: DC | PRN

## 2016-01-19 MED ORDER — TAMSULOSIN HCL 0.4 MG PO CAPS: 0.4000 mg | ORAL_CAPSULE | Freq: Every day | ORAL | Status: DC

## 2016-01-19 MED ORDER — HYDROMORPHONE HCL 1 MG/ML IJ SOLN: 0.2500 mg | INTRAMUSCULAR | Status: DC | PRN

## 2016-01-19 MED ORDER — OXYCODONE HCL 5 MG PO TABS: 5.0000 mg | ORAL_TABLET | Freq: Once | ORAL | Status: DC | PRN

## 2016-01-19 MED ORDER — BELLADONNA ALKALOIDS-OPIUM 16.2-60 MG RE SUPP
RECTAL | Status: AC
  Filled 2016-01-19: qty 1

## 2016-01-19 MED ORDER — BELLADONNA ALKALOIDS-OPIUM 16.2-60 MG RE SUPP
RECTAL | Status: DC | PRN
  Administered 2016-01-19: 1 via RECTAL

## 2016-01-19 MED ORDER — ONDANSETRON HCL 4 MG/2ML IJ SOLN
INTRAMUSCULAR | Status: AC
  Filled 2016-01-19: qty 2

## 2016-01-19 MED ORDER — PROPOFOL 10 MG/ML IV BOLUS
INTRAVENOUS | Status: DC | PRN
  Administered 2016-01-19: 200 mg via INTRAVENOUS

## 2016-01-19 MED ORDER — DEXAMETHASONE SODIUM PHOSPHATE 10 MG/ML IJ SOLN
INTRAMUSCULAR | Status: DC | PRN
  Administered 2016-01-19: 10 mg via INTRAVENOUS

## 2016-01-19 MED ORDER — CEFAZOLIN SODIUM-DEXTROSE 2-4 GM/100ML-% IV SOLN
INTRAVENOUS | Status: AC
  Filled 2016-01-19: qty 100

## 2016-01-19 MED ORDER — LIDOCAINE HCL (CARDIAC) 20 MG/ML IV SOLN
INTRAVENOUS | Status: DC | PRN
  Administered 2016-01-19: 100 mg via INTRAVENOUS

## 2016-01-19 MED ORDER — LACTATED RINGERS IV SOLN
INTRAVENOUS | Status: DC
  Administered 2016-01-19: 10:00:00 via INTRAVENOUS

## 2016-01-19 MED ORDER — FENTANYL CITRATE (PF) 100 MCG/2ML IJ SOLN
INTRAMUSCULAR | Status: AC
  Filled 2016-01-19: qty 2

## 2016-01-19 MED ORDER — LACTATED RINGERS IV SOLN
INTRAVENOUS | Status: DC | PRN
  Administered 2016-01-19: 07:00:00 via INTRAVENOUS

## 2016-01-19 MED ORDER — PHENAZOPYRIDINE HCL 200 MG PO TABS: 200.0000 mg | ORAL_TABLET | Freq: Three times a day (TID) | ORAL | Status: DC | PRN

## 2016-01-19 MED ORDER — DEXAMETHASONE SODIUM PHOSPHATE 10 MG/ML IJ SOLN
INTRAMUSCULAR | Status: AC
  Filled 2016-01-19: qty 1

## 2016-01-19 MED ORDER — OXYCODONE HCL 5 MG/5ML PO SOLN
5.0000 mg | Freq: Once | ORAL | Status: DC | PRN
  Filled 2016-01-19: qty 5

## 2016-01-19 MED ORDER — KETOROLAC TROMETHAMINE 30 MG/ML IJ SOLN
INTRAMUSCULAR | Status: AC
  Filled 2016-01-19: qty 1

## 2016-01-19 MED ORDER — MIDAZOLAM HCL 2 MG/2ML IJ SOLN
INTRAMUSCULAR | Status: AC
  Filled 2016-01-19: qty 2

## 2016-01-19 MED ORDER — DIATRIZOATE MEGLUMINE 30 % UR SOLN
URETHRAL | Status: AC
  Filled 2016-01-19: qty 100

## 2016-01-19 MED ORDER — TRIMETHOPRIM 100 MG PO TABS: 100.0000 mg | ORAL_TABLET | ORAL | Status: DC

## 2016-01-19 MED ORDER — SCOPOLAMINE 1 MG/3DAYS TD PT72
MEDICATED_PATCH | TRANSDERMAL | Status: DC | PRN
  Administered 2016-01-19: 1 via TRANSDERMAL

## 2016-01-19 MED ORDER — DIATRIZOATE MEGLUMINE 30 % UR SOLN
URETHRAL | Status: DC | PRN
  Administered 2016-01-19: 5 mL via URETHRAL

## 2016-01-19 MED ORDER — LIDOCAINE HCL (CARDIAC) 20 MG/ML IV SOLN
INTRAVENOUS | Status: AC
  Filled 2016-01-19: qty 5

## 2016-01-19 MED ORDER — ONDANSETRON HCL 4 MG/2ML IJ SOLN
INTRAMUSCULAR | Status: DC | PRN
  Administered 2016-01-19: 4 mg via INTRAVENOUS

## 2016-01-19 MED ORDER — SCOPOLAMINE 1 MG/3DAYS TD PT72
MEDICATED_PATCH | TRANSDERMAL | Status: AC
  Filled 2016-01-19: qty 1

## 2016-01-19 SURGICAL SUPPLY — 27 items
BAG URO CATCHER STRL LF (MISCELLANEOUS) ×2 IMPLANT
BASKET STNLS GEMINI 4WIRE 3FR (BASKET) IMPLANT
BASKET ZERO TIP 1.9FR (BASKET) ×2 IMPLANT
BASKET ZERO TIP NITINOL 2.4FR (BASKET) IMPLANT
BENZOIN TINCTURE PRP APPL 2/3 (GAUZE/BANDAGES/DRESSINGS) IMPLANT
CATH INTERMIT  6FR 70CM (CATHETERS) ×2 IMPLANT
CATH URET DUAL LUMEN 6-10FR 50 (CATHETERS) ×2 IMPLANT
CLOTH BEACON ORANGE TIMEOUT ST (SAFETY) ×2 IMPLANT
DRAPE C-ARMOR (DRAPES) ×2 IMPLANT
DRSG TEGADERM 2-3/8X2-3/4 SM (GAUZE/BANDAGES/DRESSINGS) IMPLANT
FIBER LASER FLEXIVA 365 (UROLOGICAL SUPPLIES) ×2 IMPLANT
FIBER LASER TRAC TIP (UROLOGICAL SUPPLIES) IMPLANT
GLOVE BIOGEL M STRL SZ7.5 (GLOVE) ×2 IMPLANT
GLOVE BIOGEL PI IND STRL 6.5 (GLOVE) ×2 IMPLANT
GLOVE BIOGEL PI INDICATOR 6.5 (GLOVE) ×2
GLOVE SS BIOGEL STRL SZ 7.5 (GLOVE) ×1 IMPLANT
GLOVE SUPERSENSE BIOGEL SZ 7.5 (GLOVE) ×1
GOWN STRL REUS W/TWL XL LVL3 (GOWN DISPOSABLE) ×2 IMPLANT
GUIDEWIRE STR DUAL SENSOR (WIRE) ×2 IMPLANT
IV NS 1000ML (IV SOLUTION) ×1
IV NS 1000ML BAXH (IV SOLUTION) ×1 IMPLANT
IV NS IRRIG 3000ML ARTHROMATIC (IV SOLUTION) ×2 IMPLANT
MANIFOLD NEPTUNE II (INSTRUMENTS) ×2 IMPLANT
PACK CYSTO (CUSTOM PROCEDURE TRAY) ×2 IMPLANT
STENT URET 6FRX26 CONTOUR (STENTS) ×2 IMPLANT
SYRINGE IRR TOOMEY STRL 70CC (SYRINGE) ×2 IMPLANT
TUBING CONNECTING 10 (TUBING) ×2 IMPLANT

## 2016-01-19 NOTE — Discharge Instructions (Signed)
Dietary Guidelines to Help Prevent Kidney Stones Your risk of kidney stones can be decreased by adjusting the foods you eat. The most important thing you can do is drink enough fluid. You should drink enough fluid to keep your urine clear or pale yellow. The following guidelines provide specific information for the type of kidney stone you have had. GUIDELINES ACCORDING TO TYPE OF KIDNEY STONE Calcium Oxalate Kidney Stones  Reduce the amount of salt you eat. Foods that have a lot of salt cause your body to release excess calcium into your urine. The excess calcium can combine with a substance called oxalate to form kidney stones.  Reduce the amount of animal protein you eat if the amount you eat is excessive. Animal protein causes your body to release excess calcium into your urine. Ask your dietitian how much protein from animal sources you should be eating.  Avoid foods that are high in oxalates. If you take vitamins, they should have less than 500 mg of vitamin C. Your body turns vitamin C into oxalates. You do not need to avoid fruits and vegetables high in vitamin C. Calcium Phosphate Kidney Stones  Reduce the amount of salt you eat to help prevent the release of excess calcium into your urine.  Reduce the amount of animal protein you eat if the amount you eat is excessive. Animal protein causes your body to release excess calcium into your urine. Ask your dietitian how much protein from animal sources you should be eating.  Get enough calcium from food or take a calcium supplement (ask your dietitian for recommendations). Food sources of calcium that do not increase your risk of kidney stones include:  Broccoli.  Dairy products, such as cheese and yogurt.  Pudding. Uric Acid Kidney Stones  Do not have more than 6 oz of animal protein per day. FOOD SOURCES Animal Protein Sources  Meat (all types).  Poultry.  Eggs.  Fish, seafood. Foods High in [employer] seasonings.  Soy  sauce.  Teriyaki sauce.  Cured and processed meats.  Salted crackers and snack foods.  Fast food.  Canned soups and most canned foods. Foods High in Oxalates  Grains:  Amaranth.  Barley.  Grits.  Wheat germ.  Bran.  Buckwheat flour.  All bran cereals.  Pretzels.  Whole wheat bread.  Vegetables:  Beans (wax).  Beets and beet greens.  Collard greens.  Eggplant.  [place]ole.  Leeks.  Okra.  Parsley.  Rutabagas.  Spinach.  Swiss chard.  Tomato paste.  Fried potatoes.  Sweet potatoes.  Fruits:  Red currants.  Figs.  Kiwi.  Rhubarb.  Meat and Other Protein Sources:  Beans (dried).  Soy burgers and other soybean products.  Miso.  Nuts (peanuts, almonds, pecans, cashews, hazelnuts).  Nut butters.  Sesame seeds and tahini (paste made of sesame seeds).  Poppy seeds.  Beverages:  Chocolate drink mixes.  Soy milk.  Instant iced tea.  Juices made from high-oxalate fruits or vegetables.  Other:  Carob.  Chocolate.  Fruitcake.  Marmalades.   This information is not intended to replace advice given to you by your health care provider. Make sure you discuss any questions you have with your health care provider.     Ureteral Stent Implantation, Care After Refer to this sheet in the next few weeks. These instructions provide you with information on caring for yourself after your procedure. Your health care provider may also give you more specific instructions. Your treatment has been planned according to current  medical practices, but problems sometimes occur. Call your health care provider if you have any problems or questions after your procedure. WHAT TO EXPECT AFTER THE PROCEDURE You should be back to normal activity within 48 hours after the procedure. Nausea and vomiting may occur and are commonly the result of anesthesia. It is common to experience sharp pain in the back or lower abdomen and penis with voiding.  This is caused by movement of the ends of the stent with the act of urinating.It usually goes away within minutes after you have stopped urinating. HOME CARE INSTRUCTIONS Make sure to drink plenty of fluids. You may have small amounts of bleeding, causing your urine to be red. This is normal. Certain movements may trigger pain or a feeling that you need to urinate. You may be given medicines to prevent infection or bladder spasms. Be sure to take all medicines as directed. Only take over-the-counter or prescription medicines for pain, discomfort, or fever as directed by your health care provider. Do not take aspirin, as this can make bleeding worse. Your stent will be left in until the blockage is resolved. This may take 2 weeks or longer, depending on the reason for stent implantation. You may have an X-ray exam to make sure your ureter is open and that the stent has not moved out of position (migrated). The stent can be removed by your health care provider in the office. Medicines may be given for comfort while the stent is being removed. Be sure to keep all follow-up appointments so your health care provider can check that you are healing properly. SEEK MEDICAL CARE IF:  You experience increasing pain.  Your pain medicine is not working. SEEK IMMEDIATE MEDICAL CARE IF:  Your urine is dark red or has blood clots.  You are leaking urine (incontinent).  You have a fever, chills, feeling sick to your stomach (nausea), or vomiting.  Your pain is not relieved by pain medicine.  The end of the stent comes out of the urethra.  You are unable to urinate.   This information is not intended to replace advice given to you by your health care provider. Make sure you discuss any questions you have with your health care provider.   Document Released: 04/18/2013 Document Revised: 08/21/2013 Document Reviewed: 02/28/2015 Elsevier Interactive Patient Education 2016 [place] Anesthesia, Adult, Care After Refer to this sheet in the next few weeks. These instructions provide you with information on caring for yourself after your procedure. Your health care provider may also give you more specific instructions. Your treatment has been planned according to current medical practices, but problems sometimes occur. Call your health care provider if you have any problems or questions after your procedure. WHAT TO EXPECT AFTER THE PROCEDURE After the procedure, it is typical to experience:  Sleepiness.  Nausea and vomiting. HOME CARE INSTRUCTIONS  For the first 24 hours after general anesthesia:  Have a responsible person with you.  Do not drive a car. If you are alone, do not take public transportation.  Do not drink alcohol.  Do not take medicine that  has not been prescribed by your health care provider.  Do not sign important papers or make important decisions.  You may resume a normal diet and activities as directed by your health care provider.  If you have questions or problems that seem related to general anesthesia, call the hospital and ask for the anesthetist or anesthesiologist on call. SEEK MEDICAL CARE IF:  You have nausea and vomiting that continue the day after anesthesia.  You develop a rash. SEEK IMMEDIATE MEDICAL CARE IF:   You have difficulty breathing.  You have chest pain.  You have any allergic problems.   This information is not intended to replace advice given to you by your health care provider. Make sure you discuss any questions you have with your health care provider.   Document Released: 11/22/2000 Document Revised: 09/06/2014 Document Reviewed: 12/15/2011 Elsevier Interactive Patient Education [employer].

## 2016-01-19 NOTE — Anesthesia Preprocedure Evaluation (Signed)
Anesthesia Evaluation  Patient identified by MRN, date of birth, ID band Patient awake    Reviewed: Allergy & Precautions, NPO status , Patient's Chart, lab work & pertinent test results  History of Anesthesia Complications (+) PONV  Airway Mallampati: I  TM Distance: >3 FB Neck ROM: Full    Dental  (+) Teeth Intact, Dental Advisory Given   Pulmonary former smoker,    breath sounds clear to auscultation       Cardiovascular  Rhythm:Regular Rate:Normal     Neuro/Psych    GI/Hepatic   Endo/Other  diabetes, Well Controlled, Type 2, Insulin Dependent  Renal/GU      Musculoskeletal   Abdominal   Peds  Hematology   Anesthesia Other Findings   Reproductive/Obstetrics                             Anesthesia Physical Anesthesia Plan  ASA: II  Anesthesia Plan: General   Post-op Pain Management:    Induction: Intravenous  Airway Management Planned: LMA  Additional Equipment:   Intra-op Plan:   Post-operative Plan: Extubation in OR  Informed Consent: I have reviewed the patients History and Physical, chart, labs and discussed the procedure including the risks, benefits and alternatives for the proposed anesthesia with the patient or authorized representative who has indicated his/her understanding and acceptance.   Dental advisory given  Plan Discussed with: CRNA, Anesthesiologist and Surgeon  Anesthesia Plan Comments:         Anesthesia Quick Evaluation

## 2016-01-19 NOTE — Progress Notes (Signed)
Patient ambulated to bathroom with assistance.  Patient denied dizziness, nausea.

## 2016-01-19 NOTE — Anesthesia Postprocedure Evaluation (Signed)
Anesthesia Post Note  Patient: Daniel Case  Procedure(s) Performed: Procedure(s) (LRB): CYSTO/RIGHT RETROGRADE PYELOGRAM/URETEROSCOPY/STONE EXTRACTION WITH BASKET AND STENT PLACEMENT (Right) CYSTOSCOPY WITH HOLMIUM LASER LITHOTRIPSY (Right)  Patient location during evaluation: PACU Anesthesia Type: General Level of consciousness: awake and alert Pain management: pain level controlled Vital Signs Assessment: post-procedure vital signs reviewed and stable Respiratory status: spontaneous breathing, nonlabored ventilation and respiratory function stable Cardiovascular status: blood pressure returned to baseline and stable Postop Assessment: no signs of nausea or vomiting Anesthetic complications: no    Last Vitals:  Filed Vitals:   01/19/16 0948 01/19/16 1020  BP: 120/72 146/90  Pulse: 52 61  Temp: 36.6 C 36.5 C  Resp: 15 16    Last Pain:  Filed Vitals:   01/19/16 1040  PainSc: 0-No pain                 Zanaya Baize A

## 2016-01-19 NOTE — Progress Notes (Signed)
Patient  May go to Short Stay now -per Dr. Al Corpus

## 2016-01-19 NOTE — Interval H&P Note (Signed)
History and Physical Interval Note:  01/19/2016 7:24 AM  Iverson Alamin  has presented today for surgery, with the diagnosis of RIGHT URETERAL STONES  The various methods of treatment have been discussed with the patient and family. After consideration of risks, benefits and other options for treatment, the patient has consented to  Procedure(s): CYSTO/RIGHT RETROGRADE PYELOGRAM/URETEROSCOPY/STONE EXTRACTION WITH BASKET AND STENT PLACEMENT (Right) CYSTOSCOPY WITH HOLMIUM LASER LITHOTRIPSY (Right) as a surgical intervention .  The patient's history has been reviewed, patient examined, no change in status, stable for surgery.  I have reviewed the patient's chart and labs.  Questions were answered to the patient's satisfaction.     Osaze Hubbert I Mercer Peifer  Pt believes he has passed "part"of a stone, and understands that he will have retrograde pyelogrm and ureteroscopy and extraction of whatever stone i can find, with probable placement of a JJ stent-if necessary.

## 2016-01-19 NOTE — Transfer of Care (Signed)
Immediate Anesthesia Transfer of Care Note  Patient: Daniel Case  Procedure(s) Performed: Procedure(s): CYSTO/RIGHT RETROGRADE PYELOGRAM/URETEROSCOPY/STONE EXTRACTION WITH BASKET AND STENT PLACEMENT (Right) CYSTOSCOPY WITH HOLMIUM LASER LITHOTRIPSY (Right)  Patient Location: PACU  Anesthesia Type:General  Level of Consciousness: sedated  Airway & Oxygen Therapy: Patient Spontanous Breathing and Patient connected to face mask oxygen  Post-op Assessment: Report given to RN and Post -op Vital signs reviewed and stable  Post vital signs: Reviewed and stable  Last Vitals:  Filed Vitals:   01/19/16 0555  BP: 131/90  Pulse: 80  Temp: 36.7 C  Resp: 16    Last Pain: There were no vitals filed for this visit.       Complications: No apparent anesthesia complications

## 2016-01-19 NOTE — Op Note (Signed)
Pre-operative diagnosis :  Gross hematuria with retained symptomatic Right lower ureteral stone  Postoperative diagnosis:   Same  Operation:  Cystourethroscopy, Right retrograde pyelogram with interpretation, right ureteroscopy, laser fragmentation of right lower ureteral stone, basket extraction of right ureteral stone fragments, right JJ stent. ( 108F x 26 cm).   Findings: 78mm multifacited right lower ureteral stone,  Edematous Right ureteral orifice  Surgeon:  S. Gaynelle Arabian, MD  First assistant:  None  Anesthesia:  General LMA  Preparation:  After appropriate preanesthesia, the patient was brought the operative room, placed on the operating table in the dorsal supine position where general LMA anesthesia was introduced. He was then replaced in the dorsal lithotomy position with the pubis was prepped with Betadine solution and draped in usual fashion. The history was reviewed. The armband was double checked. CT scan was attempted to be evaluated in the operating room, but could not be brought up in the PACS system.  Review history:  Reason For Visit Rt flank pain   Active Problems Problems  1. Gross hematuria (R31.0) 2. Hypogonadism, testicular (E29.1) 3. Nephrolithiasis (N20.0) 4. 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 He was seen on 10/01/15 with a 3 day hx of intermittent Right flank stabbing pain, radiating to the RLQ, associated with brown urine ( old gross hematuria). No nausea or vomiting. He was presumed to pass a kidney stone, because it followed the same pattern as previous kidney stone passages while living in [place]. [place] ( calcium oxalate stones).   Following this episode, he developed urinary frequency and excessive thirst, and glycosuria. Glucose 265, and A1c 7.6. He was placed on insulin.   Note  PMH: papillary thyroid cancer, and hypogonadism, on TRT. However, he has been off Androgel for 3 weeks.   Diet: Nutitarian ( mostly vegetarian) x 8 yrs. Fast foods: rarely ( 1x/month). Sodas: none x 1 1/2 yrs. water ( 1/2 L + 5 glasses ), tea ( 2 glasses), coffee ( 6 cups)  tobacco: cigars with golf.   Statement of  Likelihood of Success: Excellent. TIME-OUT observed.:  Procedure:  Cystourethroscopy was accomplished, showing slight sub-meatal stenosis, which was dilated with the 21 cystoscope. The prostate was subocclusive, with bilateral prostate lobe hypertrophy, and slight bladder neck hypertrophy. The bladder showed no evidence of trabeculation, however, and no cellules or diverticula were noted. There was no evidence of bladder stone or tumor noted. The trigone was in normal position, and ureteral orifices were normal position. The right ureteral orifice was edematous.  Right retro-grade pyelogram was accomplished, but the overhead radiographic equipment was not functional. However, the main monitor did work, and I was able to evaluate the ureter by evaluating the main monitor from across the room. The ureter appeared mildly hydronephrotic, and no definite stone was identified, but there was a possible stone in the lower ureter, as noted on previous CT. No stones were seen in the kidney.  A 0.038 guidewire was passed into the kidney through the 6 [place] open-ended catheter. Following this, the double bridge ureteroscope was passed, into the right ureteral orifice, and through the right ureterovesical junction. A 5 mm right lower ureteral stone was identified and photographed. The 0.038 guidewire appeared to be submucosal at that point, because of obstruction of the stone deflecting the wire submucosally, and the wire was removed, and replaced within the ureter,  and coiled in the kidney again.  Again, the ureteroscope was passed, and again, the stone was visualized. The stone appeared to be  multifaceted, and multilocular. I elected to use the holmium laser for fragmentation, under low power. I used the 365  fiber, with power settings of 5/0.5, and fragmented the stone into 3 pieces, which were basket extracted into the bladder, and later irrigated free from the bladder and taken for examination. Repeat ureteroscopy showed no further stones within the ureter.  C-arm was brought into the operating room, for better visualization. Because of edema of the lower ureter and ureteral orifice, I elected to place a double-J catheter. Therefore, over the safety wire, a 6 [place] by 26 cm double-J stent was passed, and coiled in the right kidney, and in the bladder. At first, I elected to leave the suture attached, but the double-J backed out of the kidney, and was replaced, without the suture attachment. Then stayed in the kidney, and coiled in the bladder. The bladder was drained of fluid, with all stone removed. The patient was given IV Toradol, awakened and taken to recovery room in good condition.

## 2016-02-12 ENCOUNTER — Ambulatory Visit: Payer: Self-pay | Admitting: Internal Medicine

## 2016-02-18 ENCOUNTER — Ambulatory Visit: Payer: Self-pay | Admitting: Internal Medicine

## 2016-04-14 ENCOUNTER — Other Ambulatory Visit: Payer: Self-pay | Admitting: Internal Medicine

## 2016-04-14 DIAGNOSIS — E119 Type 2 diabetes mellitus without complications: Secondary | ICD-10-CM

## 2016-05-23 NOTE — Patient Instructions (Addendum)
Recommend Adult Low Dose Aspirin or  coated  Aspirin 81 mg daily  To reduce risk of Colon Cancer 20 %,  Skin Cancer 26 % ,  Melanoma 46%  and  Pancreatic cancer 60% ++++++++++++++++++++++++++++++++++++++++++++++++++++++ Vitamin D goal  is between 70-100.  Please make sure that you are taking your Vitamin D as directed.  It is very important as a natural anti-inflammatory  helping hair, skin, and nails, as well as reducing stroke and heart attack risk.  It helps your bones and helps with mood. It also decreases numerous cancer risks so please take it as directed.  Low Vit D is associated with a 200-300% higher risk for CANCER  and 200-300% higher risk for HEART   ATTACK  &  STROKE.   ...................................... It is also associated with higher death rate at younger ages,  autoimmune diseases like Rheumatoid arthritis, Lupus, Multiple Sclerosis.    Also many other serious conditions, like depression, Alzheimer's Dementia, infertility, muscle aches, fatigue, fibromyalgia - just to name a few. ++++++++++++++++++++++++++++++++++++++++++++++++ Recommend the book "The END of DIETING" by Dr Joel Fuhrman  & the book "The END of DIABETES " by Dr Joel Fuhrman At [employer] - get book & Audio CD's    Being diabetic has a  300% increased risk for heart attack, stroke, cancer, and alzheimer- type vascular dementia. It is very important that you work harder with diet by avoiding all foods that are white. Avoid white rice (brown & wild rice is OK), white potatoes (sweetpotatoes in moderation is OK), White bread or wheat bread or anything made out of white flour like bagels, donuts, rolls, buns, biscuits, cakes, pastries, cookies, pizza crust, and pasta (made from white flour & egg whites) - vegetarian pasta or spinach or wheat pasta is OK. Multigrain breads like Arnold's or Pepperidge Farm, or multigrain sandwich thins or flatbreads.  Diet, exercise and weight loss can reverse and cure diabetes  in the early stages.  Diet, exercise and weight loss is very important in the control and prevention of complications of diabetes which affects every system in your body, ie. Brain - dementia/stroke, eyes - glaucoma/blindness, heart - heart attack/heart failure, kidneys - dialysis, stomach - gastric paralysis, intestines - malabsorption, nerves - severe painful neuritis, circulation - gangrene & loss of a leg(s), and finally cancer and Alzheimers.    I recommend avoid fried & greasy foods,  sweets/candy, white rice (brown or wild rice or Quinoa is OK), white potatoes (sweet potatoes are OK) - anything made from white flour - bagels, doughnuts, rolls, buns, biscuits,white and wheat breads, pizza crust and traditional pasta made of white flour & egg white(vegetarian pasta or spinach or wheat pasta is OK).  Multi-grain bread is OK - like multi-grain flat bread or sandwich thins. Avoid alcohol in excess. Exercise is also important.    Eat all the vegetables you want - avoid meat, especially red meat and dairy - especially cheese.  Cheese is the most concentrated form of trans-fats which is the worst thing to clog up our arteries. Veggie cheese is OK which can be found in the fresh produce section at [employer] or [employer] or [employer]  ++++++++++++++++++++++++++++++++++++++++++++++++++ DASH Eating Plan  DASH stands for "Dietary Approaches to Stop Hypertension."   The DASH eating plan is a healthy eating plan that has been shown to reduce high blood pressure (hypertension). Additional health benefits may include reducing the risk of type 2 diabetes mellitus, heart disease, and stroke. The DASH eating plan may also   help with weight loss. WHAT DO I NEED TO KNOW ABOUT THE DASH EATING PLAN? For the DASH eating plan, you will follow these general guidelines:  Choose foods with a percent daily value for sodium of less than 5% (as listed on the food label).  Use salt-free seasonings or herbs instead of  table salt or sea salt.  Check with your health care provider or pharmacist before using salt substitutes.  Eat lower-sodium products, often labeled as "lower sodium" or "no salt added."  Eat fresh foods.  Eat more vegetables, fruits, and low-fat dairy products.  Choose whole grains. Look for the word "whole" as the first word in the ingredient list.  Choose fish   Limit sweets, desserts, sugars, and sugary drinks.  Choose heart-healthy fats.  Eat veggie cheese   Eat more home-cooked food and less restaurant, buffet, and fast food.  Limit fried foods.  Cook foods using methods other than frying.  Limit canned vegetables. If you do use them, rinse them well to decrease the sodium.  When eating at a restaurant, ask that your food be prepared with less salt, or no salt if possible.                      WHAT FOODS CAN I EAT? Read Dr Joel Fuhrman's books on The End of Dieting & The End of Diabetes  Grains Whole grain or whole wheat bread. Brown rice. Whole grain or whole wheat pasta. Quinoa, bulgur, and whole grain cereals. Low-sodium cereals. Corn or whole wheat flour tortillas. Whole grain cornbread. Whole grain crackers. Low-sodium crackers.  Vegetables Fresh or frozen vegetables (raw, steamed, roasted, or grilled). Low-sodium or reduced-sodium tomato and vegetable juices. Low-sodium or reduced-sodium tomato sauce and paste. Low-sodium or reduced-sodium canned vegetables.   Fruits All fresh, canned (in natural juice), or frozen fruits.  Protein Products  All fish and seafood.  Dried beans, peas, or lentils. Unsalted nuts and seeds. Unsalted canned beans.  Dairy Low-fat dairy products, such as skim or 1% milk, 2% or reduced-fat cheeses, low-fat ricotta or cottage cheese, or plain low-fat yogurt. Low-sodium or reduced-sodium cheeses.  Fats and Oils Tub margarines without trans fats. Light or reduced-fat mayonnaise and salad dressings (reduced sodium). Avocado. Safflower,  olive, or canola oils. Natural peanut or almond butter.  Other Unsalted popcorn and pretzels. The items listed above may not be a complete list of recommended foods or beverages. Contact your dietitian for more options.  +++++++++++++++++++++++++++++++++++++++++++  WHAT FOODS ARE NOT RECOMMENDED? Grains/ White flour or wheat flour White bread. White pasta. White rice. Refined cornbread. Bagels and croissants. Crackers that contain trans fat.  Vegetables  Creamed or fried vegetables. Vegetables in a . Regular canned vegetables. Regular canned tomato sauce and paste. Regular tomato and vegetable juices.  Fruits Dried fruits. Canned fruit in light or heavy syrup. Fruit juice.  Meat and Other Protein Products Meat in general - RED meat & White meat.  Fatty cuts of meat. Ribs, chicken wings, all processed meats as bacon, sausage, bologna, salami, fatback, hot dogs, bratwurst and packaged luncheon meats.  Dairy Whole or 2% milk, cream, half-and-half, and cream cheese. Whole-fat or sweetened yogurt. Full-fat cheeses or blue cheese. Non-dairy creamers and whipped toppings. Processed cheese, cheese spreads, or cheese curds.  Condiments Onion and garlic salt, seasoned salt, table salt, and sea salt. Canned and packaged gravies. Worcestershire sauce. Tartar sauce. Barbecue sauce. Teriyaki sauce. Soy sauce, including reduced sodium. Steak sauce. Fish sauce. Oyster sauce. Cocktail   sauce. Horseradish. Ketchup and mustard. Meat flavorings and tenderizers. Bouillon cubes. Hot sauce. Tabasco sauce. Marinades. Taco seasonings. Relishes.  Fats and Oils Butter, stick margarine, lard, shortening and bacon fat. Coconut, palm kernel, or palm oils. Regular salad dressings.  Pickles and olives. Salted popcorn and pretzels.  The items listed above may not be a complete list of foods and beverages to avoid.  Recommend Adult Low Dose Aspirin or  coated  Aspirin 81 mg daily  To reduce risk of Colon Cancer 20  %,  Skin Cancer 26 % ,  Melanoma 46%  and  Pancreatic cancer 60% ++++++++++++++++++++++++++++++++++++++++++++++++++++++ Vitamin D goal  is between 70-100.  Please make sure that you are taking your Vitamin D as directed.  It is very important as a natural anti-inflammatory  helping hair, skin, and nails, as well as reducing stroke and heart attack risk.  It helps your bones and helps with mood. It also decreases numerous cancer risks so please take it as directed.  Low Vit D is associated with a 200-300% higher risk for CANCER  and 200-300% higher risk for HEART   ATTACK  &  STROKE.   ...................................... It is also associated with higher death rate at younger ages,  autoimmune diseases like Rheumatoid arthritis, Lupus, Multiple Sclerosis.    Also many other serious conditions, like depression, Alzheimer's Dementia, infertility, muscle aches, fatigue, fibromyalgia - just to name a few. ++++++++++++++++++++++++++++++++++++++++++++++++ Recommend the book "The END of DIETING" by Dr Joel Fuhrman  & the book "The END of DIABETES " by Dr Joel Fuhrman At [employer] - get book & Audio CD's    Being diabetic has a  300% increased risk for heart attack, stroke, cancer, and alzheimer- type vascular dementia. It is very important that you work harder with diet by avoiding all foods that are white. Avoid white rice (brown & wild rice is OK), white potatoes (sweetpotatoes in moderation is OK), White bread or wheat bread or anything made out of white flour like bagels, donuts, rolls, buns, biscuits, cakes, pastries, cookies, pizza crust, and pasta (made from white flour & egg whites) - vegetarian pasta or spinach or wheat pasta is OK. Multigrain breads like Arnold's or Pepperidge Farm, or multigrain sandwich thins or flatbreads.  Diet, exercise and weight loss can reverse and cure diabetes in the early stages.  Diet, exercise and weight loss is very important in the control and prevention  of complications of diabetes which affects every system in your body, ie. Brain - dementia/stroke, eyes - glaucoma/blindness, heart - heart attack/heart failure, kidneys - dialysis, stomach - gastric paralysis, intestines - malabsorption, nerves - severe painful neuritis, circulation - gangrene & loss of a leg(s), and finally cancer and Alzheimers.    I recommend avoid fried & greasy foods,  sweets/candy, white rice (brown or wild rice or Quinoa is OK), white potatoes (sweet potatoes are OK) - anything made from white flour - bagels, doughnuts, rolls, buns, biscuits,white and wheat breads, pizza crust and traditional pasta made of white flour & egg white(vegetarian pasta or spinach or wheat pasta is OK).  Multi-grain bread is OK - like multi-grain flat bread or sandwich thins. Avoid alcohol in excess. Exercise is also important.    Eat all the vegetables you want - avoid meat, especially red meat and dairy - especially cheese.  Cheese is the most concentrated form of trans-fats which is the worst thing to clog up our arteries. Veggie cheese is OK which can be found in the fresh   produce section at [employer] or [employer] or [employer]  ++++++++++++++++++++++++++++++++++++++++++++++++++ DASH Eating Plan  DASH stands for "Dietary Approaches to Stop Hypertension."   The DASH eating plan is a healthy eating plan that has been shown to reduce high blood pressure (hypertension). Additional health benefits may include reducing the risk of type 2 diabetes mellitus, heart disease, and stroke. The DASH eating plan may also help with weight loss. WHAT DO I NEED TO KNOW ABOUT THE DASH EATING PLAN? For the DASH eating plan, you will follow these general guidelines:  Choose foods with a percent daily value for sodium of less than 5% (as listed on the food label).  Use salt-free seasonings or herbs instead of table salt or sea salt.  Check with your health care provider or pharmacist before using salt  substitutes.  Eat lower-sodium products, often labeled as "lower sodium" or "no salt added."  Eat fresh foods.  Eat more vegetables, fruits, and low-fat dairy products.  Choose whole grains. Look for the word "whole" as the first word in the ingredient list.  Choose fish   Limit sweets, desserts, sugars, and sugary drinks.  Choose heart-healthy fats.  Eat veggie cheese   Eat more home-cooked food and less restaurant, buffet, and fast food.  Limit fried foods.  Cook foods using methods other than frying.  Limit canned vegetables. If you do use them, rinse them well to decrease the sodium.  When eating at a restaurant, ask that your food be prepared with less salt, or no salt if possible.                      WHAT FOODS CAN I EAT? Read Dr Joel Fuhrman's books on The End of Dieting & The End of Diabetes  Grains Whole grain or whole wheat bread. Brown rice. Whole grain or whole wheat pasta. Quinoa, bulgur, and whole grain cereals. Low-sodium cereals. Corn or whole wheat flour tortillas. Whole grain cornbread. Whole grain crackers. Low-sodium crackers.  Vegetables Fresh or frozen vegetables (raw, steamed, roasted, or grilled). Low-sodium or reduced-sodium tomato and vegetable juices. Low-sodium or reduced-sodium tomato sauce and paste. Low-sodium or reduced-sodium canned vegetables.   Fruits All fresh, canned (in natural juice), or frozen fruits.  Protein Products  All fish and seafood.  Dried beans, peas, or lentils. Unsalted nuts and seeds. Unsalted canned beans.  Dairy Low-fat dairy products, such as skim or 1% milk, 2% or reduced-fat cheeses, low-fat ricotta or cottage cheese, or plain low-fat yogurt. Low-sodium or reduced-sodium cheeses.  Fats and Oils Tub margarines without trans fats. Light or reduced-fat mayonnaise and salad dressings (reduced sodium). Avocado. Safflower, olive, or canola oils. Natural peanut or almond butter.  Other Unsalted popcorn and  pretzels. The items listed above may not be a complete list of recommended foods or beverages. Contact your dietitian for more options.  +++++++++++++++++++++++++++++++++++++++++++  WHAT FOODS ARE NOT RECOMMENDED? Grains/ White flour or wheat flour White bread. White pasta. White rice. Refined cornbread. Bagels and croissants. Crackers that contain trans fat.  Vegetables  Creamed or fried vegetables. Vegetables in a . Regular canned vegetables. Regular canned tomato sauce and paste. Regular tomato and vegetable juices.  Fruits Dried fruits. Canned fruit in light or heavy syrup. Fruit juice.  Meat and Other Protein Products Meat in general - RED meat & White meat.  Fatty cuts of meat. Ribs, chicken wings, all processed meats as bacon, sausage, bologna, salami, fatback, hot dogs, bratwurst and packaged luncheon meats.  Dairy   Whole or 2% milk, cream, half-and-half, and cream cheese. Whole-fat or sweetened yogurt. Full-fat cheeses or blue cheese. Non-dairy creamers and whipped toppings. Processed cheese, cheese spreads, or cheese curds.  Condiments Onion and garlic salt, seasoned salt, table salt, and sea salt. Canned and packaged gravies. Worcestershire sauce. Tartar sauce. Barbecue sauce. Teriyaki sauce. Soy sauce, including reduced sodium. Steak sauce. Fish sauce. Oyster sauce. Cocktail sauce. Horseradish. Ketchup and mustard. Meat flavorings and tenderizers. Bouillon cubes. Hot sauce. Tabasco sauce. Marinades. Taco seasonings. Relishes.  Fats and Oils Butter, stick margarine, lard, shortening and bacon fat. Coconut, palm kernel, or palm oils. Regular salad dressings.  Pickles and olives. Salted popcorn and pretzels.  The items listed above may not be a complete list of foods and beverages to avoid.   

## 2016-05-23 NOTE — Progress Notes (Signed)
[facility] [name], M.D.        Uvaldo Bristle. Silverio Lay, P.A.-C       Starlyn Skeans, P.A.-C  [facility]                [address] [place], [place] [id number] Telephone [phone] Telefax [phone] ______________________________________________________________________     This very nice 55 y.o. MWM presents for 6 month follow up with Hypertension, Hyperlipidemia, T1_IDDM and Vitamin D Deficiency. Patient had recent (May) retrograde cysto for kidney stone extraction.      Patient has hx/o labile HTN since 2013 and has been followed expectantly. BP has been controlled and today's BP is 132/86. Patient has had no complaints of any cardiac type chest pain, palpitations, dyspnea/orthopnea/PND, dizziness, claudication, or dependent edema. Patient also is on CPAP for OSA with improved sleep hygiene and less daytime fatigue. Marland Kitchen      Hyperlipidemia is not controlled with diet & meds. Patient denies myalgias or other med SE's. Last Lipids were not at goal   Lab Results  Component Value Date   CHOL 139 05/24/2016   HDL 46 05/24/2016   LDLCALC 79 05/24/2016   LDLDIRECT 127.3 10/01/2009   TRIG 68 05/24/2016   CHOLHDL 3.0 05/24/2016      Also, the patient has history of LADA (Latent AutoImmune Diabetes Mellitus of Adults) and was initiated on insulin Mar 2017.  Currently he is on Bid dosing of Humulin 75/25 mix (10+15 units work days and 12=12 units days off)  and he has had no symptoms of reactive hypoglycemia, diabetic polys, paresthesias or visual blurring.  Last A1c was 10.4% and not at goal.     Fraser Din has hx/o Low Testosterone & is on replacement with improved sense of well-being. Also patient had thyroidectomy in 2001 for Thyroid Cancer and is on replacement /supressive therapy.  Further, the patient also has history of Vitamin D Deficiency in 2011 with a level of "11" and sporadically  supplements vitamin  D without any suspected side-effects. Last vitamin D was still low:   Lab Results  Component Value Date   VD25OH 32 11/12/2015   Current Outpatient Prescriptions on File Prior to Visit  Medication Sig  . atorvastatin 80 MG tablet Take 40 mg by mouth at bedtime. )  . VITAMIN D Take 4,000 Int'l Units by mouth daily.  . hyoscyamine SL 0.125 MG SL tablet Place 1 tab under the tongue every 4  hrs as needed   . ibuprofen  200 MG tablet Take 400 mgevery 6 hours as needed   . HUMALOG 75/25 MIX Kwikpen Inject 12-16 Units into the skin 2 times daily with a meal. 12units at 6am, 16units at 6pm.  . levothyroxine  175 MCG tablet Take 1 tab daily before breakfast.  . metFORMIN-XR 500 MG TAKE 1 TAB w/  BKFST & LUNCH & 2  TAB w/ SUPPER   . TESTIM 50 MG/5GM (1%) GEL Place 5 g onto the skin daily.  Marland Kitchen ULTRACET 37.5-325  Take 1-2 tab every 6  hours as needed   Allergies  Allergen Reactions  . Shellfish Allergy Anaphylaxis and Rash   PMHx:   Past Medical History:  Diagnosis Date  . Anxiety   . Cancer ([employer])    thyroid  . Complication of anesthesia 2001   panic attack when going under anesthesia  . Diabetes mellitus without  complication ([place])    type 2  . Heart murmur    as a teen  . Hyperlipidemia   . Hypothyroidism   . Kidney stones   . Other testicular hypofunction   . PONV (postoperative nausea and vomiting)   . Sleep apnea    Immunization History  Administered Date(s) Administered  . Influenza Split 06/12/2013  . Influenza Whole 06/30/2009  . PPD Test 10/29/2013, 10/30/2014, 11/12/2015  . Td 01/08/2004  . Tdap 10/29/2013   Past Surgical History:  Procedure Laterality Date  . CYSTOSCOPY WITH HOLMIUM LASER LITHOTRIPSY Right 01/19/2016   Procedure: CYSTOSCOPY WITH HOLMIUM LASER LITHOTRIPSY;  Surgeon: Carolan Clines, MD;  Location: [facility];  Service: Urology;  Laterality: Right;  . CYSTOSCOPY/RETROGRADE/URETEROSCOPY/STONE EXTRACTION WITH BASKET Right 01/19/2016   Procedure:  CYSTO/RIGHT RETROGRADE PYELOGRAM/URETEROSCOPY/STONE EXTRACTION WITH BASKET AND STENT PLACEMENT;  Surgeon: Carolan Clines, MD;  Location: [facility];  Service: Urology;  Laterality: Right;  . THYROIDECTOMY  2001   FHx:    Reviewed / unchanged  SHx:    Reviewed / unchanged  Systems Review:  Constitutional: Denies fever, chills, wt changes, headaches, insomnia, fatigue, night sweats, change in appetite. Eyes: Denies redness, blurred vision, diplopia, discharge, itchy, watery eyes.  ENT: Denies discharge, congestion, post nasal drip, epistaxis, sore throat, earache, hearing loss, dental pain, tinnitus, vertigo, sinus pain, snoring.  CV: Denies chest pain, palpitations, irregular heartbeat, syncope, dyspnea, diaphoresis, orthopnea, PND, claudication or edema. Respiratory: denies cough, dyspnea, DOE, pleurisy, hoarseness, laryngitis, wheezing.  Gastrointestinal: Denies dysphagia, odynophagia, heartburn, reflux, water brash, abdominal pain or cramps, nausea, vomiting, bloating, diarrhea, constipation, hematemesis, melena, hematochezia  or hemorrhoids. Genitourinary: Denies dysuria, frequency, urgency, nocturia, hesitancy, discharge, hematuria or flank pain. Musculoskeletal: Denies arthralgias, myalgias, stiffness, jt. swelling, pain, limping or strain/sprain.  Skin: Denies pruritus, rash, hives, warts, acne, eczema or change in skin lesion(s). Neuro: No weakness, tremor, incoordination, spasms, paresthesia or pain. Psychiatric: Denies confusion, memory loss or sensory loss. Endo: Denies change in weight, skin or hair change.  Heme/Lymph: No excessive bleeding, bruising or enlarged lymph nodes.  Physical Exam BP 132/86   Pulse 60   Temp 97.7 F (36.5 C)   Resp 16   Ht 6' 5.5" (1.969 m)   Wt 224 lb 6.4 oz (101.8 kg)   BMI 26.27 kg/m   Appears well nourished and in no distress.  Eyes: PERRLA, EOMs, conjunctiva no swelling or erythema. Sinuses: No frontal/maxillary tenderness ENT/Mouth:  EAC's clear, TM's nl w/o erythema, bulging. Nares clear w/o erythema, swelling, exudates. Oropharynx clear without erythema or exudates. Oral hygiene is good. Tongue normal, non obstructing. Hearing intact.  Neck: Supple. Thyroid nl. Car 2+/2+ without bruits, nodes or JVD. Chest: Respirations nl with BS clear & equal w/o rales, rhonchi, wheezing or stridor.  Cor: Heart sounds normal w/ regular rate and rhythm without sig. murmurs, gallops, clicks, or rubs. Peripheral pulses normal and equal  without edema.  Abdomen: Soft & bowel sounds normal. Non-tender w/o guarding, rebound, hernias, masses, or organomegaly.  Lymphatics: Unremarkable.  Musculoskeletal: Full ROM all peripheral extremities, joint stability, 5/5 strength, and normal gait.  Skin: Warm, dry without exposed rashes, lesions or ecchymosis apparent.  Neuro: Cranial nerves intact, reflexes equal bilaterally. Sensory-motor testing grossly intact. Tendon reflexes grossly intact.  Pysch: Alert & oriented x 3.  Insight and judgement nl & appropriate. No ideations.  Assessment and Plan:   1. Elevated blood pressure reading without diagnosis of hypertension  - Continue monitor blood pressure at home. Continue DASH diet. Reminder to  go to the ER if any CP, SOB, nausea, dizziness, severe HA, changes vision/speech, left arm numbness and tingling and jaw pain.  2. Hyperlipidemia  - Continue diet/meds, exercise,& lifestyle modifications. Continue monitor periodic cholesterol/liver & renal functions  - Lipid panel - TSH  3. LADA (latent autoimmune diabetes in adults), managed as type 1 ([place])  - Continue diet, exercise, lifestyle modifications. Monitor appropriate labs. - Hemoglobin A1c  4. Vitamin D deficiency  - Continue supplementation. - VITAMIN D 25 Hydroxy   5. Hypothyroidism   6. Testosterone deficiency  - Testosterone  7. Medication management  - CBC with Differential/Platelet - BASIC METABOLIC PANEL WITH GFR -  Hepatic function panel - Magnesium      Recommended regular exercise, BP monitoring, weight control, and discussed med and SE's. Recommended labs to assess and monitor clinical status. Further disposition pending results of labs. Over 30 minutes of exam, counseling, chart review was performed

## 2016-05-24 ENCOUNTER — Ambulatory Visit (INDEPENDENT_AMBULATORY_CARE_PROVIDER_SITE_OTHER): Payer: 59 | Admitting: Internal Medicine

## 2016-05-24 ENCOUNTER — Encounter: Payer: Self-pay | Admitting: Internal Medicine

## 2016-05-24 VITALS — BP 132/86 | HR 60 | Temp 97.7°F | Resp 16 | Ht 77.5 in | Wt 224.4 lb

## 2016-05-24 DIAGNOSIS — R030 Elevated blood-pressure reading, without diagnosis of hypertension: Secondary | ICD-10-CM | POA: Diagnosis not present

## 2016-05-24 DIAGNOSIS — E785 Hyperlipidemia, unspecified: Secondary | ICD-10-CM | POA: Diagnosis not present

## 2016-05-24 DIAGNOSIS — E559 Vitamin D deficiency, unspecified: Secondary | ICD-10-CM

## 2016-05-24 DIAGNOSIS — E291 Testicular hypofunction: Secondary | ICD-10-CM

## 2016-05-24 DIAGNOSIS — E139 Other specified diabetes mellitus without complications: Secondary | ICD-10-CM

## 2016-05-24 DIAGNOSIS — E039 Hypothyroidism, unspecified: Secondary | ICD-10-CM | POA: Diagnosis not present

## 2016-05-24 DIAGNOSIS — Z79899 Other long term (current) drug therapy: Secondary | ICD-10-CM | POA: Diagnosis not present

## 2016-05-24 DIAGNOSIS — E349 Endocrine disorder, unspecified: Secondary | ICD-10-CM

## 2016-05-24 LAB — HEPATIC FUNCTION PANEL
ALT: 19 U/L (ref 9–46)
AST: 18 U/L (ref 10–35)
Albumin: 4.3 g/dL (ref 3.6–5.1)
Alkaline Phosphatase: 49 U/L (ref 40–115)
BILIRUBIN INDIRECT: 0.4 mg/dL (ref 0.2–1.2)
Bilirubin, Direct: 0.1 mg/dL (ref <=0.2)
TOTAL PROTEIN: 6.7 g/dL (ref 6.1–8.1)
Total Bilirubin: 0.5 mg/dL (ref 0.2–1.2)

## 2016-05-24 LAB — CBC WITH DIFFERENTIAL/PLATELET
BASOS ABS: 52 {cells}/uL (ref 0–200)
Basophils Relative: 1 %
EOS ABS: 104 {cells}/uL (ref 15–500)
Eosinophils Relative: 2 %
HEMATOCRIT: 41.6 % (ref 38.5–50.0)
HEMOGLOBIN: 14.3 g/dL (ref 13.2–17.1)
LYMPHS ABS: 1872 {cells}/uL (ref 850–3900)
Lymphocytes Relative: 36 %
MCH: 32.1 pg (ref 27.0–33.0)
MCHC: 34.4 g/dL (ref 32.0–36.0)
MCV: 93.3 fL (ref 80.0–100.0)
MONO ABS: 572 {cells}/uL (ref 200–950)
MPV: 9.3 fL (ref 7.5–12.5)
Monocytes Relative: 11 %
NEUTROS PCT: 50 %
Neutro Abs: 2600 cells/uL (ref 1500–7800)
Platelets: 238 10*3/uL (ref 140–400)
RBC: 4.46 MIL/uL (ref 4.20–5.80)
RDW: 12.6 % (ref 11.0–15.0)
WBC: 5.2 10*3/uL (ref 3.8–10.8)

## 2016-05-24 LAB — LIPID PANEL
CHOLESTEROL: 139 mg/dL (ref 125–200)
HDL: 46 mg/dL (ref >=40)
LDL CALC: 79 mg/dL (ref <130)
Total CHOL/HDL Ratio: 3 Ratio (ref <=5.0)
Triglycerides: 68 mg/dL (ref <150)
VLDL: 14 mg/dL (ref <30)

## 2016-05-24 LAB — BASIC METABOLIC PANEL WITH GFR
BUN: 21 mg/dL (ref 7–25)
CALCIUM: 9.4 mg/dL (ref 8.6–10.3)
CO2: 29 mmol/L (ref 20–31)
Chloride: 104 mmol/L (ref 98–110)
Creat: 1.12 mg/dL (ref 0.70–1.33)
GFR, EST NON AFRICAN AMERICAN: 73 mL/min (ref >=60)
GFR, Est African American: 84 mL/min (ref >=60)
GLUCOSE: 99 mg/dL (ref 65–99)
Potassium: 4 mmol/L (ref 3.5–5.3)
Sodium: 141 mmol/L (ref 135–146)

## 2016-05-24 LAB — MAGNESIUM: Magnesium: 2 mg/dL (ref 1.5–2.5)

## 2016-05-24 LAB — TSH: TSH: 14.95 m[IU]/L — AB (ref 0.40–4.50)

## 2016-05-25 LAB — HEMOGLOBIN A1C
HEMOGLOBIN A1C: 5.8 % — AB (ref <5.7)
Mean Plasma Glucose: 120 mg/dL

## 2016-05-25 LAB — VITAMIN D 25 HYDROXY (VIT D DEFICIENCY, FRACTURES): VIT D 25 HYDROXY: 41 ng/mL (ref 30–100)

## 2016-09-01 ENCOUNTER — Ambulatory Visit: Payer: Self-pay | Admitting: Physician Assistant

## 2016-09-01 NOTE — Progress Notes (Deleted)
Assessment and Plan:    Continue diet and meds as discussed. Further disposition pending results of labs.  HPI 56 y.o. male  presents for 3 month follow up with hypertension, hyperlipidemia, prediabetes and vitamin D. His blood pressure has been controlled at home, today their BP is   He does not workout but he walks a lot at work and he also goes to the pool during the summer. He denies chest pain, shortness of breath, dizziness.  He is on cholesterol medication, 1/2 of lipitor a day, and he has been listening to [employer] and would like to try to get off his cholesterol med and denies myalgias. His cholesterol is at goal. The cholesterol last visit was:   Lab Results  Component Value Date   CHOL 139 05/24/2016   HDL 46 05/24/2016   LDLCALC 79 05/24/2016   LDLDIRECT 127.3 10/01/2009   TRIG 68 05/24/2016   CHOLHDL 3.0 05/24/2016   Patient has LADA, initiated on insulin March 2017, doing very well with insulin, on 75/25 mix (10+15 units work days and 12+12 units days off).  Lab Results  Component Value Date   HGBA1C 5.8 (H) 05/24/2016   Patient is on Vitamin D supplement.   He is on thyroid medication. His medication was changed last visit. Patient denies nervousness, palpitations and weight changes.  Lab Results  Component Value Date   TSH 14.95 (H) 05/24/2016  He is has a history of testosterone deficiency, last shot was friday and is on testosterone replacement. He states that the testosterone helps with his energy, libido, muscle mass.   Current Medications:  Current Outpatient Prescriptions on File Prior to Visit  Medication Sig Dispense Refill  . atorvastatin (LIPITOR) 80 MG tablet Take 1 tablet (80 mg total) by mouth at bedtime. (Patient taking differently: Take 40 mg by mouth at bedtime. ) 90 tablet 3  . BD PEN NEEDLE NANO U/F 32G X 4 MM MISC USE TWICE A DAY AS DIRECTED  11  . Cholecalciferol (VITAMIN D PO) Take 4,000 Int'l Units by mouth daily.    Marland Kitchen glucose blood  (FREESTYLE INSULINX TEST) test strip Check blood sugar 3 times daily-DX-E10.65 100 each 12  . glucose blood test strip     . hyoscyamine (LEVSIN/SL) 0.125 MG SL tablet Place 1 tablet (0.125 mg total) under the tongue every 4 (four) hours as needed for cramping (will cause dry mouth, constipation). 30 tablet 0  . ibuprofen (ADVIL,MOTRIN) 200 MG tablet Take 400 mg by mouth every 6 (six) hours as needed for headache or mild pain.    . Insulin Lispro Prot & Lispro (HUMALOG 75/25 MIX) (75-25) 100 UNIT/ML Kwikpen Inject 20 units subcutaneously with first meal of the day.  Inject  subcutaneously 10 units with your largest meal of the day prior to bed. (Patient taking differently: Inject 12-16 Units into the skin 2 (two) times daily with a meal. 12units at 6am, 16units at 6pm.) 27 mL 4  . levothyroxine (SYNTHROID, LEVOTHROID) 175 MCG tablet Take 1 tablet (175 mcg total) by mouth daily before breakfast. 90 tablet 3  . metFORMIN (GLUCOPHAGE-XR) 500 MG 24 hr tablet TAKE 1 TABLET WITH  BREAKFAST AND LUNCH, AND 2  TABLETS WITH SUPPER FOR  DIABETES 360 tablet 1  . testosterone (TESTIM) 50 MG/5GM (1%) GEL Place 5 g onto the skin daily. 90 Tube 1  . traMADol-acetaminophen (ULTRACET) 37.5-325 MG tablet Take 1-2 tablets by mouth every 6 (six) hours as needed for moderate pain. 30 tablet 1  .  trimethoprim (TRIMPEX) 100 MG tablet Take 1 tablet (100 mg total) by mouth 1 day or 1 dose. (Patient not taking: Reported on 05/24/2016) 30 tablet 1   No current facility-administered medications on file prior to visit.    Medical History:  Past Medical History:  Diagnosis Date  . Anxiety   . Cancer ([place])    thyroid  . Complication of anesthesia 2001   panic attack when going under anesthesia  . Diabetes mellitus without complication ([place])    type 2  . Heart murmur    as a teen  . Hyperlipidemia   . Hypothyroidism   . Kidney stones   . Other testicular hypofunction   . PONV (postoperative nausea and vomiting)   .  Sleep apnea    Allergies:  Allergies  Allergen Reactions  . Shellfish Allergy Anaphylaxis and Rash    ROS   Family history- Review and unchanged Social history- Review and unchanged Physical Exam: There were no vitals taken for this visit. Wt Readings from Last 3 Encounters:  05/24/16 224 lb 6.4 oz (101.8 kg)  01/06/16 219 lb (99.3 kg)  12/30/15 227 lb 6.4 oz (103.1 kg)   General Appearance: Well nourished, in no apparent distress. Eyes: PERRLA, EOMs, conjunctiva no swelling or erythema Sinuses: No Frontal/maxillary tenderness ENT/Mouth: Ext aud canals clear, TMs without erythema, bulging. No erythema, swelling, or exudate on post pharynx.  Tonsils not swollen or erythematous. Hearing normal.  Neck: Supple, thyroid normal.  Respiratory: Respiratory effort normal, BS equal bilaterally without rales, rhonchi, wheezing or stridor.  Cardio: RRR with no MRGs. Brisk peripheral pulses without edema.  Abdomen: Soft, + BS.  Non tender, no guarding, rebound, hernias, masses. Lymphatics: Non tender without lymphadenopathy.  Musculoskeletal: Full ROM, 5/5 strength, normal gait.  Skin: Warm, dry without rashes, lesions, ecchymosis.  Neuro: Cranial nerves intact. Normal muscle tone, no cerebellar symptoms. Sensation intact.  Psych: Awake and oriented X 3, normal affect, Insight and Judgment appropriate.    Vicie Mutters 8:40 AM

## 2016-10-13 ENCOUNTER — Ambulatory Visit (INDEPENDENT_AMBULATORY_CARE_PROVIDER_SITE_OTHER): Payer: 59 | Admitting: Physician Assistant

## 2016-10-13 ENCOUNTER — Encounter: Payer: Self-pay | Admitting: Physician Assistant

## 2016-10-13 VITALS — BP 128/82 | HR 82 | Temp 97.7°F | Resp 16 | Ht 77.0 in | Wt 226.8 lb

## 2016-10-13 DIAGNOSIS — E349 Endocrine disorder, unspecified: Secondary | ICD-10-CM | POA: Diagnosis not present

## 2016-10-13 DIAGNOSIS — E559 Vitamin D deficiency, unspecified: Secondary | ICD-10-CM

## 2016-10-13 DIAGNOSIS — E109 Type 1 diabetes mellitus without complications: Secondary | ICD-10-CM

## 2016-10-13 DIAGNOSIS — R030 Elevated blood-pressure reading, without diagnosis of hypertension: Secondary | ICD-10-CM

## 2016-10-13 DIAGNOSIS — Z79899 Other long term (current) drug therapy: Secondary | ICD-10-CM | POA: Diagnosis not present

## 2016-10-13 DIAGNOSIS — Z9989 Dependence on other enabling machines and devices: Secondary | ICD-10-CM

## 2016-10-13 DIAGNOSIS — E139 Other specified diabetes mellitus without complications: Secondary | ICD-10-CM

## 2016-10-13 DIAGNOSIS — E785 Hyperlipidemia, unspecified: Secondary | ICD-10-CM

## 2016-10-13 DIAGNOSIS — G4733 Obstructive sleep apnea (adult) (pediatric): Secondary | ICD-10-CM

## 2016-10-13 DIAGNOSIS — E039 Hypothyroidism, unspecified: Secondary | ICD-10-CM

## 2016-10-13 LAB — CBC WITH DIFFERENTIAL/PLATELET
BASOS PCT: 1 %
Basophils Absolute: 60 cells/uL (ref 0–200)
EOS ABS: 60 {cells}/uL (ref 15–500)
EOS PCT: 1 %
HCT: 40.4 % (ref 38.5–50.0)
Hemoglobin: 13.6 g/dL (ref 13.2–17.1)
LYMPHS PCT: 23 %
Lymphs Abs: 1380 cells/uL (ref 850–3900)
MCH: 31.5 pg (ref 27.0–33.0)
MCHC: 33.7 g/dL (ref 32.0–36.0)
MCV: 93.5 fL (ref 80.0–100.0)
MONOS PCT: 9 %
MPV: 9.3 fL (ref 7.5–12.5)
Monocytes Absolute: 540 cells/uL (ref 200–950)
NEUTROS ABS: 3960 {cells}/uL (ref 1500–7800)
Neutrophils Relative %: 66 %
PLATELETS: 280 10*3/uL (ref 140–400)
RBC: 4.32 MIL/uL (ref 4.20–5.80)
RDW: 13.2 % (ref 11.0–15.0)
WBC: 6 10*3/uL (ref 3.8–10.8)

## 2016-10-13 LAB — TSH: TSH: 4.33 mIU/L (ref 0.40–4.50)

## 2016-10-13 NOTE — Progress Notes (Signed)
Patient ID: Daniel Case, male   DOB: [birth year], 56 y.o.   MRN: [id number]  Assessment and Plan:  Hypertension:  -Continue medication,  -monitor blood pressure at home.  -Continue DASH diet.   -Reminder to go to the ER if any CP, SOB, nausea, dizziness, severe HA, changes vision/speech, left arm numbness and tingling, and jaw pain.  Cholesterol: -Continue diet and exercise.  -Check cholesterol.   Diabetes due to LADA -Continue diet and exercise.  -Check A1C  Vitamin D Def: -check level -continue medications.   Hypothyroidism -check TSH level, continue medications the same, reminded to take on an empty stomach 30-20mins before food.   Continue diet and meds as discussed. Further disposition pending results of labs. Future Appointments Date Time Provider [place]  12/15/2016 10:00 AM [name], MD [facility] None   HPI 56 y.o. male  presents for 3 month follow up with hypertension, hyperlipidemia, prediabetes and vitamin D.   His blood pressure has been controlled at home, today their BP is BP: 128/82.   He does workout. He denies chest pain, shortness of breath, dizziness.   He is on cholesterol medication and denies myalgias. His cholesterol is at goal. The cholesterol last visit was:   Lab Results  Component Value Date   CHOL 139 05/24/2016   HDL 46 05/24/2016   LDLCALC 79 05/24/2016   LDLDIRECT 127.3 10/01/2009   TRIG 68 05/24/2016   CHOLHDL 3.0 05/24/2016    He has been working on diet and exercise for diabetes due to LADA, patient was switch to 75/25 mix and has been doing very well, and denies foot ulcerations, hyperglycemia, hypoglycemia , increased appetite, nausea, paresthesia of the feet, polydipsia, polyuria, visual disturbances, vomiting and weight loss. Last A1C in the office was:  Lab Results  Component Value Date   HGBA1C 5.8 (H) 05/24/2016   Patient is on Vitamin D supplement.  Lab Results  Component Value Date   VD25OH 41 05/24/2016    He is on thyroid medication. His medication was changed last visit but he did not change it, having constipation, feeling lethargic, still on 13mcg daily, feels better at 238mcg.    Lab Results  Component Value Date   TSH 14.95 (H) 05/24/2016  .  BMI is Body mass index is 26.89 kg/m., he is working on diet and exercise. Wt Readings from Last 3 Encounters:  10/13/16 226 lb 12.8 oz (102.9 kg)  05/24/16 224 lb 6.4 oz (101.8 kg)  01/06/16 219 lb (99.3 kg)   He has a history of testosterone deficiency and is on testosterone replacement. He states that the testosterone helps with his energy, libido, muscle mass. Lab Results  Component Value Date   TESTOSTERONE 440 11/12/2015     Current Medications:  Current Outpatient Prescriptions on File Prior to Visit  Medication Sig Dispense Refill  . atorvastatin (LIPITOR) 80 MG tablet Take 1 tablet (80 mg total) by mouth at bedtime. (Patient taking differently: Take 40 mg by mouth at bedtime. ) 90 tablet 3  . BD PEN NEEDLE NANO U/F 32G X 4 MM MISC USE TWICE A DAY AS DIRECTED  11  . Cholecalciferol (VITAMIN D PO) Take 4,000 Int'l Units by mouth daily.    Marland Kitchen glucose blood (FREESTYLE INSULINX TEST) test strip Check blood sugar 3 times daily-DX-E10.65 100 each 12  . glucose blood test strip     . ibuprofen (ADVIL,MOTRIN) 200 MG tablet Take 400 mg by mouth every 6 (six) hours as needed for headache or  mild pain.    . Insulin Lispro Prot & Lispro (HUMALOG 75/25 MIX) (75-25) 100 UNIT/ML Kwikpen Inject 20 units subcutaneously with first meal of the day.  Inject  subcutaneously 10 units with your largest meal of the day prior to bed. (Patient taking differently: Inject 12-16 Units into the skin 2 (two) times daily with a meal. 12units at 6am, 16units at 6pm.) 27 mL 4  . levothyroxine (SYNTHROID, LEVOTHROID) 175 MCG tablet Take 1 tablet (175 mcg total) by mouth daily before breakfast. 90 tablet 3  . metFORMIN (GLUCOPHAGE-XR) 500 MG 24 hr tablet TAKE 1 TABLET  WITH  BREAKFAST AND LUNCH, AND 2  TABLETS WITH SUPPER FOR  DIABETES 360 tablet 1  . testosterone (TESTIM) 50 MG/5GM (1%) GEL Place 5 g onto the skin daily. 90 Tube 1   No current facility-administered medications on file prior to visit.     Medical History:  Past Medical History:  Diagnosis Date  . Anxiety   . Cancer ([place])    thyroid  . Complication of anesthesia 2001   panic attack when going under anesthesia  . Diabetes mellitus without complication ([place])    type 2  . Heart murmur    as a teen  . Hyperlipidemia   . Hypothyroidism   . Kidney stones   . Other testicular hypofunction   . PONV (postoperative nausea and vomiting)   . Sleep apnea     Allergies:  Allergies  Allergen Reactions  . Shellfish Allergy Anaphylaxis and Rash     Review of Systems:  Review of Systems  Constitutional: Positive for malaise/fatigue. Negative for chills and fever.  HENT: Negative for congestion, ear pain and sore throat.   Eyes: Negative.   Respiratory: Negative for cough, shortness of breath and wheezing.   Cardiovascular: Negative for chest pain, palpitations and leg swelling.  Gastrointestinal: Positive for constipation. Negative for blood in stool, diarrhea, heartburn and melena.  Genitourinary: Negative.   Skin: Positive for rash.  Neurological: Negative for dizziness, sensory change, loss of consciousness and headaches.  Psychiatric/Behavioral: Negative for depression. The patient is not nervous/anxious and does not have insomnia.     Family history- Review and unchanged  Social history- Review and unchanged  Physical Exam: BP 128/82   Pulse 82   Temp 97.7 F (36.5 C)   Resp 16   Ht 6\' 5"  (1.956 m)   Wt 226 lb 12.8 oz (102.9 kg)   SpO2 96%   BMI 26.89 kg/m  Wt Readings from Last 3 Encounters:  10/13/16 226 lb 12.8 oz (102.9 kg)  05/24/16 224 lb 6.4 oz (101.8 kg)  01/06/16 219 lb (99.3 kg)    General Appearance: Well nourished well developed, in no apparent  distress. Eyes: PERRLA, EOMs, conjunctiva no swelling or erythema ENT/Mouth: Ear canals normal without obstruction, swelling, erythma, discharge.  TMs normal bilaterally.  Oropharynx moist, clear, without exudate, or postoropharyngeal swelling. Neck: Supple, thyroid normal,no cervical adenopathy  Respiratory: Respiratory effort normal, Breath sounds clear A&P without rhonchi, wheeze, or rale.  No retractions, no accessory usage. Cardio: RRR with no MRGs. Brisk peripheral pulses without edema.  Abdomen: Soft, + BS,  Non tender, no guarding, rebound, hernias, masses. Musculoskeletal: Full ROM, 5/5 strength, Normal gait Skin: Warm, dry without rashes, lesions, ecchymosis.  Neuro: Awake and oriented X 3, Cranial nerves intact. Normal muscle tone, no cerebellar symptoms. Psych: Normal affect, Insight and Judgment appropriate.    Vicie Mutters, PA-C 4:45 PM [facility]

## 2016-10-13 NOTE — Patient Instructions (Addendum)
increase to 1 tablet 3 x/week and 1&1/2 tab 4 x/week which would = 9 tabs/week   Thyroid level is not in range. Please make sure you are taking your thyroid medication 60 mins before food with just water. Do not take it within 4 hours of calcium, iron, magnesium, stomach medications like tums, zantac, prilosec for example.   Hypothyroidism can cause slow metabolism, fatigue, weight gain, dry hair/skin, constipation, swelling, hoarseness, decreased memory/brain fog.    Hypoglycemia Hypoglycemia occurs when the level of sugar (glucose) in the blood is too low. Glucose is a type of sugar that provides the body's main source of energy. Certain hormones (insulin and glucagon) control the level of glucose in the blood. Insulin lowers blood glucose, and glucagon increases blood glucose. Hypoglycemia can result from having too much insulin in the bloodstream, or from not eating enough food that contains glucose. Hypoglycemia can happen in people who do or do not have diabetes. It can develop quickly, and it can be a medical emergency. What are the causes? Hypoglycemia occurs most often in people who have diabetes. If you have diabetes, hypoglycemia may be caused by:  Diabetes medicine.  Not eating enough, or not eating often enough.  Increased physical activity.  Drinking alcohol, especially when you have not eaten recently. If you do not have diabetes, hypoglycemia may be caused by:  A tumor in the pancreas. The pancreas is the organ that makes insulin.  Not eating enough, or not eating for long periods at a time (fasting).  Severe infection or illness that affects the liver, heart, or kidneys.  Certain medicines. You may also have reactive hypoglycemia. This condition causes hypoglycemia within 4 hours of eating a meal. This may occur after having stomach surgery. Sometimes, the cause of reactive hypoglycemia is not known. What increases the risk? Hypoglycemia is more likely to develop  in:  People who have diabetes and take medicines to lower blood glucose.  People who abuse alcohol.  People who have a severe illness. What are the signs or symptoms? Hypoglycemia may not cause any symptoms. If you have symptoms, they may include:  Hunger.  Anxiety.  Sweating and feeling clammy.  Confusion.  Dizziness or feeling light-headed.  Sleepiness.  Nausea.  Increased heart rate.  Headache.  Blurry vision.  Seizure.  Nightmares.  Tingling or numbness around the mouth, lips, or tongue.  A change in speech.  Decreased ability to concentrate.  A change in coordination.  Restless sleep.  Tremors or shakes.  Fainting.  Irritability. How is this diagnosed? Hypoglycemia is diagnosed with a blood test to measure your blood glucose level. This blood test is done while you are having symptoms. Your health care provider may also do a physical exam and review your medical history. If you do not have diabetes, other tests may be done to find the cause of your hypoglycemia. How is this treated? This condition can often be treated by immediately eating or drinking something that contains glucose, such as:  3-4 sugar tablets (glucose pills).  Glucose gel, 15-gram tube.  Fruit juice, 4 oz (120 mL).  Regular soda (not diet soda), 4 oz (120 mL).  Low-fat milk, 4 oz (120 mL).  Several pieces of hard candy.  Sugar or honey, 1 Tbsp. Treating Hypoglycemia If You Have Diabetes  If you are alert and able to swallow safely, follow the 15:15 rule:  Take 15 grams of a rapid-acting carbohydrate. Rapid-acting options include:  1 tube of glucose gel.  3  glucose pills.  6-8 pieces of hard candy.  4 oz (120 mL) of fruit juice.  4 oz (120 ml) of regular (not diet) soda.  Check your blood glucose 15 minutes after you take the carbohydrate.  If the repeat blood glucose level is still at or below 70 mg/dL (3.9 mmol/L), take 15 grams of a carbohydrate  again.  If your blood glucose level does not increase above 70 mg/dL (3.9 mmol/L) after 3 tries, seek emergency medical care.  After your blood glucose level returns to normal, eat a meal or a snack within 1 hour. Treating Severe Hypoglycemia  Severe hypoglycemia is when your blood glucose level is at or below 54 mg/dL (3 mmol/L). Severe hypoglycemia is an emergency. Do not wait to see if the symptoms will go away. Get medical help right away. Call your local emergency services (911 in the [place] Do not drive yourself to the hospital. If you have severe hypoglycemia and you cannot eat or drink, you may need an injection of glucagon. A family member or close friend should learn how to check your blood glucose and how to give you a glucagon injection. Ask your health care provider if you need to have an emergency glucagon injection kit available. Severe hypoglycemia may need to be treated in a hospital. The treatment may include getting glucose through an IV tube. You may also need treatment for the cause of your hypoglycemia. Follow these instructions at home: General instructions  Avoid any diets that cause you to not eat enough food. Talk with your health care provider before you start any new diet.  Take over-the-counter and prescription medicines only as told by your health care provider.  Limit alcohol intake to no more than 1 drink per day for nonpregnant women and 2 drinks per day for men. One drink equals 12 oz of beer, 5 oz of wine, or 1 oz of hard liquor.  Keep all follow-up visits as told by your health care provider. This is important. If You Have Diabetes:   Make sure you know the symptoms of hypoglycemia.  Always have a rapid-acting carbohydrate snack with you to treat low blood sugar.  Follow your diabetes management plan, as told by your health care provider. Make sure you:  Take your medicines as directed.  Follow your exercise plan.  Follow your meal plan. Eat on  time, and do not skip meals.  Check your blood glucose as often as directed. Make sure to check your blood glucose before and after exercise. If you exercise longer or in a different way than usual, check your blood glucose more often.  Follow your sick day plan whenever you cannot eat or drink normally. Make this plan in advance with your health care provider.  Share your diabetes management plan with people in your workplace, school, and household.  Check your urine for ketones when you are ill and as told by your health care provider.  Carry a medical alert card or wear medical alert jewelry. If You Have Reactive Hypoglycemia or Low Blood Sugar From Other Causes:  Monitor your blood glucose as told by your health care provider.  Follow instructions from your health care provider about eating or drinking restrictions. Contact a health care provider if:  You have problems keeping your blood glucose in your target range.  You have frequent episodes of hypoglycemia. Get help right away if:  You continue to have hypoglycemia symptoms after eating or drinking something containing glucose.  Your blood  glucose is at or below 54 mg/dL (3 mmol/L).  You have a seizure.  You faint. These symptoms may represent a serious problem that is an emergency. Do not wait to see if the symptoms will go away. Get medical help right away. Call your local emergency services (911 in the [place] Do not drive yourself to the hospital.  This information is not intended to replace advice given to you by your health care provider. Make sure you discuss any questions you have with your health care provider. Document Released: 08/16/2005 Document Revised: 01/28/2016 Document Reviewed: 09/19/2015 Elsevier Interactive Patient Education  2017 [employer].

## 2016-10-14 ENCOUNTER — Other Ambulatory Visit: Payer: Self-pay | Admitting: Physician Assistant

## 2016-10-14 DIAGNOSIS — E039 Hypothyroidism, unspecified: Secondary | ICD-10-CM

## 2016-10-14 LAB — HEPATIC FUNCTION PANEL
ALBUMIN: 4.3 g/dL (ref 3.6–5.1)
ALK PHOS: 54 U/L (ref 40–115)
ALT: 18 U/L (ref 9–46)
AST: 18 U/L (ref 10–35)
BILIRUBIN DIRECT: 0.1 mg/dL (ref <=0.2)
BILIRUBIN TOTAL: 0.5 mg/dL (ref 0.2–1.2)
Indirect Bilirubin: 0.4 mg/dL (ref 0.2–1.2)
Total Protein: 6.6 g/dL (ref 6.1–8.1)

## 2016-10-14 LAB — HEMOGLOBIN A1C
HEMOGLOBIN A1C: 6.1 % — AB (ref <5.7)
Mean Plasma Glucose: 128 mg/dL

## 2016-10-14 LAB — BASIC METABOLIC PANEL WITH GFR
BUN: 23 mg/dL (ref 7–25)
CHLORIDE: 105 mmol/L (ref 98–110)
CO2: 23 mmol/L (ref 20–31)
Calcium: 9.4 mg/dL (ref 8.6–10.3)
Creat: 1.15 mg/dL (ref 0.70–1.33)
GFR, EST AFRICAN AMERICAN: 82 mL/min (ref >=60)
GFR, EST NON AFRICAN AMERICAN: 71 mL/min (ref >=60)
Glucose, Bld: 127 mg/dL — ABNORMAL HIGH (ref 65–99)
POTASSIUM: 4.3 mmol/L (ref 3.5–5.3)
SODIUM: 140 mmol/L (ref 135–146)

## 2016-10-14 LAB — LIPID PANEL
Cholesterol: 107 mg/dL (ref <200)
HDL: 40 mg/dL — AB (ref >40)
LDL CALC: 52 mg/dL (ref <100)
TRIGLYCERIDES: 76 mg/dL (ref <150)
Total CHOL/HDL Ratio: 2.7 Ratio (ref <5.0)
VLDL: 15 mg/dL (ref <30)

## 2016-10-14 LAB — MAGNESIUM: Magnesium: 1.8 mg/dL (ref 1.5–2.5)

## 2016-10-14 NOTE — Progress Notes (Signed)
LVM for pt to return office call for LAB results.

## 2016-11-18 ENCOUNTER — Other Ambulatory Visit: Payer: Self-pay

## 2016-11-21 DIAGNOSIS — G4733 Obstructive sleep apnea (adult) (pediatric): Secondary | ICD-10-CM | POA: Diagnosis not present

## 2016-12-15 ENCOUNTER — Encounter: Payer: Self-pay | Admitting: Internal Medicine

## 2017-01-13 ENCOUNTER — Other Ambulatory Visit: Payer: Self-pay | Admitting: Internal Medicine

## 2017-01-13 ENCOUNTER — Ambulatory Visit: Payer: Self-pay | Admitting: Internal Medicine

## 2017-01-13 DIAGNOSIS — E119 Type 2 diabetes mellitus without complications: Secondary | ICD-10-CM

## 2017-01-13 NOTE — Progress Notes (Signed)
NO SHOW

## 2017-01-14 ENCOUNTER — Other Ambulatory Visit: Payer: Self-pay | Admitting: Internal Medicine

## 2017-01-19 ENCOUNTER — Ambulatory Visit (INDEPENDENT_AMBULATORY_CARE_PROVIDER_SITE_OTHER): Payer: 59 | Admitting: Internal Medicine

## 2017-01-19 ENCOUNTER — Encounter: Payer: Self-pay | Admitting: Internal Medicine

## 2017-01-19 ENCOUNTER — Other Ambulatory Visit: Payer: Self-pay | Admitting: *Deleted

## 2017-01-19 VITALS — BP 112/72 | HR 60 | Temp 97.5°F | Resp 16 | Ht 77.5 in | Wt 220.0 lb

## 2017-01-19 DIAGNOSIS — Z9989 Dependence on other enabling machines and devices: Secondary | ICD-10-CM | POA: Diagnosis not present

## 2017-01-19 DIAGNOSIS — R030 Elevated blood-pressure reading, without diagnosis of hypertension: Secondary | ICD-10-CM | POA: Diagnosis not present

## 2017-01-19 DIAGNOSIS — E559 Vitamin D deficiency, unspecified: Secondary | ICD-10-CM

## 2017-01-19 DIAGNOSIS — E109 Type 1 diabetes mellitus without complications: Secondary | ICD-10-CM

## 2017-01-19 DIAGNOSIS — E782 Mixed hyperlipidemia: Secondary | ICD-10-CM

## 2017-01-19 DIAGNOSIS — E349 Endocrine disorder, unspecified: Secondary | ICD-10-CM | POA: Diagnosis not present

## 2017-01-19 DIAGNOSIS — Z79899 Other long term (current) drug therapy: Secondary | ICD-10-CM

## 2017-01-19 DIAGNOSIS — G4733 Obstructive sleep apnea (adult) (pediatric): Secondary | ICD-10-CM | POA: Diagnosis not present

## 2017-01-19 DIAGNOSIS — E139 Other specified diabetes mellitus without complications: Secondary | ICD-10-CM

## 2017-01-19 LAB — CBC WITH DIFFERENTIAL/PLATELET
BASOS ABS: 69 {cells}/uL (ref 0–200)
Basophils Relative: 1 %
EOS ABS: 69 {cells}/uL (ref 15–500)
Eosinophils Relative: 1 %
HEMATOCRIT: 37.3 % — AB (ref 38.5–50.0)
HEMOGLOBIN: 12.7 g/dL — AB (ref 13.2–17.1)
Lymphocytes Relative: 37 %
Lymphs Abs: 2553 cells/uL (ref 850–3900)
MCH: 31.1 pg (ref 27.0–33.0)
MCHC: 34 g/dL (ref 32.0–36.0)
MCV: 91.2 fL (ref 80.0–100.0)
MONO ABS: 828 {cells}/uL (ref 200–950)
MPV: 9.5 fL (ref 7.5–12.5)
Monocytes Relative: 12 %
NEUTROS PCT: 49 %
Neutro Abs: 3381 cells/uL (ref 1500–7800)
Platelets: 250 10*3/uL (ref 140–400)
RBC: 4.09 MIL/uL — ABNORMAL LOW (ref 4.20–5.80)
RDW: 13.2 % (ref 11.0–15.0)
WBC: 6.9 10*3/uL (ref 3.8–10.8)

## 2017-01-19 LAB — LIPID PANEL
CHOLESTEROL: 107 mg/dL (ref <200)
HDL: 32 mg/dL — ABNORMAL LOW (ref >40)
LDL CALC: 56 mg/dL (ref <100)
TRIGLYCERIDES: 93 mg/dL (ref <150)
Total CHOL/HDL Ratio: 3.3 Ratio (ref <5.0)
VLDL: 19 mg/dL (ref <30)

## 2017-01-19 LAB — BASIC METABOLIC PANEL WITH GFR
BUN: 30 mg/dL — ABNORMAL HIGH (ref 7–25)
CALCIUM: 9.3 mg/dL (ref 8.6–10.3)
CHLORIDE: 108 mmol/L (ref 98–110)
CO2: 25 mmol/L (ref 20–31)
CREATININE: 1.18 mg/dL (ref 0.70–1.33)
GFR, Est African American: 79 mL/min (ref >=60)
GFR, Est Non African American: 69 mL/min (ref >=60)
GLUCOSE: 85 mg/dL (ref 65–99)
Potassium: 4 mmol/L (ref 3.5–5.3)
Sodium: 142 mmol/L (ref 135–146)

## 2017-01-19 LAB — HEPATIC FUNCTION PANEL
ALBUMIN: 3.9 g/dL (ref 3.6–5.1)
ALT: 17 U/L (ref 9–46)
AST: 14 U/L (ref 10–35)
Alkaline Phosphatase: 53 U/L (ref 40–115)
Bilirubin, Direct: 0.1 mg/dL (ref <=0.2)
Indirect Bilirubin: 0.2 mg/dL (ref 0.2–1.2)
TOTAL PROTEIN: 6.2 g/dL (ref 6.1–8.1)
Total Bilirubin: 0.3 mg/dL (ref 0.2–1.2)

## 2017-01-19 MED ORDER — TESTOSTERONE 50 MG/5GM (1%) TD GEL: 5.0000 g | Freq: Every day | TRANSDERMAL | 1 refills | Status: DC

## 2017-01-19 MED ORDER — LEVOTHYROXINE SODIUM 300 MCG PO TABS: ORAL_TABLET | ORAL | 1 refills | Status: DC

## 2017-01-19 NOTE — Progress Notes (Signed)
This very nice 56 y.o. MWM presents for 6 month follow up with Hypertension, Hyperlipidemia, LADA and Vitamin D Deficiency. Patient has OSA on CPAP with improved restorative sleep  and less daytime sleepiness.      Patient was treated for labile HTN in the past (2013)  with an ACEi/diuretic, but some time apparently has d/c'd this medication.  BP has been controlled at home. Today's BP is at goal - 112/72. Patient has had no complaints of any cardiac type chest pain, palpitations, dyspnea/orthopnea/PND, dizziness, claudication, or dependent edema.     Hyperlipidemia is controlled with diet & meds. Patient denies myalgias or other med SE's. 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  Last A1c was  Lab Results  Component Value Date   HGBA1C 6.1 (H) 10/13/2016      Patient has Testosterone Deficiency and is on replacement by gel, but has been off treatment for approx a month.  Patient underwent Thyroidectomy in 2001 for Thyroid Cancer and is on supressive and replacement therapy. Further, the patient also has history of Vitamin D Deficiency ("11" in 2011) and supplements vitamin D sporadically without any suspected side-effects. Last vitamin D was still low: Lab Results  Component Value Date   VD25OH 41 05/24/2016   Current Outpatient Prescriptions on File Prior to Visit  Medication Sig  . allopurinol (ZYLOPRIM) 100 MG tablet Take 100 mg by mouth daily.  Marland Kitchen atorvastatin (LIPITOR) 80 MG tablet Take 1 tablet (80 mg total) by mouth at bedtime. (Patient taking differently: Take 40 mg by mouth at bedtime. )  . BD PEN NEEDLE NANO U/F  32G X 4 MM MISC USE TWICE A DAY AS DIRECTED  . Cholecalciferol (VITAMIN D PO) Take 4,000 Int'l Units by mouth daily.  Marland Kitchen CINNAMON PO Take 1,000 mg by mouth.  Marland Kitchen glucose blood (FREESTYLE INSULINX TEST) test strip Check blood sugar 3 times daily-[id number]  . glucose blood test strip   . HUMALOG MIX 75/25 KWIKPEN (75-25) 100 UNIT/ML Kwikpen INJECT SUBCUTANEOUSLY 20  UNITS WITH FIRST MEAL OF  THE DAY AND 10 UNITS WITH  LARGEST MEAL OF THE DAY  PRIOR TO BED  . ibuprofen (ADVIL,MOTRIN) 200 MG tablet Take 400 mg by mouth every 6 (six) hours as needed for headache or mild pain.  Marland Kitchen lisinopril-hydrochlorothiazide (PRINZIDE,ZESTORETIC) 10-12.5 MG tablet Take 1 tablet by mouth daily.  . metFORMIN (GLUCOPHAGE-XR) 500 MG 24 hr tablet TAKE 1 TABLET WITH  BREAKFAST AND LUNCH, AND 2  TABLETS WITH SUPPER FOR  DIABETES  . testosterone (TESTIM) 50 MG/5GM (1%) GEL Place 5 g onto the skin daily.   No current facility-administered medications on file prior to visit.    Allergies  Allergen Reactions  . Shellfish Allergy Anaphylaxis and Rash   PMHx:   Past Medical History:  Diagnosis Date  . Anxiety   . Cancer ([place])    thyroid  . Complication of anesthesia 2001   panic attack when going under anesthesia  . Diabetes mellitus without complication ([place])    type 2  . Heart murmur    as a teen  .  Hyperlipidemia   . Hypothyroidism   . Kidney stones   . Other testicular hypofunction   . PONV (postoperative nausea and vomiting)   . Sleep apnea    Immunization History  Administered Date(s) Administered  . Influenza Split 06/12/2013  . Influenza Whole 06/30/2009  . PPD Test 10/29/2013, 10/30/2014, 11/12/2015  . Td 01/08/2004  . Tdap 10/29/2013   Past Surgical History:  Procedure Laterality Date  . CYSTOSCOPY WITH HOLMIUM LASER LITHOTRIPSY Right 01/19/2016   Procedure: CYSTOSCOPY WITH HOLMIUM LASER LITHOTRIPSY;  Surgeon: Carolan Clines, MD;  Location: [facility];  Service: Urology;  Laterality: Right;  .  CYSTOSCOPY/RETROGRADE/URETEROSCOPY/STONE EXTRACTION WITH BASKET Right 01/19/2016   Procedure: CYSTO/RIGHT RETROGRADE PYELOGRAM/URETEROSCOPY/STONE EXTRACTION WITH BASKET AND STENT PLACEMENT;  Surgeon: Carolan Clines, MD;  Location: [facility];  Service: Urology;  Laterality: Right;  . THYROIDECTOMY  2001   FHx:    Reviewed / unchanged  SHx:    Reviewed / unchanged  Systems Review:  Constitutional: Denies fever, chills, wt changes, headaches, insomnia, fatigue, night sweats, change in appetite. Eyes: Denies redness, blurred vision, diplopia, discharge, itchy, watery eyes.  ENT: Denies discharge, congestion, post nasal drip, epistaxis, sore throat, earache, hearing loss, dental pain, tinnitus, vertigo, sinus pain, snoring.  CV: Denies chest pain, palpitations, irregular heartbeat, syncope, dyspnea, diaphoresis, orthopnea, PND, claudication or edema. Respiratory: denies cough, dyspnea, DOE, pleurisy, hoarseness, laryngitis, wheezing.  Gastrointestinal: Denies dysphagia, odynophagia, heartburn, reflux, water brash, abdominal pain or cramps, nausea, vomiting, bloating, diarrhea, constipation, hematemesis, melena, hematochezia  or hemorrhoids. Genitourinary: Denies dysuria, frequency, urgency, nocturia, hesitancy, discharge, hematuria or flank pain. Musculoskeletal: Denies arthralgias, myalgias, stiffness, jt. swelling, pain, limping or strain/sprain.  Skin: Denies pruritus, rash, hives, warts, acne, eczema or change in skin lesion(s). Neuro: No weakness, tremor, incoordination, spasms, paresthesia or pain. Psychiatric: Denies confusion, memory loss or sensory loss. Endo: Denies change in weight, skin or hair change.  Heme/Lymph: No excessive bleeding, bruising or enlarged lymph nodes.  Physical Exam  BP 112/72   Pulse 60   Temp 97.5 F (36.4 C)   Resp 16   Ht 6' 5.5" (1.969 m)   Wt 220 lb (99.8 kg)   BMI 25.75 kg/m   Appears well nourished, well groomed  and in no distress.  Eyes:  PERRLA, EOMs, conjunctiva no swelling or erythema. Sinuses: No frontal/maxillary tenderness ENT/Mouth: EAC's clear, TM's nl w/o erythema, bulging. Nares clear w/o erythema, swelling, exudates. Oropharynx clear without erythema or exudates. Oral hygiene is good. Tongue normal, non obstructing. Hearing intact.  Neck: Supple. Thyroid nl. Car 2+/2+ without bruits, nodes or JVD. Chest: Respirations nl with BS clear & equal w/o rales, rhonchi, wheezing or stridor.  Cor: Heart sounds normal w/ regular rate and rhythm without sig. murmurs, gallops, clicks or rubs. Peripheral pulses normal and equal  without edema.  Abdomen: Soft & bowel sounds normal. Non-tender w/o guarding, rebound, hernias, masses or organomegaly.  Lymphatics: Unremarkable.  Musculoskeletal: Full ROM all peripheral extremities, joint stability, 5/5 strength and normal gait.  Skin: Warm, dry without exposed rashes, lesions or ecchymosis apparent.  Neuro: Cranial nerves intact, reflexes equal bilaterally. Sensory-motor testing grossly intact. Tendon reflexes grossly intact.  Pysch: Alert & oriented x 3.  Insight and judgement nl & appropriate. No ideations.  Assessment and Plan:  1. Elevated blood pressure reading without diagnosis of hypertension  - advised patient that taking a n ACEi for renal protection is advised, but he prefers to defer taking any more pills now.  - Continue expectant monitoring & monitor  blood pressure at home.   - Continue DASH diet. Reminder to go to the ER if any CP,  SOB, nausea, dizziness, severe HA, changes vision/speech,  left arm numbness and tingling and jaw pain.  - CBC with Differential/Platelet - BASIC METABOLIC PANEL WITH GFR - Magnesium - TSH  2. Hyperlipidemia, mixed  - Continue diet/meds, exercise,& lifestyle modifications.  - Continue monitor periodic cholesterol/liver & renal functions   - Hepatic function panel - Lipid panel - TSH  3. LADA (latent autoimmune diabetes in  adults), managed as type 1 ([place])  - Continue diet, exercise, lifestyle modifications.  - Monitor appropriate labs.  - Hemoglobin A1c  4. Vitamin D deficiency  - Continue supplementation.  - VITAMIN D 25 Hydroxy  5. Testosterone deficiency  - Testosterone  6. OSA on CPAP   7. Medication management  - CBC with Differential/Platelet - BASIC METABOLIC PANEL WITH GFR - Hepatic function panel - Magnesium - Lipid panel - TSH - Hemoglobin A1c - VITAMIN D 25 Hydroxy  - Testosterone       Discussed  regular exercise, BP monitoring, weight control to achieve/maintain BMI less than 25 and discussed med and SE's. Recommended labs to assess and monitor clinical status with further disposition pending results of labs. Over 30 minutes of exam, counseling, chart review was performed.

## 2017-01-19 NOTE — Patient Instructions (Signed)

## 2017-01-20 ENCOUNTER — Other Ambulatory Visit: Payer: Self-pay | Admitting: Internal Medicine

## 2017-01-20 DIAGNOSIS — E139 Other specified diabetes mellitus without complications: Secondary | ICD-10-CM

## 2017-01-20 LAB — TESTOSTERONE: Testosterone: 525 ng/dL (ref 250–827)

## 2017-01-20 LAB — TSH: TSH: 0.01 mIU/L — ABNORMAL LOW (ref 0.40–4.50)

## 2017-01-20 LAB — HEMOGLOBIN A1C
HEMOGLOBIN A1C: 6.2 % — AB (ref <5.7)
MEAN PLASMA GLUCOSE: 131 mg/dL

## 2017-01-20 LAB — MAGNESIUM: Magnesium: 1.8 mg/dL (ref 1.5–2.5)

## 2017-01-20 LAB — VITAMIN D 25 HYDROXY (VIT D DEFICIENCY, FRACTURES): VIT D 25 HYDROXY: 41 ng/mL (ref 30–100)

## 2017-01-20 MED ORDER — LISINOPRIL 20 MG PO TABS: ORAL_TABLET | ORAL | 1 refills | Status: DC

## 2017-03-31 DIAGNOSIS — E1065 Type 1 diabetes mellitus with hyperglycemia: Secondary | ICD-10-CM | POA: Diagnosis not present

## 2017-03-31 DIAGNOSIS — E109 Type 1 diabetes mellitus without complications: Secondary | ICD-10-CM | POA: Diagnosis not present

## 2017-03-31 DIAGNOSIS — Z794 Long term (current) use of insulin: Secondary | ICD-10-CM | POA: Diagnosis not present

## 2017-04-11 ENCOUNTER — Other Ambulatory Visit: Payer: Self-pay | Admitting: *Deleted

## 2017-04-11 MED ORDER — ATORVASTATIN CALCIUM 80 MG PO TABS: 80.0000 mg | ORAL_TABLET | Freq: Every day | ORAL | 3 refills | Status: DC

## 2017-04-11 MED ORDER — LEVOTHYROXINE SODIUM 300 MCG PO TABS: ORAL_TABLET | ORAL | 1 refills | Status: DC

## 2017-04-26 ENCOUNTER — Ambulatory Visit: Payer: Self-pay | Admitting: Internal Medicine

## 2017-04-26 DIAGNOSIS — Z0000 Encounter for general adult medical examination without abnormal findings: Secondary | ICD-10-CM

## 2017-04-26 NOTE — Progress Notes (Signed)
NO SHOW

## 2017-06-25 DIAGNOSIS — Z23 Encounter for immunization: Secondary | ICD-10-CM | POA: Diagnosis not present

## 2017-07-26 ENCOUNTER — Other Ambulatory Visit: Payer: Self-pay | Admitting: Internal Medicine

## 2017-08-10 DIAGNOSIS — H524 Presbyopia: Secondary | ICD-10-CM | POA: Diagnosis not present

## 2017-08-10 DIAGNOSIS — H5203 Hypermetropia, bilateral: Secondary | ICD-10-CM | POA: Diagnosis not present

## 2017-09-21 DIAGNOSIS — E109 Type 1 diabetes mellitus without complications: Secondary | ICD-10-CM | POA: Diagnosis not present

## 2017-09-21 DIAGNOSIS — I10 Essential (primary) hypertension: Secondary | ICD-10-CM | POA: Diagnosis not present

## 2017-09-21 DIAGNOSIS — Z0000 Encounter for general adult medical examination without abnormal findings: Secondary | ICD-10-CM | POA: Diagnosis not present

## 2017-09-23 DIAGNOSIS — Z0000 Encounter for general adult medical examination without abnormal findings: Secondary | ICD-10-CM | POA: Diagnosis not present

## 2017-09-23 DIAGNOSIS — R972 Elevated prostate specific antigen [PSA]: Secondary | ICD-10-CM | POA: Diagnosis not present

## 2018-01-31 DIAGNOSIS — E109 Type 1 diabetes mellitus without complications: Secondary | ICD-10-CM | POA: Diagnosis not present

## 2018-01-31 DIAGNOSIS — Z794 Long term (current) use of insulin: Secondary | ICD-10-CM | POA: Diagnosis not present

## 2018-01-31 DIAGNOSIS — E1065 Type 1 diabetes mellitus with hyperglycemia: Secondary | ICD-10-CM | POA: Diagnosis not present

## 2018-04-21 DIAGNOSIS — E7800 Pure hypercholesterolemia, unspecified: Secondary | ICD-10-CM | POA: Diagnosis not present

## 2018-04-21 DIAGNOSIS — E139 Other specified diabetes mellitus without complications: Secondary | ICD-10-CM | POA: Diagnosis not present

## 2018-04-21 DIAGNOSIS — I10 Essential (primary) hypertension: Secondary | ICD-10-CM | POA: Diagnosis not present

## 2018-04-28 DIAGNOSIS — E7800 Pure hypercholesterolemia, unspecified: Secondary | ICD-10-CM | POA: Diagnosis not present

## 2018-04-28 DIAGNOSIS — E139 Other specified diabetes mellitus without complications: Secondary | ICD-10-CM | POA: Diagnosis not present

## 2018-05-23 ENCOUNTER — Encounter: Payer: Self-pay | Admitting: Internal Medicine

## 2018-11-03 DIAGNOSIS — I10 Essential (primary) hypertension: Secondary | ICD-10-CM | POA: Diagnosis not present

## 2018-11-03 DIAGNOSIS — Z0000 Encounter for general adult medical examination without abnormal findings: Secondary | ICD-10-CM | POA: Diagnosis not present

## 2018-11-03 DIAGNOSIS — E139 Other specified diabetes mellitus without complications: Secondary | ICD-10-CM | POA: Diagnosis not present

## 2019-05-29 NOTE — Progress Notes (Signed)
History of Present Illness:      This very nice 58 y.o. MWM lost to f/u for 2 years returns to re-establish care  presents for 3 month follow up with HTN, HLD,  LADA . Hypothyroidism, Testosterone deficiency and Vitamin D Deficiency.       Patient is followed  for HTN since 2013 & BP has been controlled at home. Today's BP is at goal - 138/86. Patient has had no complaints of any cardiac type chest pain, palpitations, dyspnea / orthopnea / PND, dizziness, claudication, or dependent edema.  Hyperlipidemia is controlled with diet & meds. Patient denies myalgias or other med SE's. 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  Last A1c was not at goal:  Lab Results  Component Value Date   HGBA1C 6.2 (H) 01/19/2017       Patient underwent Thyroidectomy in 2001 for Thyroid Cancer and is on supressive and replacement therapy.       Further, the patient also has history of Vitamin D Deficiency ("11" / 2011)  and supplements vitamin D without any suspected side-effects. Last vitamin D was not at goal:  Lab Results  Component Value Date   VD25OH 41 01/19/2017    Current Outpatient Medications on File Prior to Visit  Medication Sig  . BD PEN NEEDLE NANO U/F 32G X 4 MM MISC USE TWICE A DAY AS DIRECTED  . Cholecalciferol (VITAMIN D PO) Take 4,000 Int'l Units by mouth daily.  Marland Kitchen CINNAMON PO Take 1,000 mg by mouth.  Marland Kitchen glucose blood (FREESTYLE INSULINX TEST) test strip Check blood sugar 3 times daily-[id number]  . glucose blood test strip   . HUMALOG MIX 75/25 KWIKPEN (75-25) 100 UNIT/ML Kwikpen INJECT SUBCUTANEOUSLY 20  UNITS WITH FIRST MEAL OF  THE  DAY AND 10 UNITS WITH  LARGEST MEAL OF THE DAY  PRIOR TO BED  . ibuprofen (ADVIL,MOTRIN) 200 MG tablet Take 400 mg by mouth every 6 (six) hours as needed for headache or mild pain.  Marland Kitchen levothyroxine (SYNTHROID) 300 MCG tablet Take 1 tablet daily on an empty stomach with only water for 30 minutes and no antacids for 4 hours  . metFORMIN (GLUCOPHAGE-XR) 500 MG 24 hr tablet TAKE 1 TABLET WITH  BREAKFAST AND LUNCH, AND 2  TABLETS WITH SUPPER FOR  DIABETES  . allopurinol (ZYLOPRIM) 100 MG tablet Take 100 mg by mouth daily.  Marland Kitchen atorvastatin (LIPITOR) 80 MG tablet Take 1 tablet (80 mg total) by mouth at bedtime. (Patient not taking: Reported on 05/30/2019)  . lisinopril (PRINIVIL,ZESTRIL) 20 MG tablet Take 1 tablet daily for Diabetic Kidney protection  . testosterone (TESTIM) 50 MG/5GM (1%) GEL Place 5 g onto the skin daily. (Patient not taking: Reported on 05/30/2019)   No current facility-administered medications on file prior to visit.     Allergies  Allergen Reactions  . Shellfish Allergy Anaphylaxis and Rash    PMHx:   Past Medical History:  Diagnosis Date  . Anxiety   . Cancer ([place])    thyroid  . Complication of anesthesia 2001   panic attack when going  under anesthesia  . Diabetes mellitus without complication ([place])    type 2  . Heart murmur    as a teen  . Hyperlipidemia   . Hypothyroidism   . Kidney stones   . Other testicular hypofunction   . PONV (postoperative nausea and vomiting)   . Sleep apnea    Immunization History  Administered Date(s) Administered  . Influenza Split 06/12/2013  . Influenza Whole 06/30/2009  . Influenza-Unspecified 06/25/2017  . PPD Test 10/29/2013, 10/30/2014, 11/12/2015  . Td 01/08/2004  . Tdap 10/29/2013   Past Surgical History:  Procedure Laterality Date  . CYSTOSCOPY WITH HOLMIUM LASER LITHOTRIPSY Right 01/19/2016   Procedure: CYSTOSCOPY WITH HOLMIUM LASER LITHOTRIPSY;  Surgeon: Carolan Clines, MD;  Location: [facility];  Service: Urology;   Laterality: Right;  . CYSTOSCOPY/RETROGRADE/URETEROSCOPY/STONE EXTRACTION WITH BASKET Right 01/19/2016   Procedure: CYSTO/RIGHT RETROGRADE PYELOGRAM/URETEROSCOPY/STONE EXTRACTION WITH BASKET AND STENT PLACEMENT;  Surgeon: Carolan Clines, MD;  Location: [facility];  Service: Urology;  Laterality: Right;  . THYROIDECTOMY  2001    FHx:    Reviewed / unchanged  SHx:    Reviewed / unchanged   Systems Review:  Constitutional: Denies fever, chills, wt changes, headaches, insomnia, fatigue, night sweats, change in appetite. Eyes: Denies redness, blurred vision, diplopia, discharge, itchy, watery eyes.  ENT: Denies discharge, congestion, post nasal drip, epistaxis, sore throat, earache, hearing loss, dental pain, tinnitus, vertigo, sinus pain, snoring.  CV: Denies chest pain, palpitations, irregular heartbeat, syncope, dyspnea, diaphoresis, orthopnea, PND, claudication or edema. Respiratory: denies cough, dyspnea, DOE, pleurisy, hoarseness, laryngitis, wheezing.  Gastrointestinal: Denies dysphagia, odynophagia, heartburn, reflux, water brash, abdominal pain or cramps, nausea, vomiting, bloating, diarrhea, constipation, hematemesis, melena, hematochezia  or hemorrhoids. Genitourinary: Denies dysuria, frequency, urgency, nocturia, hesitancy, discharge, hematuria or flank pain. Musculoskeletal: Denies arthralgias, myalgias, stiffness, jt. swelling, pain, limping or strain/sprain.  Skin: Denies pruritus, rash, hives, warts, acne, eczema or change in skin lesion(s). Neuro: No weakness, tremor, incoordination, spasms, paresthesia or pain. Psychiatric: Denies confusion, memory loss or sensory loss. Endo: Denies change in weight, skin or hair change.  Heme/Lymph: No excessive bleeding, bruising or enlarged lymph nodes.  Physical Exam  BP 138/86   Pulse 76   Temp 97.9 F (36.6 C)   Ht 6\' 5"  (1.956 m)   Wt 225 lb 12.8 oz (102.4 kg)   SpO2 97%   BMI 26.78 kg/m   Appears  well nourished, well groomed   and in no distress.  Eyes: PERRLA, EOMs, conjunctiva no swelling or erythema. Sinuses: No frontal/maxillary tenderness ENT/Mouth: EAC's clear, TM's nl w/o erythema, bulging. Nares clear w/o erythema, swelling, exudates. Oropharynx clear without erythema or exudates. Oral hygiene is good. Tongue normal, non obstructing. Hearing intact.  Neck: Supple. Thyroid not palpable. Car 2+/2+ without bruits, nodes or JVD. Chest: Respirations nl with BS clear & equal w/o rales, rhonchi, wheezing or stridor.  Cor: Heart sounds normal w/ regular rate and rhythm without sig. murmurs, gallops, clicks or rubs. Peripheral pulses normal and equal  without edema.  Abdomen: Soft & bowel sounds normal. Non-tender w/o guarding, rebound, hernias, masses or organomegaly.  Lymphatics: Unremarkable.  Musculoskeletal: Full ROM all peripheral extremities, joint stability, 5/5 strength and normal gait.  Skin: Warm, dry without exposed rashes, lesions or ecchymosis apparent.  Neuro: Cranial nerves intact, reflexes equal bilaterally. Sensory-motor testing grossly intact. Tendon reflexes grossly intact.  Pysch: Alert & oriented x 3.  Insight and judgement nl & appropriate. No ideations.  Assessment and Plan:  1. Essential hypertension  -  Continue medication, monitor blood pressure at home.  - Continue DASH diet.  Reminder to go to the ER if any CP,  SOB, nausea, dizziness, severe HA, changes vision/speech.  - CBC with Differential/Platelet - COMPLETE METABOLIC PANEL WITH GFR - Magnesium - TSH  2. Hype - Continue diet/meds, exercise,& lifestyle modifications.  - Continue monitor periodic cholesterol/liver & renal functions  rlipidemia associated with type 2 diabetes mellitus (HCC)  - Lipid panel - TSH  3. LADA (latent autoimmune diabetes in adults), managed as type 1 ([place])  - Continue diet, exercise  - Lifestyle modifications.  - Monitor appropriate labs.  - Hemoglobin A1c  4. Vitamin D deficiency  -  Continue supplementation.  - VITAMIN D 25 Hydroxyl  5. Hypothyroidism  - TSH  6. Testosterone deficiency  7. Medication management  - CBC with Differential/Platelet - COMPLETE METABOLIC PANEL WITH GFR - Magnesium - Lipid panel - TSH - Hemoglobin A1c - VITAMIN D 25 Hydroxy (Vit-D Deficiency, Fractures)         Discussed  regular exercise, BP monitoring, weight control to achieve/maintain BMI less than 25 and discussed med and SE's. Recommended labs to assess and monitor clinical status with further disposition pending results of labs.  I discussed the assessment and treatment plan with the patient. The patient was provided an opportunity to ask questions and all were answered. The patient agreed with the plan and demonstrated an understanding of the instructions.  I provided over 30 minutes of exam, counseling, chart review and  complex critical decision making.  Kirtland Bouchard, MD

## 2019-05-30 ENCOUNTER — Encounter: Payer: Self-pay | Admitting: Internal Medicine

## 2019-05-30 ENCOUNTER — Ambulatory Visit: Payer: 59 | Admitting: Internal Medicine

## 2019-05-30 ENCOUNTER — Other Ambulatory Visit: Payer: Self-pay

## 2019-05-30 VITALS — BP 138/86 | HR 76 | Temp 97.9°F | Ht 77.0 in | Wt 225.8 lb

## 2019-05-30 DIAGNOSIS — E1169 Type 2 diabetes mellitus with other specified complication: Secondary | ICD-10-CM

## 2019-05-30 DIAGNOSIS — E139 Other specified diabetes mellitus without complications: Secondary | ICD-10-CM

## 2019-05-30 DIAGNOSIS — I10 Essential (primary) hypertension: Secondary | ICD-10-CM

## 2019-05-30 DIAGNOSIS — E559 Vitamin D deficiency, unspecified: Secondary | ICD-10-CM

## 2019-05-30 DIAGNOSIS — E039 Hypothyroidism, unspecified: Secondary | ICD-10-CM

## 2019-05-30 DIAGNOSIS — E349 Endocrine disorder, unspecified: Secondary | ICD-10-CM

## 2019-05-30 DIAGNOSIS — E785 Hyperlipidemia, unspecified: Secondary | ICD-10-CM

## 2019-05-30 DIAGNOSIS — Z79899 Other long term (current) drug therapy: Secondary | ICD-10-CM

## 2019-05-30 NOTE — Patient Instructions (Signed)

## 2019-05-31 ENCOUNTER — Other Ambulatory Visit: Payer: Self-pay | Admitting: Internal Medicine

## 2019-05-31 DIAGNOSIS — E782 Mixed hyperlipidemia: Secondary | ICD-10-CM

## 2019-05-31 LAB — COMPLETE METABOLIC PANEL WITH GFR
AG Ratio: 1.8 (calc) (ref 1.0–2.5)
ALT: 19 U/L (ref 9–46)
AST: 16 U/L (ref 10–35)
Albumin: 4.6 g/dL (ref 3.6–5.1)
Alkaline phosphatase (APISO): 65 U/L (ref 35–144)
BUN: 22 mg/dL (ref 7–25)
CO2: 30 mmol/L (ref 20–32)
Calcium: 9.9 mg/dL (ref 8.6–10.3)
Chloride: 104 mmol/L (ref 98–110)
Creat: 1.08 mg/dL (ref 0.70–1.33)
GFR, Est African American: 87 mL/min/{1.73_m2} (ref >=60)
GFR, Est Non African American: 75 mL/min/{1.73_m2} (ref >=60)
Globulin: 2.6 g/dL (calc) (ref 1.9–3.7)
Glucose, Bld: 72 mg/dL (ref 65–99)
Potassium: 4.6 mmol/L (ref 3.5–5.3)
Sodium: 139 mmol/L (ref 135–146)
Total Bilirubin: 0.4 mg/dL (ref 0.2–1.2)
Total Protein: 7.2 g/dL (ref 6.1–8.1)

## 2019-05-31 LAB — CBC WITH DIFFERENTIAL/PLATELET
Absolute Monocytes: 624 cells/uL (ref 200–950)
Basophils Absolute: 72 cells/uL (ref 0–200)
Basophils Relative: 1.2 %
Eosinophils Absolute: 78 cells/uL (ref 15–500)
Eosinophils Relative: 1.3 %
HCT: 43.5 % (ref 38.5–50.0)
Hemoglobin: 14.6 g/dL (ref 13.2–17.1)
Lymphs Abs: 1758 cells/uL (ref 850–3900)
MCH: 31.2 pg (ref 27.0–33.0)
MCHC: 33.6 g/dL (ref 32.0–36.0)
MCV: 92.9 fL (ref 80.0–100.0)
MPV: 9.5 fL (ref 7.5–12.5)
Monocytes Relative: 10.4 %
Neutro Abs: 3468 cells/uL (ref 1500–7800)
Neutrophils Relative %: 57.8 %
Platelets: 296 10*3/uL (ref 140–400)
RBC: 4.68 10*6/uL (ref 4.20–5.80)
RDW: 12.5 % (ref 11.0–15.0)
Total Lymphocyte: 29.3 %
WBC: 6 10*3/uL (ref 3.8–10.8)

## 2019-05-31 LAB — MAGNESIUM: Magnesium: 2.1 mg/dL (ref 1.5–2.5)

## 2019-05-31 LAB — HEMOGLOBIN A1C
Hgb A1c MFr Bld: 7.3 % of total Hgb — ABNORMAL HIGH (ref <5.7)
Mean Plasma Glucose: 163 (calc)
eAG (mmol/L): 9 (calc)

## 2019-05-31 LAB — TSH: TSH: 0.02 mIU/L — ABNORMAL LOW (ref 0.40–4.50)

## 2019-05-31 LAB — LIPID PANEL
Cholesterol: 222 mg/dL — ABNORMAL HIGH (ref <200)
HDL: 48 mg/dL (ref >=40)
LDL Cholesterol (Calc): 143 mg/dL (calc) — ABNORMAL HIGH
Non-HDL Cholesterol (Calc): 174 mg/dL (calc) — ABNORMAL HIGH (ref <130)
Total CHOL/HDL Ratio: 4.6 (calc) (ref <5.0)
Triglycerides: 172 mg/dL — ABNORMAL HIGH (ref <150)

## 2019-05-31 LAB — VITAMIN D 25 HYDROXY (VIT D DEFICIENCY, FRACTURES): Vit D, 25-Hydroxy: 28 ng/mL — ABNORMAL LOW (ref 30–100)

## 2019-05-31 MED ORDER — ROSUVASTATIN CALCIUM 20 MG PO TABS: ORAL_TABLET | ORAL | 1 refills | Status: DC

## 2019-08-20 ENCOUNTER — Other Ambulatory Visit: Payer: Self-pay | Admitting: *Deleted

## 2019-08-20 DIAGNOSIS — E782 Mixed hyperlipidemia: Secondary | ICD-10-CM

## 2019-08-20 DIAGNOSIS — E139 Other specified diabetes mellitus without complications: Secondary | ICD-10-CM

## 2019-08-20 DIAGNOSIS — E119 Type 2 diabetes mellitus without complications: Secondary | ICD-10-CM

## 2019-08-20 MED ORDER — ALLOPURINOL 100 MG PO TABS: ORAL_TABLET | ORAL | 1 refills | Status: DC

## 2019-08-20 MED ORDER — LISINOPRIL 20 MG PO TABS: ORAL_TABLET | ORAL | 1 refills | Status: DC

## 2019-08-20 MED ORDER — INSULIN LISPRO PROT & LISPRO (75-25 MIX) 100 UNIT/ML KWIKPEN: PEN_INJECTOR | SUBCUTANEOUS | 1 refills | Status: DC

## 2019-08-20 MED ORDER — GLUCOSE BLOOD VI STRP: ORAL_STRIP | 6 refills | Status: AC

## 2019-08-20 MED ORDER — METFORMIN HCL ER 500 MG PO TB24: ORAL_TABLET | ORAL | 1 refills | Status: DC

## 2019-08-20 MED ORDER — LEVOTHYROXINE SODIUM 300 MCG PO TABS: ORAL_TABLET | ORAL | 1 refills | Status: DC

## 2019-08-20 MED ORDER — ROSUVASTATIN CALCIUM 20 MG PO TABS: ORAL_TABLET | ORAL | 1 refills | Status: DC

## 2019-08-22 ENCOUNTER — Other Ambulatory Visit: Payer: Self-pay | Admitting: Internal Medicine

## 2019-08-22 MED ORDER — LEVOTHYROXINE SODIUM 300 MCG PO TABS: ORAL_TABLET | ORAL | 1 refills | Status: DC

## 2019-08-29 NOTE — Progress Notes (Signed)
Patient ID: Daniel Case, male   DOB: [birth year], 59 y.o.   MRN: [id number]  Assessment and Plan:   Hypertension:  -Continue medication  -monitor blood pressure at home.  -Continue DASH diet.   -Reminder to go to the ER if any CP, SOB, nausea, dizziness, severe HA, changes vision/speech, left arm numbness and tingling, and jaw pain.  Cholesterol - LDL goal <70; titrate statin for goal - newly on rosuvastatin 40 mg daily  -Continue diet and exercise.  -Check cholesterol.   Diabetes due to LADA -Continue diet and exercise. Recommended low carb, small, frequent meals - concern with labile blood glucose and episodes of hypoglycemia as low as 38 - discussed and will order continuous glucose monitoring, keep food log to compare - has had eye exam - reports requested -Check A1C  CKD II due to diabetes -Increase fluids, avoid NSAIDS, monitor sugars, will monitor  Vitamin D Def: - back on supplement - goal 60-100 -check next OV   Hypothyroidism -check TSH level, continue medications the same, reminded to take on an empty stomach 30-27mins before food.   Gout - Continue allopurinol - Diet discussed; Check uric acid as needed  Testosterone deficiency Patient perceives lack of benefit with topical administration Will transition back to previous 400 mg IM q2 weeks per his request Medication and supplies refilled; he denies need for NVs or injection education  Continue diet and meds as discussed. Further disposition pending results of labs.  No future appointments.  HPI 58 y.o. male  presents for 3 month follow up with hypertension, hyperlipidemia, T2 diabetes with CKD, hypothyroid, gout, testosterone def and vitamin D.  He has OSA on CPAP endorses 100% with restorative sleep.   BMI is Body mass index is 27.32 kg/m., he has been working on diet but admits limited exercise. Previously was going to gym and hiking, stopped with pandemic, needs to restart.  He does watch portions for  diet and trying to follow Dr. Burman Nieves dietary guidelines (high veggies with low larb, low animal protein).  Wt Readings from Last 3 Encounters:  08/30/19 230 lb 6.4 oz (104.5 kg)  05/30/19 225 lb 12.8 oz (102.4 kg)  01/19/17 220 lb (99.8 kg)     His blood pressure has been controlled at home (130/low 70s), today their BP is BP: 130/80.    He does workout. He denies chest pain, shortness of breath, dizziness.   He is on cholesterol medication (was ? Off of atrovastatin at last check due to myalgias, newly on rosuvastatin 40 mg daily) and denies myalgias. His cholesterol is not at goal. The cholesterol last visit was:   Lab Results  Component Value Date   CHOL 118 08/30/2019   HDL 41 08/30/2019   LDLCALC 57 08/30/2019   LDLDIRECT 127.3 10/01/2009   TRIG 114 08/30/2019   CHOLHDL 2.9 08/30/2019    He has been working on diet and exercise for diabetes due to LADA, patient now on metformin and humalog 75/25 mix, 10 units AM, 15 units PM, and denies foot ulcerations, hyperglycemia, increased appetite, nausea, paresthesia of the feet, polydipsia, polyuria, visual disturbances, vomiting and weight loss.  Checks fasting: 90-140 Rotating postprandial: 160-200  Checks prior to bedtime: 160-200 He does endorse ~6 episodes of hypoglycemia, various times of the day, as low as 38!!!!! cannot correlate with any activities or dietary choices Reports did have diabetes eye exam  Last A1C in the office was:  Lab Results  Component Value Date   HGBA1C 9.3 (H) 08/30/2019  He has CKD II associated with diabetes monitored at this office:  Lab Results  Component Value Date   GFRNONAA 71 08/30/2019   Patient is on Vitamin D supplement, was off of supplement last visit, back on 4000 IU Lab Results  Component Value Date   VD25OH 28 (L) 05/30/2019     Patient underwent Thyroidectomy in 2001 for Thyroid Cancer and is on supressive and replacement therapy.  His medication was changed last visit.    Thyroxine 300 mcg - 1 tablet 3 x /week on MWF And 1/2 tab the other 4 days TThSS = 5 tabs /week  Lab Results  Component Value Date   TSH 0.01 (L) 08/30/2019   He has a history of testosterone deficiency and is on testosterone replacement. He states that the testosterone helps with his energy, libido, muscle mass. Lab Results  Component Value Date   TESTOSTERONE 525 01/19/2017     Current Medications:  Current Outpatient Medications on File Prior to Visit  Medication Sig Dispense Refill  . allopurinol (ZYLOPRIM) 100 MG tablet Take 1 tablet daily to prevent gout. 90 tablet 1  . BD PEN NEEDLE NANO U/F 32G X 4 MM MISC USE TWICE A DAY AS DIRECTED  11  . Cholecalciferol (VITAMIN D PO) Take 4,000 Int'l Units by mouth daily.    Marland Kitchen CINNAMON PO Take 1,000 mg by mouth.    Marland Kitchen glucose blood test strip Check blood sugar 3 times a day-[id number]. 100 each 6  . ibuprofen (ADVIL,MOTRIN) 200 MG tablet Take 400 mg by mouth every 6 (six) hours as needed for headache or mild pain.    . Insulin Lispro Prot & Lispro (HUMALOG MIX 75/25 KWIKPEN) (75-25) 100 UNIT/ML Kwikpen INJECT SUBCUTANEOUSLY 20  UNITS WITH FIRST MEAL OF  THE DAY AND 10 UNITS WITH  LARGEST MEAL OF THE DAY  PRIOR TO BED 30 mL 1  . levothyroxine (SYNTHROID) 300 MCG tablet Take 1 tablet daily on an empty stomach with only water for 30 minutes & no Antacid meds, Calcium or Magnesium for 4 hours & avoid Biotin 90 tablet 1  . lisinopril (ZESTRIL) 20 MG tablet Take 1 tablet daily for Diabetic Kidney protection 90 tablet 1  . metFORMIN (GLUCOPHAGE-XR) 500 MG 24 hr tablet TAKE 1 TABLET WITH  BREAKFAST AND LUNCH, AND 2  TABLETS WITH SUPPER FOR  DIABETES 360 tablet 1  . rosuvastatin (CRESTOR) 20 MG tablet Take 1 tablet daily for Cholesterol 90 tablet 1   No current facility-administered medications on file prior to visit.    Medical History:  Past Medical History:  Diagnosis Date  . Anxiety   . Cancer ([place])    thyroid  . Complication of anesthesia  2001   panic attack when going under anesthesia  . Diabetes mellitus without complication ([place])    type 2  . Heart murmur    as a teen  . Hyperlipidemia   . Hypothyroidism   . Kidney stones   . Other testicular hypofunction   . PONV (postoperative nausea and vomiting)   . Sleep apnea     Allergies:  Allergies  Allergen Reactions  . Shellfish Allergy Anaphylaxis and Rash  . Atorvastatin Other (See Comments)    Myalgias     Review of Systems:  Review of Systems  Constitutional: Negative for chills, fever, malaise/fatigue and weight loss.  HENT: Negative for congestion, ear pain, hearing loss, sore throat and tinnitus.   Eyes: Negative.  Negative for blurred vision and double vision.  Respiratory: Negative for cough, shortness of breath and wheezing.   Cardiovascular: Negative for chest pain, palpitations, orthopnea, claudication and leg swelling.  Gastrointestinal: Negative for abdominal pain, blood in stool, constipation, diarrhea, heartburn, melena, nausea and vomiting.  Genitourinary: Negative.   Musculoskeletal: Negative for joint pain and myalgias.  Skin: Negative for rash.  Neurological: Negative for dizziness, tingling, sensory change, loss of consciousness, weakness and headaches.  Endo/Heme/Allergies: Negative for polydipsia.  Psychiatric/Behavioral: Negative.  Negative for depression. The patient is not nervous/anxious and does not have insomnia.   All other systems reviewed and are negative.   Family history- Review and unchanged  Social history- Review and unchanged  Physical Exam: BP 130/80   Pulse (!) 101   Temp 97.7 F (36.5 C)   Wt 230 lb 6.4 oz (104.5 kg)   SpO2 99%   BMI 27.32 kg/m  Wt Readings from Last 3 Encounters:  08/30/19 230 lb 6.4 oz (104.5 kg)  05/30/19 225 lb 12.8 oz (102.4 kg)  01/19/17 220 lb (99.8 kg)    General Appearance: Well nourished well developed, in no apparent distress. Eyes: PERRLA, EOMs, conjunctiva no swelling or  erythema ENT/Mouth: Ear canals normal without obstruction, swelling, erythma, discharge.  TMs normal bilaterally.  Oropharynx moist, clear, without exudate, or postoropharyngeal swelling. Neck: Supple, thyroid normal,no cervical adenopathy  Respiratory: Respiratory effort normal, Breath sounds clear A&P without rhonchi, wheeze, or rale.  No retractions, no accessory usage. Cardio: RRR with no MRGs. Brisk peripheral pulses without edema.  Abdomen: Soft, + BS,  Non tender, no guarding, rebound, hernias, masses. Musculoskeletal: Full ROM, 5/5 strength, Normal gait Skin: Warm, dry without rashes, lesions, ecchymosis.  Neuro: Awake and oriented X 3, Cranial nerves intact. Normal muscle tone, no cerebellar symptoms. Psych: Normal affect, Insight and Judgment appropriate.    Izora Ribas, NP 8:18 AM [facility]

## 2019-08-30 ENCOUNTER — Other Ambulatory Visit: Payer: Self-pay

## 2019-08-30 ENCOUNTER — Ambulatory Visit: Payer: 59 | Admitting: Adult Health

## 2019-08-30 ENCOUNTER — Encounter: Payer: Self-pay | Admitting: Adult Health

## 2019-08-30 VITALS — BP 130/80 | HR 101 | Temp 97.7°F | Wt 230.4 lb

## 2019-08-30 DIAGNOSIS — E559 Vitamin D deficiency, unspecified: Secondary | ICD-10-CM | POA: Diagnosis not present

## 2019-08-30 DIAGNOSIS — E1069 Type 1 diabetes mellitus with other specified complication: Secondary | ICD-10-CM

## 2019-08-30 DIAGNOSIS — E039 Hypothyroidism, unspecified: Secondary | ICD-10-CM

## 2019-08-30 DIAGNOSIS — Z9989 Dependence on other enabling machines and devices: Secondary | ICD-10-CM

## 2019-08-30 DIAGNOSIS — R030 Elevated blood-pressure reading, without diagnosis of hypertension: Secondary | ICD-10-CM

## 2019-08-30 DIAGNOSIS — G4733 Obstructive sleep apnea (adult) (pediatric): Secondary | ICD-10-CM

## 2019-08-30 DIAGNOSIS — E349 Endocrine disorder, unspecified: Secondary | ICD-10-CM

## 2019-08-30 DIAGNOSIS — E782 Mixed hyperlipidemia: Secondary | ICD-10-CM

## 2019-08-30 DIAGNOSIS — E139 Other specified diabetes mellitus without complications: Secondary | ICD-10-CM

## 2019-08-30 DIAGNOSIS — Z79899 Other long term (current) drug therapy: Secondary | ICD-10-CM

## 2019-08-30 MED ORDER — CONTINUOUS BLOOD GLUC SENSOR MISC: 1.0000 | 0 refills | Status: DC

## 2019-08-30 MED ORDER — TESTOSTERONE CYPIONATE 200 MG/ML IM SOLN: INTRAMUSCULAR | 2 refills | Status: DC

## 2019-08-30 NOTE — Patient Instructions (Signed)
We will attempt to order continuous glucose monitoring device for you  Preventing Hypoglycemia Hypoglycemia occurs when the level of sugar (glucose) in the blood is too low. Hypoglycemia can happen in people who do or do not have diabetes (diabetes mellitus). It can develop quickly, and it can be a medical emergency. For most people with diabetes, a blood glucose level below 70 mg/dL (3.9 mmol/L) is considered hypoglycemia. Glucose is a type of sugar that provides the body's main source of energy. Certain hormones (insulin and glucagon) control the level of glucose in the blood. Insulin lowers blood glucose, and glucagon increases blood glucose. Hypoglycemia can result from having too much insulin in the bloodstream, or from not eating enough food that contains glucose. Your risk for hypoglycemia is higher:  If you take insulin or diabetes medicines to help lower your blood glucose or help your body make more insulin.  If you skip or delay a meal or snack.  If you are ill.  During and after exercise. You can prevent hypoglycemia by working with your health care provider to adjust your meal plan as needed and by taking other precautions. How can hypoglycemia affect me? Mild symptoms Mild hypoglycemia may not cause any symptoms. If you do have symptoms, they may include:  Hunger.  Anxiety.  Sweating and feeling clammy.  Dizziness or feeling light-headed.  Sleepiness.  Nausea.  Increased heart rate.  Headache.  Blurry vision.  Irritability.  Tingling or numbness around the mouth, lips, or tongue.  A change in coordination.  Restless sleep. If mild hypoglycemia is not recognized and treated, it can quickly become moderate or severe hypoglycemia. Moderate symptoms Moderate hypoglycemia can cause:  Mental confusion and poor judgment.  Behavior changes.  Weakness.  Irregular heartbeat. Severe symptoms Severe hypoglycemia is a medical emergency. It can  cause:  Fainting.  Seizures.  Loss of consciousness (coma).  Death. What nutrition changes can be made?  Work with your health care provider or diet and nutrition specialist (dietitian) to make a healthy meal plan that is right for you. Follow your meal plan carefully.  Eat meals at regular times.  If recommended by your health care provider, have snacks between meals.  Donot skip or delay meals or snacks. You can be at risk for hypoglycemia if you are not getting enough carbohydrates. What lifestyle changes can be made?   Work closely with your health care provider to manage your blood glucose. Make sure you know: ? Your goal blood glucose levels. ? How and when to check your blood glucose. ? The symptoms of hypoglycemia. It is important to treat it right away to keep it from becoming severe.  Do not drink alcohol on an empty stomach.  When you are ill, check your blood glucose more often than usual. Follow your sick day plan whenever you cannot eat or drink normally. Make this plan in advance with your health care provider.  Always check your blood glucose before, during, and after exercise. How is this treated? This condition can often be treated by immediately eating or drinking something that contains sugar, such as:  Fruit juice, 4-6 oz (120-150 mL).  Regular (not diet) soda, 4-6 oz (120-150 mL).  Low-fat milk, 4 oz (120 mL).  Several pieces of hard candy.  Sugar or honey, 1 Tbsp (15 mL). Treating hypoglycemia if you have diabetes If you are alert and able to swallow safely, follow the 15:15 rule:  Take 15 grams of a rapid-acting carbohydrate. Talk with your  health care provider about how much you should take.  Rapid-acting options include: ? Glucose pills (take 15 grams). ? 6-8 pieces of hard candy. ? 4-6 oz (120-150 mL) of fruit juice. ? 4-6 oz (120-150 mL) of regular (not diet) soda.  Check your blood glucose 15 minutes after you take the  carbohydrate.  If the repeat blood glucose level is still at or below 70 mg/dL (3.9 mmol/L), take 15 grams of a carbohydrate again.  If your blood glucose level does not increase above 70 mg/dL (3.9 mmol/L) after 3 tries, seek emergency medical care.  After your blood glucose level returns to normal, eat a meal or a snack within 1 hour. Treating severe hypoglycemia Severe hypoglycemia is when your blood glucose level is at or below 54 mg/dL (3 mmol/L). Severe hypoglycemia is a medical emergency. Get medical help right away. If you have severe hypoglycemia and you cannot eat or drink, you may need an injection of glucagon. A family member or close friend should learn how to check your blood glucose and how to give you a glucagon injection. Ask your health care provider if you need to have an emergency glucagon injection kit available. Severe hypoglycemia may need to be treated in a hospital. The treatment may include getting glucose through an IV. You may also need treatment for the cause of your hypoglycemia. Where to find more information  American Diabetes Association: www.diabetes.[employer] of Diabetes and Digestive and Kidney Diseases: [employer] Contact a health care provider if:  You have problems keeping your blood glucose in your target range.  You have frequent episodes of hypoglycemia. Get help right away if:  You continue to have hypoglycemia symptoms after eating or drinking something containing glucose.  Your blood glucose level is at or below 54 mg/dL (3 mmol/L).  You faint.  You have a seizure. These symptoms may represent a serious problem that is an emergency. Do not wait to see if the symptoms will go away. Get medical help right away. Call your local emergency services (911 in the [place] Summary  Know the symptoms of hypoglycemia, and when you are at risk for it (such as during exercise or when you are sick). Check your blood glucose often when  you are at risk for hypoglycemia.  Hypoglycemia can develop quickly, and it can be dangerous if it is not treated right away. If you have a history of severe hypoglycemia, make sure you know how to use your glucagon injection kit.  Make sure you know how to treat hypoglycemia. Keep a carbohydrate snack available when you may be at risk for hypoglycemia. This information is not intended to replace advice given to you by your health care provider. Make sure you discuss any questions you have with your health care provider. Document Revised: 12/08/2018 Document Reviewed: 04/13/2017 Elsevier Patient Education  [place].

## 2019-08-31 ENCOUNTER — Encounter: Payer: Self-pay | Admitting: Adult Health

## 2019-08-31 ENCOUNTER — Other Ambulatory Visit: Payer: Self-pay | Admitting: Adult Health

## 2019-08-31 DIAGNOSIS — E039 Hypothyroidism, unspecified: Secondary | ICD-10-CM

## 2019-08-31 LAB — CBC WITH DIFFERENTIAL/PLATELET
Absolute Monocytes: 795 cells/uL (ref 200–950)
Basophils Absolute: 57 cells/uL (ref 0–200)
Basophils Relative: 0.7 %
Eosinophils Absolute: 98 cells/uL (ref 15–500)
Eosinophils Relative: 1.2 %
HCT: 43.4 % (ref 38.5–50.0)
Hemoglobin: 15.2 g/dL (ref 13.2–17.1)
Lymphs Abs: 1468 cells/uL (ref 850–3900)
MCH: 32.6 pg (ref 27.0–33.0)
MCHC: 35 g/dL (ref 32.0–36.0)
MCV: 93.1 fL (ref 80.0–100.0)
MPV: 10.2 fL (ref 7.5–12.5)
Monocytes Relative: 9.7 %
Neutro Abs: 5781 cells/uL (ref 1500–7800)
Neutrophils Relative %: 70.5 %
Platelets: 279 10*3/uL (ref 140–400)
RBC: 4.66 10*6/uL (ref 4.20–5.80)
RDW: 12.3 % (ref 11.0–15.0)
Total Lymphocyte: 17.9 %
WBC: 8.2 10*3/uL (ref 3.8–10.8)

## 2019-08-31 LAB — HEMOGLOBIN A1C
Hgb A1c MFr Bld: 9.3 % of total Hgb — ABNORMAL HIGH (ref <5.7)
Mean Plasma Glucose: 220 (calc)
eAG (mmol/L): 12.2 (calc)

## 2019-08-31 LAB — COMPLETE METABOLIC PANEL WITH GFR
AG Ratio: 1.7 (calc) (ref 1.0–2.5)
ALT: 21 U/L (ref 9–46)
AST: 15 U/L (ref 10–35)
Albumin: 4.5 g/dL (ref 3.6–5.1)
Alkaline phosphatase (APISO): 81 U/L (ref 35–144)
BUN: 24 mg/dL (ref 7–25)
CO2: 29 mmol/L (ref 20–32)
Calcium: 10.1 mg/dL (ref 8.6–10.3)
Chloride: 102 mmol/L (ref 98–110)
Creat: 1.13 mg/dL (ref 0.70–1.33)
GFR, Est African American: 82 mL/min/{1.73_m2} (ref >=60)
GFR, Est Non African American: 71 mL/min/{1.73_m2} (ref >=60)
Globulin: 2.6 g/dL (calc) (ref 1.9–3.7)
Glucose, Bld: 239 mg/dL — ABNORMAL HIGH (ref 65–99)
Potassium: 4.7 mmol/L (ref 3.5–5.3)
Sodium: 139 mmol/L (ref 135–146)
Total Bilirubin: 0.4 mg/dL (ref 0.2–1.2)
Total Protein: 7.1 g/dL (ref 6.1–8.1)

## 2019-08-31 LAB — LIPID PANEL
Cholesterol: 118 mg/dL (ref <200)
HDL: 41 mg/dL (ref >=40)
LDL Cholesterol (Calc): 57 mg/dL (calc)
Non-HDL Cholesterol (Calc): 77 mg/dL (calc) (ref <130)
Total CHOL/HDL Ratio: 2.9 (calc) (ref <5.0)
Triglycerides: 114 mg/dL (ref <150)

## 2019-08-31 LAB — TSH: TSH: 0.01 mIU/L — ABNORMAL LOW (ref 0.40–4.50)

## 2019-08-31 LAB — MAGNESIUM: Magnesium: 1.9 mg/dL (ref 1.5–2.5)

## 2019-09-06 ENCOUNTER — Other Ambulatory Visit: Payer: Self-pay | Admitting: *Deleted

## 2019-09-06 DIAGNOSIS — E1069 Type 1 diabetes mellitus with other specified complication: Secondary | ICD-10-CM

## 2019-09-06 MED ORDER — CONTINUOUS BLOOD GLUC SENSOR MISC: 1.0000 | 0 refills | Status: AC

## 2019-09-21 ENCOUNTER — Other Ambulatory Visit: Payer: Self-pay

## 2019-09-21 MED ORDER — "BD ECLIPSE SYRINGE 21G X 1"" 3 ML MISC": 0 refills | Status: DC

## 2019-09-21 MED ORDER — "BD ECLIPSE NEEDLE 21G X 1"" MISC": 0 refills | Status: DC

## 2019-10-04 ENCOUNTER — Other Ambulatory Visit: Payer: Self-pay | Admitting: *Deleted

## 2019-10-04 DIAGNOSIS — E1069 Type 1 diabetes mellitus with other specified complication: Secondary | ICD-10-CM

## 2019-10-04 MED ORDER — "BD ECLIPSE SYRINGE 21G X 1"" 3 ML MISC": 0 refills | Status: DC

## 2019-10-04 MED ORDER — FREESTYLE LIBRE 14 DAY SENSOR MISC: 3 refills | Status: DC

## 2019-10-09 ENCOUNTER — Telehealth: Payer: Self-pay | Admitting: *Deleted

## 2019-10-09 NOTE — Telephone Encounter (Signed)
[employer] approved by [employer].

## 2019-10-11 ENCOUNTER — Other Ambulatory Visit: Payer: Self-pay

## 2019-10-11 ENCOUNTER — Other Ambulatory Visit: Payer: 59

## 2019-10-11 DIAGNOSIS — E039 Hypothyroidism, unspecified: Secondary | ICD-10-CM

## 2019-10-11 LAB — TSH: TSH: 4.77 mIU/L — ABNORMAL HIGH (ref 0.40–4.50)

## 2019-11-09 NOTE — Progress Notes (Signed)
Patient ID: Daniel Case, male   DOB: [birth year], 59 y.o.   MRN: [id number]  Assessment and Plan:   Hypertension:  -Continue medication  -monitor blood pressure at home.  -  goal is <130/80 -Continue DASH diet.   -Reminder to go to the ER if any CP, SOB, nausea, dizziness, severe HA, changes vision/speech, left arm numbness and tingling, and jaw pain.  Cholesterol - LDL goal <70; at goal; continue statin  -Continue diet and exercise.  -Check cholesterol.   Diabetes due to LADA  -Continue diet and exercise. Recommended low carb, small, frequent meals - concern with labile blood glucose are improved with continuous glucose monitor - taking insulin 15 units AM and PM, unclear why this dose, contradicts instructions - concern possible Dawn phenomenon- will have reduce PM insulin to 10 units - goal is fasting glucose < bedtime glucose, then titrate diet or insulin as needed for fasting glucose <130 -Check A1C  CKD II due to diabetes -Increase fluids, avoid NSAIDS, monitor sugars, will monitor  Vitamin D Def: - back on supplement - goal 60-100 -check level today   Hypothyroidism -check TSH level, continue medications the same, reminded to take on an empty stomach (needs to be 30 min prior to coffee with water only)   Gout - Continue allopurinol - Diet discussed; Check uric acid as needed  Testosterone deficiency Continued perceived benefits with supplementation, no SE. Check level; no symptoms of elevated estrogen or dihydrotestosterone.   Left cerumen impaction - stop using Qtips, irrigation used in the office without complications, use OTC drops/oil at home to prevent reoccurence   Continue diet and meds as discussed. Further disposition pending results of labs.  No future appointments.  HPI 59 y.o. male  presents for 3 month follow up with hypertension, hyperlipidemia, T2 diabetes with CKD, hypothyroid, gout, testosterone def and vitamin D.  He has OSA on CPAP endorses  100% with restorative sleep.   BMI is Body mass index is 28.32 kg/m., he has been working on diet but admits limited exercise. Previously was going to gym and hiking, stopped with pandemic, needs to restart, is trying to take walks on days he doesn't work.  He does watch portions for diet and trying to follow Dr. Burman Nieves dietary guidelines (high veggies with low larb, low animal protein).  He checks weight at home, down 2 lb to 232 lb, his goal is <220lb Wt Readings from Last 3 Encounters:  11/12/19 238 lb 12.8 oz (108.3 kg)  08/30/19 230 lb 6.4 oz (104.5 kg)  05/30/19 225 lb 12.8 oz (102.4 kg)     His blood pressure has been controlled at home (120-130/low 70s), today their BP is BP: (!) 140/98.    He does workout. He denies chest pain, shortness of breath, dizziness.   He is on cholesterol medication (on rosuvastatin 40 mg daily) and denies myalgias. His cholesterol is at goal. The cholesterol last visit was:   Lab Results  Component Value Date   CHOL 118 08/30/2019   HDL 41 08/30/2019   LDLCALC 57 08/30/2019   LDLDIRECT 127.3 10/01/2009   TRIG 114 08/30/2019   CHOLHDL 2.9 08/30/2019     He has been working on diet and exercise for diabetes due to LADA, patient now on metformin and humalog 75/25 mix, reports has been taking 15 units AM, 15 units PM, and denies foot ulcerations, hyperglycemia, increased appetite, nausea, paresthesia of the feet, polydipsia, polyuria, visual disturbances, vomiting and weight loss. He is on ACEi, statin;  He reported numerous episodes of hypoglycemia at various times of the day at last visit, as low as 38, continuous glucose monitoring device was ordered with instructions to keep close food log. Today he reports has had some episodes of hypoglycemia prior to bedtime.   Checks fasting: 100-120 Post pandrial peak: 180-200 Rotating postprandial: 130-140 Checks prior to bedtime: 110-140. If <130 will eat a snack of hummus and crackers He reports has had  some readings in the 80s at bedtime,  Typically reports that bedtime glucose is lower than fasting/AM Reports did have diabetes eye exam  Last A1C in the office was:  Lab Results  Component Value Date   HGBA1C 9.3 (H) 08/30/2019    He has CKD II associated with diabetes monitored at this office:  Lab Results  Component Value Date   GFRNONAA 71 08/30/2019   Patient is on Vitamin D supplement, was off of supplement last visit, back on 4000 IU Lab Results  Component Value Date   VD25OH 28 (L) 05/30/2019     Patient underwent Thyroidectomy in 2001 for Thyroid Cancer and is on supressive and replacement therapy.  His medication was not changed last visit as he reported was taking with coffee rather than water on empty stomach. He admits still drinking coffee with splash of milk on the way to work.   Thyroxine 300 mcg - 1 tablet 3 x /week on MWF And 1/2 tab the other 4 days TThSS = 5 tabs /week  Lab Results  Component Value Date   TSH 4.77 (H) 10/11/2019   He has a history of testosterone deficiency and is on testosterone replacement, was on topical therapy but was recently transitioned back to injection with perceived improved benefit, taking 400 mg every 2 weeks, last injection was 11/02/2019. He states that the testosterone helps with his energy, libido, muscle mass. Lab Results  Component Value Date   TESTOSTERONE 525 01/19/2017     Current Medications:  Current Outpatient Medications on File Prior to Visit  Medication Sig Dispense Refill  . allopurinol (ZYLOPRIM) 100 MG tablet Take 1 tablet daily to prevent gout. 90 tablet 1  . BD PEN NEEDLE NANO U/F 32G X 4 MM MISC USE TWICE A DAY AS DIRECTED  11  . Cholecalciferol (VITAMIN D PO) Take 4,000 Int'l Units by mouth daily.    Marland Kitchen CINNAMON PO Take 1,000 mg by mouth.    . Continuous Blood Gluc Sensor (FREESTYLE LIBRE 14 DAY SENSOR) MISC Apply new sensor every 14 days. DX-E10.9.Check blood sugar 3 times a day. 4 each 3  . Continuous  Blood Gluc Sensor MISC 1 each by Does not apply route as directed. Check blood sugar 3 times a day Dx:E10.9 1 each 0  . glucose blood test strip Check blood sugar 3 times a day-DX-E13.9. 100 each 6  . ibuprofen (ADVIL,MOTRIN) 200 MG tablet Take 400 mg by mouth every 6 (six) hours as needed for headache or mild pain.    . Insulin Lispro Prot & Lispro (HUMALOG MIX 75/25 KWIKPEN) (75-25) 100 UNIT/ML Kwikpen INJECT SUBCUTANEOUSLY 20  UNITS WITH FIRST MEAL OF  THE DAY AND 10 UNITS WITH  LARGEST MEAL OF THE DAY  PRIOR TO BED 30 mL 1  . levothyroxine (SYNTHROID) 300 MCG tablet Take 1 tablet daily on an empty stomach with only water for 30 minutes & no Antacid meds, Calcium or Magnesium for 4 hours & avoid Biotin 90 tablet 1  . lisinopril (ZESTRIL) 20 MG tablet Take 1 tablet  daily for Diabetic Kidney protection 90 tablet 1  . metFORMIN (GLUCOPHAGE-XR) 500 MG 24 hr tablet TAKE 1 TABLET WITH  BREAKFAST AND LUNCH, AND 2  TABLETS WITH SUPPER FOR  DIABETES 360 tablet 1  . NEEDLE, DISP, 21 G (BD ECLIPSE NEEDLE) 21G X 1" MISC Use to inject 2 mL intramuscularly every 14 days. 100 each 0  . rosuvastatin (CRESTOR) 20 MG tablet Take 1 tablet daily for Cholesterol 90 tablet 1  . SYRINGE-NEEDLE, DISP, 3 ML (BD ECLIPSE SYRINGE) 21G X 1" 3 ML MISC Use to inject 2 mL intramuscularly every 14 days 100 each 0  . testosterone cypionate (DEPOTESTOSTERONE CYPIONATE) 200 MG/ML injection INJECT 2 MILLILITERS AS DIRECTED INTRAMUSCULARLY EVERY 14 DAYS 12 mL 2   No current facility-administered medications on file prior to visit.    Medical History:  Past Medical History:  Diagnosis Date  . Anxiety   . Cancer ([place])    thyroid  . Complication of anesthesia 2001   panic attack when going under anesthesia  . Diabetes mellitus without complication ([place])    type 2  . Heart murmur    as a teen  . Hyperlipidemia   . Hypothyroidism   . Kidney stones   . Other testicular hypofunction   . PONV (postoperative nausea and vomiting)    . Sleep apnea     Allergies:  Allergies  Allergen Reactions  . Shellfish Allergy Anaphylaxis and Rash  . Atorvastatin Other (See Comments)    Myalgias     Review of Systems:  Review of Systems  Constitutional: Negative for chills, fever, malaise/fatigue and weight loss.  HENT: Negative for congestion, ear pain, hearing loss, sore throat and tinnitus.   Eyes: Negative.  Negative for blurred vision and double vision.  Respiratory: Negative for cough, shortness of breath and wheezing.   Cardiovascular: Negative for chest pain, palpitations, orthopnea, claudication and leg swelling.  Gastrointestinal: Negative for abdominal pain, blood in stool, constipation, diarrhea, heartburn, melena, nausea and vomiting.  Genitourinary: Negative.   Musculoskeletal: Negative for joint pain and myalgias.  Skin: Negative for rash.  Neurological: Negative for dizziness, tingling, sensory change, loss of consciousness, weakness and headaches.  Endo/Heme/Allergies: Negative for polydipsia.  Psychiatric/Behavioral: Negative.  Negative for depression. The patient is not nervous/anxious and does not have insomnia.   All other systems reviewed and are negative.   Family history- Review and unchanged  Social history- Review and unchanged  Physical Exam: BP (!) 140/98   Pulse 91   Temp (!) 97.3 F (36.3 C)   Wt 238 lb 12.8 oz (108.3 kg)   SpO2 97%   BMI 28.32 kg/m  Wt Readings from Last 3 Encounters:  11/12/19 238 lb 12.8 oz (108.3 kg)  08/30/19 230 lb 6.4 oz (104.5 kg)  05/30/19 225 lb 12.8 oz (102.4 kg)    General Appearance: Well nourished well developed, in no apparent distress. Eyes: PERRLA, EOMs, conjunctiva no swelling or erythema ENT/Mouth: R Ear canal normal without obstruction, swelling, erythma, discharge. L obstructed by firm cerumen.  TMs normal bilaterally after irrigation.  Oropharynx moist, clear, without exudate, or postoropharyngeal swelling. Neck: Supple, thyroid normal,no  cervical adenopathy  Respiratory: Respiratory effort normal, Breath sounds clear A&P without rhonchi, wheeze, or rale.  No retractions, no accessory usage. Cardio: RRR with no MRGs. Brisk peripheral pulses without edema.  Abdomen: Soft, + BS,  Non tender, no guarding, rebound, hernias, masses. Musculoskeletal: Full ROM, 5/5 strength, Normal gait Skin: Warm, dry without rashes, lesions, ecchymosis.  Neuro:  Awake and oriented X 3, Cranial nerves intact. Normal muscle tone, no cerebellar symptoms. Psych: Normal affect, Insight and Judgment appropriate.    Izora Ribas, NP 4:23 PM [facility]

## 2019-11-12 ENCOUNTER — Ambulatory Visit: Payer: 59 | Admitting: Adult Health

## 2019-11-12 ENCOUNTER — Encounter: Payer: Self-pay | Admitting: Adult Health

## 2019-11-12 ENCOUNTER — Other Ambulatory Visit: Payer: Self-pay

## 2019-11-12 VITALS — BP 140/98 | HR 91 | Temp 97.3°F | Wt 238.8 lb

## 2019-11-12 DIAGNOSIS — E1069 Type 1 diabetes mellitus with other specified complication: Secondary | ICD-10-CM | POA: Diagnosis not present

## 2019-11-12 DIAGNOSIS — E039 Hypothyroidism, unspecified: Secondary | ICD-10-CM

## 2019-11-12 DIAGNOSIS — E349 Endocrine disorder, unspecified: Secondary | ICD-10-CM

## 2019-11-12 DIAGNOSIS — E139 Other specified diabetes mellitus without complications: Secondary | ICD-10-CM

## 2019-11-12 DIAGNOSIS — G4733 Obstructive sleep apnea (adult) (pediatric): Secondary | ICD-10-CM | POA: Diagnosis not present

## 2019-11-12 DIAGNOSIS — N521 Erectile dysfunction due to diseases classified elsewhere: Secondary | ICD-10-CM

## 2019-11-12 DIAGNOSIS — Z79899 Other long term (current) drug therapy: Secondary | ICD-10-CM

## 2019-11-12 DIAGNOSIS — H6122 Impacted cerumen, left ear: Secondary | ICD-10-CM

## 2019-11-12 DIAGNOSIS — E1169 Type 2 diabetes mellitus with other specified complication: Secondary | ICD-10-CM

## 2019-11-12 DIAGNOSIS — I10 Essential (primary) hypertension: Secondary | ICD-10-CM

## 2019-11-12 DIAGNOSIS — Z9989 Dependence on other enabling machines and devices: Secondary | ICD-10-CM

## 2019-11-12 DIAGNOSIS — E782 Mixed hyperlipidemia: Secondary | ICD-10-CM

## 2019-11-12 DIAGNOSIS — E559 Vitamin D deficiency, unspecified: Secondary | ICD-10-CM

## 2019-11-12 MED ORDER — INSULIN LISPRO PROT & LISPRO (75-25 MIX) 100 UNIT/ML KWIKPEN: PEN_INJECTOR | SUBCUTANEOUS | 1 refills | Status: DC

## 2019-11-12 MED ORDER — LISINOPRIL 20 MG PO TABS: ORAL_TABLET | ORAL | 1 refills | Status: DC

## 2019-11-12 NOTE — Patient Instructions (Addendum)
Goals    . Blood Pressure < 130/80    . HEMOGLOBIN A1C < 7    . Peak Blood Glucose <130       I think you are experiencing "Dawn phenomenon" or Somogyi effect - this is when your sugar gets too low overnight and you have a higher fasting insulin  We can fix this by decreasing evening insulin OR by adding a night time snack  Try cutting back to 10 units in the evening and see how you do - goal is for bedtime sugar to be higher than fasting morning sugar, but still have fasting sugars typically <130  May need to slowly increase evening insulin back up if morning is lower than night, but running 130+     Please start taking thyroid medication a full 30 min before your coffee with WATER ONLY      Preventing Hypoglycemia Hypoglycemia occurs when the level of sugar (glucose) in the blood is too low. Hypoglycemia can happen in people who do or do not have diabetes (diabetes mellitus). It can develop quickly, and it can be a medical emergency. For most people with diabetes, a blood glucose level below 70 mg/dL (3.9 mmol/L) is considered hypoglycemia. Glucose is a type of sugar that provides the body's main source of energy. Certain hormones (insulin and glucagon) control the level of glucose in the blood. Insulin lowers blood glucose, and glucagon increases blood glucose. Hypoglycemia can result from having too much insulin in the bloodstream, or from not eating enough food that contains glucose. Your risk for hypoglycemia is higher:  If you take insulin or diabetes medicines to help lower your blood glucose or help your body make more insulin.  If you skip or delay a meal or snack.  If you are ill.  During and after exercise. You can prevent hypoglycemia by working with your health care provider to adjust your meal plan as needed and by taking other precautions. How can hypoglycemia affect me? Mild symptoms Mild hypoglycemia may not cause any symptoms. If you do have symptoms, they may  include:  Hunger.  Anxiety.  Sweating and feeling clammy.  Dizziness or feeling light-headed.  Sleepiness.  Nausea.  Increased heart rate.  Headache.  Blurry vision.  Irritability.  Tingling or numbness around the mouth, lips, or tongue.  A change in coordination.  Restless sleep. If mild hypoglycemia is not recognized and treated, it can quickly become moderate or severe hypoglycemia. Moderate symptoms Moderate hypoglycemia can cause:  Mental confusion and poor judgment.  Behavior changes.  Weakness.  Irregular heartbeat. Severe symptoms Severe hypoglycemia is a medical emergency. It can cause:  Fainting.  Seizures.  Loss of consciousness (coma).  Death. What nutrition changes can be made?  Work with your health care provider or diet and nutrition specialist (dietitian) to make a healthy meal plan that is right for you. Follow your meal plan carefully.  Eat meals at regular times.  If recommended by your health care provider, have snacks between meals.  Donot skip or delay meals or snacks. You can be at risk for hypoglycemia if you are not getting enough carbohydrates. What lifestyle changes can be made?   Work closely with your health care provider to manage your blood glucose. Make sure you know: ? Your goal blood glucose levels. ? How and when to check your blood glucose. ? The symptoms of hypoglycemia. It is important to treat it right away to keep it from becoming severe.  Do not  drink alcohol on an empty stomach.  When you are ill, check your blood glucose more often than usual. Follow your sick day plan whenever you cannot eat or drink normally. Make this plan in advance with your health care provider.  Always check your blood glucose before, during, and after exercise. How is this treated? This condition can often be treated by immediately eating or drinking something that contains sugar, such as:  Fruit juice, 4-6 oz (120-150  mL).  Regular (not diet) soda, 4-6 oz (120-150 mL).  Low-fat milk, 4 oz (120 mL).  Several pieces of hard candy.  Sugar or honey, 1 Tbsp (15 mL). Treating hypoglycemia if you have diabetes If you are alert and able to swallow safely, follow the 15:15 rule:  Take 15 grams of a rapid-acting carbohydrate. Talk with your health care provider about how much you should take.  Rapid-acting options include: ? Glucose pills (take 15 grams). ? 6-8 pieces of hard candy. ? 4-6 oz (120-150 mL) of fruit juice. ? 4-6 oz (120-150 mL) of regular (not diet) soda.  Check your blood glucose 15 minutes after you take the carbohydrate.  If the repeat blood glucose level is still at or below 70 mg/dL (3.9 mmol/L), take 15 grams of a carbohydrate again.  If your blood glucose level does not increase above 70 mg/dL (3.9 mmol/L) after 3 tries, seek emergency medical care.  After your blood glucose level returns to normal, eat a meal or a snack within 1 hour. Treating severe hypoglycemia Severe hypoglycemia is when your blood glucose level is at or below 54 mg/dL (3 mmol/L). Severe hypoglycemia is a medical emergency. Get medical help right away. If you have severe hypoglycemia and you cannot eat or drink, you may need an injection of glucagon. A family member or close friend should learn how to check your blood glucose and how to give you a glucagon injection. Ask your health care provider if you need to have an emergency glucagon injection kit available. Severe hypoglycemia may need to be treated in a hospital. The treatment may include getting glucose through an IV. You may also need treatment for the cause of your hypoglycemia. Where to find more information  American Diabetes Association: www.diabetes.[employer] of Diabetes and Digestive and Kidney Diseases: [employer] Contact a health care provider if:  You have problems keeping your blood glucose in your target range.  You  have frequent episodes of hypoglycemia. Get help right away if:  You continue to have hypoglycemia symptoms after eating or drinking something containing glucose.  Your blood glucose level is at or below 54 mg/dL (3 mmol/L).  You faint.  You have a seizure. These symptoms may represent a serious problem that is an emergency. Do not wait to see if the symptoms will go away. Get medical help right away. Call your local emergency services (911 in the [place] Summary  Know the symptoms of hypoglycemia, and when you are at risk for it (such as during exercise or when you are sick). Check your blood glucose often when you are at risk for hypoglycemia.  Hypoglycemia can develop quickly, and it can be dangerous if it is not treated right away. If you have a history of severe hypoglycemia, make sure you know how to use your glucagon injection kit.  Make sure you know how to treat hypoglycemia. Keep a carbohydrate snack available when you may be at risk for hypoglycemia. This information is not intended to replace advice given  to you by your health care provider. Make sure you discuss any questions you have with your health care provider. Document Revised: 12/08/2018 Document Reviewed: 04/13/2017 Elsevier Patient Education  [place].

## 2019-11-13 ENCOUNTER — Other Ambulatory Visit: Payer: Self-pay | Admitting: Adult Health

## 2019-11-13 LAB — TSH: TSH: 2.12 mIU/L (ref 0.40–4.50)

## 2019-11-13 LAB — HEMOGLOBIN A1C
Hgb A1c MFr Bld: 7.5 % of total Hgb — ABNORMAL HIGH (ref <5.7)
Mean Plasma Glucose: 169 (calc)
eAG (mmol/L): 9.3 (calc)

## 2019-11-13 LAB — CBC WITH DIFFERENTIAL/PLATELET
Absolute Monocytes: 876 cells/uL (ref 200–950)
Basophils Absolute: 77 cells/uL (ref 0–200)
Basophils Relative: 0.9 %
Eosinophils Absolute: 128 cells/uL (ref 15–500)
Eosinophils Relative: 1.5 %
HCT: 46.5 % (ref 38.5–50.0)
Hemoglobin: 15.9 g/dL (ref 13.2–17.1)
Lymphs Abs: 2074 cells/uL (ref 850–3900)
MCH: 32.4 pg (ref 27.0–33.0)
MCHC: 34.2 g/dL (ref 32.0–36.0)
MCV: 94.7 fL (ref 80.0–100.0)
MPV: 9.7 fL (ref 7.5–12.5)
Monocytes Relative: 10.3 %
Neutro Abs: 5347 cells/uL (ref 1500–7800)
Neutrophils Relative %: 62.9 %
Platelets: 291 10*3/uL (ref 140–400)
RBC: 4.91 10*6/uL (ref 4.20–5.80)
RDW: 12.7 % (ref 11.0–15.0)
Total Lymphocyte: 24.4 %
WBC: 8.5 10*3/uL (ref 3.8–10.8)

## 2019-11-13 LAB — COMPLETE METABOLIC PANEL WITH GFR
AG Ratio: 2 (calc) (ref 1.0–2.5)
ALT: 20 U/L (ref 9–46)
AST: 19 U/L (ref 10–35)
Albumin: 4.8 g/dL (ref 3.6–5.1)
Alkaline phosphatase (APISO): 60 U/L (ref 35–144)
BUN: 21 mg/dL (ref 7–25)
CO2: 28 mmol/L (ref 20–32)
Calcium: 10.1 mg/dL (ref 8.6–10.3)
Chloride: 106 mmol/L (ref 98–110)
Creat: 1.12 mg/dL (ref 0.70–1.33)
GFR, Est African American: 83 mL/min/{1.73_m2} (ref >=60)
GFR, Est Non African American: 72 mL/min/{1.73_m2} (ref >=60)
Globulin: 2.4 g/dL (calc) (ref 1.9–3.7)
Glucose, Bld: 120 mg/dL — ABNORMAL HIGH (ref 65–99)
Potassium: 4.2 mmol/L (ref 3.5–5.3)
Sodium: 135 mmol/L (ref 135–146)
Total Bilirubin: 0.5 mg/dL (ref 0.2–1.2)
Total Protein: 7.2 g/dL (ref 6.1–8.1)

## 2019-11-13 LAB — LIPID PANEL
Cholesterol: 133 mg/dL (ref <200)
HDL: 41 mg/dL (ref >=40)
LDL Cholesterol (Calc): 73 mg/dL (calc)
Non-HDL Cholesterol (Calc): 92 mg/dL (calc) (ref <130)
Total CHOL/HDL Ratio: 3.2 (calc) (ref <5.0)
Triglycerides: 103 mg/dL (ref <150)

## 2019-11-13 LAB — TESTOSTERONE: Testosterone: 2070 ng/dL — ABNORMAL HIGH (ref 250–827)

## 2019-11-13 LAB — MAGNESIUM: Magnesium: 1.9 mg/dL (ref 1.5–2.5)

## 2019-11-13 LAB — VITAMIN D 25 HYDROXY (VIT D DEFICIENCY, FRACTURES): Vit D, 25-Hydroxy: 46 ng/mL (ref 30–100)

## 2019-11-13 MED ORDER — TESTOSTERONE CYPIONATE 200 MG/ML IM SOLN: INTRAMUSCULAR | 2 refills | Status: DC

## 2020-02-14 ENCOUNTER — Encounter: Payer: 59 | Admitting: Adult Health

## 2020-02-14 ENCOUNTER — Other Ambulatory Visit: Payer: Self-pay

## 2020-02-14 ENCOUNTER — Encounter: Payer: Self-pay | Admitting: Physician Assistant

## 2020-02-14 ENCOUNTER — Ambulatory Visit: Payer: 59 | Admitting: Physician Assistant

## 2020-02-14 VITALS — BP 124/72 | HR 60 | Temp 97.7°F | Ht 69.5 in | Wt 234.0 lb

## 2020-02-14 DIAGNOSIS — I10 Essential (primary) hypertension: Secondary | ICD-10-CM

## 2020-02-14 DIAGNOSIS — G4733 Obstructive sleep apnea (adult) (pediatric): Secondary | ICD-10-CM

## 2020-02-14 DIAGNOSIS — M79605 Pain in left leg: Secondary | ICD-10-CM

## 2020-02-14 DIAGNOSIS — E559 Vitamin D deficiency, unspecified: Secondary | ICD-10-CM

## 2020-02-14 DIAGNOSIS — Z125 Encounter for screening for malignant neoplasm of prostate: Secondary | ICD-10-CM

## 2020-02-14 DIAGNOSIS — R918 Other nonspecific abnormal finding of lung field: Secondary | ICD-10-CM

## 2020-02-14 DIAGNOSIS — R1310 Dysphagia, unspecified: Secondary | ICD-10-CM

## 2020-02-14 DIAGNOSIS — Z87442 Personal history of urinary calculi: Secondary | ICD-10-CM

## 2020-02-14 DIAGNOSIS — T17908A Unspecified foreign body in respiratory tract, part unspecified causing other injury, initial encounter: Secondary | ICD-10-CM

## 2020-02-14 DIAGNOSIS — R059 Cough, unspecified: Secondary | ICD-10-CM

## 2020-02-14 DIAGNOSIS — Z79899 Other long term (current) drug therapy: Secondary | ICD-10-CM

## 2020-02-14 DIAGNOSIS — D649 Anemia, unspecified: Secondary | ICD-10-CM

## 2020-02-14 DIAGNOSIS — Z0001 Encounter for general adult medical examination with abnormal findings: Secondary | ICD-10-CM

## 2020-02-14 DIAGNOSIS — E1169 Type 2 diabetes mellitus with other specified complication: Secondary | ICD-10-CM

## 2020-02-14 DIAGNOSIS — Z136 Encounter for screening for cardiovascular disorders: Secondary | ICD-10-CM | POA: Diagnosis not present

## 2020-02-14 DIAGNOSIS — N281 Cyst of kidney, acquired: Secondary | ICD-10-CM

## 2020-02-14 DIAGNOSIS — E039 Hypothyroidism, unspecified: Secondary | ICD-10-CM

## 2020-02-14 DIAGNOSIS — M79604 Pain in right leg: Secondary | ICD-10-CM

## 2020-02-14 DIAGNOSIS — E349 Endocrine disorder, unspecified: Secondary | ICD-10-CM

## 2020-02-14 DIAGNOSIS — Z0000 Encounter for general adult medical examination without abnormal findings: Secondary | ICD-10-CM

## 2020-02-14 DIAGNOSIS — F419 Anxiety disorder, unspecified: Secondary | ICD-10-CM

## 2020-02-14 DIAGNOSIS — E1069 Type 1 diabetes mellitus with other specified complication: Secondary | ICD-10-CM

## 2020-02-14 DIAGNOSIS — E139 Other specified diabetes mellitus without complications: Secondary | ICD-10-CM

## 2020-02-14 MED ORDER — OLMESARTAN MEDOXOMIL 20 MG PO TABS: 20.0000 mg | ORAL_TABLET | Freq: Every day | ORAL | 1 refills | Status: DC

## 2020-02-14 NOTE — Progress Notes (Signed)
Complete Physical  Assessment and Plan:  Mixed diabetic hyperlipidemia associated with type 1 diabetes mellitus (HCC) -     Lipid panel check lipids decrease fatty foods increase activity.   LADA (latent autoimmune diabetes in adults), managed as type 1 ([place]) -     Hemoglobin A1c -     Urinalysis, Routine w reflex microscopic -     Microalbumin / creatinine urine ratio Discussed general issues about diabetes pathophysiology and management., Educational material distributed., Suggested low cholesterol diet., Encouraged aerobic exercise., Discussed foot care., Reminded to get yearly retinal exam.  Erectile dysfunction associated with type 2 diabetes mellitus (HCC) -     Testosterone  Hypothyroidism, unspecified type -     TSH  Essential hypertension - continue medications, DASH diet, exercise and monitor at home. Call if greater than 130/80.  Will switch ACE to ARB- follow up 1 month- monitor BP -     CBC with Differential/Platelet -     COMPLETE METABOLIC PANEL WITH GFR -     Urinalysis, Routine w reflex microscopic -     Microalbumin / creatinine urine ratio -     EKG 12-Lead -     [place], RETROPERITNL ABD,  LTD -     olmesartan (BENICAR) 20 MG tablet; Take 1 tablet (20 mg total) by mouth daily.  OSA on CPAP Sleep apnea- continue CPAP, CPAP is helping with daytime fatigue, weight loss still advised.   Medication management -     Magnesium  Vitamin D deficiency -     VITAMIN D 25 Hydroxy (Vit-D Deficiency, Fractures)  Testosterone deficiency -     Testosterone  NEPHROLITHIASIS, HX OF Increase fluids  RENAL CYST Simple cyst via [place]  Anxiety -continue medications, stress management techniques discussed, increase water, good sleep hygiene discussed, increase exercise, and increase veggies.   Cough -     DG Chest 2 View; Future -     olmesartan (BENICAR) 20 MG tablet; Take 1 tablet (20 mg total) by mouth daily. ? From ACE will switch to ARB, get CXR Get on allergy  pill/flonase  Anemia, unspecified type -     Iron,Total/Total Iron Binding Cap -     Vitamin B12  Screening PSA (prostate specific antigen) -     PSA  Leg pain bilateral Worse at night, patient has varicose veins, no evidence of DVT on exam Compression socks recommended, check labs Discussed signs of DVT but patient low risk   Discussed med's effects and SE's. Screening labs and tests as requested with regular follow-up as recommended. Over 40 minutes of exam, counseling, chart review and critical decision making was performed Future Appointments  Date Time Provider [place]  02/16/2021  3:00 PM Vicie Mutters, PA-C [id number] None    HPI Patient presents for a complete physical.   He complains of bilateral leg heaviness/achy in bilateral calf muscles and right ankle achy. States he tries to stay hydrated. He has had deep calf massage that did not help. It is better in the morning.  No warmth, redness, swelling. Will have occ sharp cramps.   He states he is having a burning and achiness in the morning when he gets up. Will be worse with deep breath x 6-8 months. He has had a cough x 5 years. He is on lisinopril.  He is on a CPAP.  He is on testosterone, no personal or family history of blood clots, no surgery, no long car rides other than my[place] in Jan.  His blood pressure has been controlled at home, today their BP is BP: 124/72 He does not workout but is active out side. He denies chest pain, shortness of breath, dizziness.  Former cigar smoker Q in 2014- VERY RARE USE.   He has been working on diet and exercise for diabetes due to LADA not at goal of 69 With hyperlipidemia on crestor 20 mg changed last time With CKD stage 2 metformin  humalog 75/25 mix, 15 units AM, 10 units PM [facility]- saw 1 year ago denies foot ulcerations, hyperglycemia, increased appetite, nausea, paresthesia of the feet, polydipsia, polyuria, visual disturbances, vomiting and  weight loss.  Lab Results  Component Value Date   HGBA1C 7.5 (H) 11/12/2019   Had Renal US 2011 Last GFR: Lab Results  Component Value Date   GFRNONAA 72 11/12/2019   Lab Results  Component Value Date   CHOL 133 11/12/2019   HDL 41 11/12/2019   LDLCALC 73 11/12/2019   LDLDIRECT 127.3 10/01/2009   TRIG 103 11/12/2019   CHOLHDL 3.2 11/12/2019   He is on thyroid medication. His medication was not changed last visit.   Lab Results  Component Value Date   TSH 2.12 11/12/2019  .  He has a history of testosterone deficiency and is on testosterone replacement. He states that the testosterone helps with his energy, libido, muscle mass. Denies symptoms of elevated estrogen or dihydrotestosterone.  Lab Results  Component Value Date   TESTOSTERONE 2,070 (H) 11/12/2019    Patient is on Vitamin D supplement.   Lab Results  Component Value Date   VD25OH 46 11/12/2019     Last PSA was: Lab Results  Component Value Date   PSA 0.21 11/12/2015   Patient is on allopurinol for gout and does not report a recent flare.   Current Medications:   Current Outpatient Medications (Endocrine & Metabolic):  Marland Kitchen  Insulin Lispro Prot & Lispro (HUMALOG MIX 75/25 KWIKPEN) (75-25) 100 UNIT/ML Kwikpen, INJECT SUBCUTANEOUSLY 15  UNITS WITH BREAKFAST AND 10 UNITS WITH DINNER .  levothyroxine (SYNTHROID) 300 MCG tablet, Take 1 tablet daily on an empty stomach with only water for 30 minutes & no Antacid meds, Calcium or Magnesium for 4 hours & avoid Biotin .  metFORMIN (GLUCOPHAGE-XR) 500 MG 24 hr tablet, TAKE 1 TABLET WITH  BREAKFAST AND LUNCH, AND 2  TABLETS WITH SUPPER FOR  DIABETES .  testosterone cypionate (DEPOTESTOSTERONE CYPIONATE) 200 MG/ML injection, INJECT 1 MILLILITERS AS DIRECTED INTRAMUSCULARLY EVERY 14 DAYS  Current Outpatient Medications (Cardiovascular):  .  lisinopril (ZESTRIL) 20 MG tablet, Take 1 tablet daily for Diabetic Kidney protection .  rosuvastatin (CRESTOR) 20 MG tablet, Take 1  tablet daily for Cholesterol   Current Outpatient Medications (Analgesics):  .  allopurinol (ZYLOPRIM) 100 MG tablet, Take 1 tablet daily to prevent gout. Marland Kitchen  ibuprofen (ADVIL,MOTRIN) 200 MG tablet, Take 400 mg by mouth every 6 (six) hours as needed for headache or mild pain.   Current Outpatient Medications (Other):  Marland Kitchen  BD PEN NEEDLE NANO U/F 32G X 4 MM MISC, USE TWICE A DAY AS DIRECTED .  Cholecalciferol (VITAMIN D PO), Take 4,000 Int'l Units by mouth daily. Marland Kitchen  CINNAMON PO, Take 1,000 mg by mouth. .  Continuous Blood Gluc Sensor (FREESTYLE LIBRE 14 DAY SENSOR) MISC, Apply new sensor every 14 days. DX-E10.9.Check blood sugar 3 times a day. .  Continuous Blood Gluc Sensor MISC, 1 each by Does not apply route as directed. Check blood sugar  3 times a day Dx:E10.9 .  glucose blood test strip, Check blood sugar 3 times a day-DX-E13.9. .  NEEDLE, DISP, 21 G (BD ECLIPSE NEEDLE) 21G X 1" MISC, Use to inject 2 mL intramuscularly every 14 days. .  SYRINGE-NEEDLE, DISP, 3 ML (BD ECLIPSE SYRINGE) 21G X 1" 3 ML MISC, Use to inject 2 mL intramuscularly every 14 days  Allergies:  Allergies  Allergen Reactions  . Shellfish Allergy Anaphylaxis and Rash  . Atorvastatin Other (See Comments)    Myalgias   Health Maintenance:  Immunization History  Administered Date(s) Administered  . Influenza Split 06/12/2013  . Influenza Whole 06/30/2009  . Influenza-Unspecified 06/25/2017, 06/26/2019  . PPD Test 10/29/2013, 10/30/2014, 11/12/2015  . Td 01/08/2004  . Tdap 10/29/2013   Health Maintenance  Topic Date Due  . PNEUMOCOCCAL POLYSACCHARIDE VACCINE AGE 52-64 HIGH RISK  Never done  . OPHTHALMOLOGY EXAM  Never done  . COVID-19 Vaccine (1) Never done  . FOOT EXAM  05/24/2017  . INFLUENZA VACCINE  03/30/2020  . HEMOGLOBIN A1C  05/14/2020  . TETANUS/TDAP  10/30/2023  . COLONOSCOPY  10/27/2025  . Hepatitis C Screening  Completed  . HIV Screening  Completed   DEXA:N/A Colonoscopy: 2017 Dr. Earlean Shawl  repeat 10 years EGD:  Patient Care Team: [name], MD as PCP - General (Internal Medicine)  Medical History:  has Hypothyroidism; Erectile dysfunction associated with type 2 diabetes mellitus ([place]); RENAL CYST; OSA on CPAP; NEPHROLITHIASIS, HX OF; Anxiety; Testosterone deficiency; Mixed diabetic hyperlipidemia associated with type 1 diabetes mellitus ([place]); Hypertension; Medication management; Vitamin D deficiency; and LADA (latent autoimmune diabetes in adults), managed as type 1 ([place]) on their problem list. Surgical History:  He  has a past surgical history that includes Thyroidectomy (2001); Cystoscopy/retrograde/ureteroscopy/stone extraction with basket (Right, 01/19/2016); and Cystoscopy with holmium laser lithotripsy (Right, 01/19/2016). Family History:  His family history includes Diabetes in his father and sister; Hypertension in his father. Social History:   reports that he quit smoking about 6 years ago. His smoking use included cigars. He has never used smokeless tobacco. He reports current alcohol use of about 14.0 standard drinks of alcohol per week. He reports that he does not use drugs. Review of Systems:  Review of Systems  Constitutional: Negative.  Negative for chills, malaise/fatigue and weight loss.  HENT: Negative.   Eyes: Negative.   Respiratory: Positive for cough. Negative for hemoptysis, sputum production, shortness of breath and wheezing.   Cardiovascular: Negative.  Negative for chest pain and leg swelling.  Gastrointestinal: Negative.   Genitourinary: Negative.   Musculoskeletal: Positive for myalgias. Negative for back pain, falls, joint pain and neck pain.  Skin: Negative.     Physical Exam: Estimated body mass index is 34.06 kg/m as calculated from the following:   Height as of this encounter: 5' 9.5" (1.765 m).   Weight as of this encounter: 234 lb (106.1 kg). BP 124/72   Pulse 60   Temp 97.7 F (36.5 C)   Ht 5' 9.5" (1.765 m)   Wt 234 lb  (106.1 kg)   SpO2 97%   BMI 34.06 kg/m  General Appearance: Well nourished, in no apparent distress.  Eyes: PERRLA, EOMs, conjunctiva no swelling or erythema, normal fundi and vessels.  Sinuses: No Frontal/maxillary tenderness  ENT/Mouth: Ext aud canals clear, normal light reflex with TMs without erythema, bulging. Good dentition. No erythema, swelling, or exudate on post pharynx. Tonsils not swollen or erythematous. Hearing normal.  Neck: Supple, thyroid normal. No bruits  Respiratory: Respiratory effort normal, BS equal bilaterally without rales, rhonchi, wheezing or stridor.  Cardio: RRR without murmurs, rubs or gallops. Brisk peripheral pulses without edema.  Chest: symmetric, with normal excursions and percussion.  Abdomen: Soft, nontender, no guarding, rebound, hernias, masses, or organomegaly.  Lymphatics: Non tender without lymphadenopathy.  Genitourinary:  Musculoskeletal: Full ROM all peripheral extremities,5/5 strength, and normal gait. + varicose veins bilateral legs, no erythema, hard cord, neg homen's Skin: Warm, dry without rashes, lesions, ecchymosis. Neuro: Cranial nerves intact, reflexes equal bilaterally. Normal muscle tone, no cerebellar symptoms. Sensation intact.  Psych: Awake and oriented X 3, normal affect, Insight and Judgment appropriate.   EKG: WNL no changes. AORTA SCAN: WNL  Vicie Mutters 3:10 PM [facility]

## 2020-02-14 NOTE — Patient Instructions (Addendum)
Stop the lisinopril may cause cough Start he olmestartan 1/2 pill a day or 10 mg and monitor BP Can increase to 20 mg or 1 pill if BP is over 130/80  INFORMATION ABOUT YOUR XRAY [facility] Can walk into [address] building for an Insurance account manager. They will have the order and take you back. You do not any paper work, I should get the result back today or tomorrow. This order is good for a year.  Can call [phone] to schedule an appointment if you wish.   VARICOSE VEINS Varicose veins are veins that have become enlarged and twisted. CAUSES This condition is the result of valves in the veins not working properly. Valves in the veins help return blood from the leg to the heart. When your calf muscles squeeze, the blood moves up your leg then the valves close and this continues until the blood gets back to your heart.  If these valves are damaged, blood flows backwards and backs up into the veins in the leg near the skin OR if your are sitting/standing for a long time without using your calf muscles the blood will back up into the veins in your legs. This causes the veins to become larger. People who are on their feet a lot, sit a lot without walking (like on a plane, at a desk, or in a car), who are pregnant, or who are overweight are more likely to develop varicose veins. SYMPTOMS   Bulging, twisted-appearing, bluish veins, most commonly found on the legs.  Leg pain or a feeling of heaviness. These symptoms may be worse at the end of the day.  Leg swelling.  Skin color changes. DIAGNOSIS  Varicose veins can usually be diagnosed with an exam of your legs by your caregiver. He or she may recommend an ultrasound of your leg veins. TREATMENT  Most varicose veins can be treated at home. However, other treatments are available for people who have persistent symptoms or who want to treat the cosmetic appearance of the varicose veins. But this is only cosmetic and they will return if not properly  treated. These include:  Laser treatment of very small varicose veins.  Medicine that is shot (injected) into the vein. This medicine hardens the walls of the vein and closes off the vein. This treatment is called sclerotherapy. Afterwards, you may need to wear clothing or bandages that apply pressure.  Surgery. HOME CARE INSTRUCTIONS   Do not stand or sit in one position for long periods of time. Do not sit with your legs crossed. Rest with your legs raised during the day.  Your legs have to be higher than your heart so that gravity will force the valves to open, so please really elevate your legs.   Wear elastic stockings or support hose. Do not wear other tight, encircling garments around the legs, pelvis, or waist.  [employer]  has a wide variety of well priced compression stockings. [place], [place] [address] [phone]  OR THERE ARE COPPER INFUSED COMPRESSION SOCKS AT [facility] OR [facility]  [employer] also has great cheap/afforable stockings or socks- the socks are easier to get on your feet  - can also get a jacob's donner that helps you put on the sock  Walk as much as possible to increase blood flow.  Raise the foot of your bed at night with 2-inch blocks.  If you get a cut in the skin over the vein and the vein bleeds, lie  down with your leg raised and press on it with a clean cloth until the bleeding stops. Then place a bandage (dressing) on the cut. See your caregiver if it continues to bleed or needs stitches. SEEK MEDICAL CARE IF:   The skin around your ankle starts to break down.  You have pain, redness, tenderness, or hard swelling developing in your leg over a vein.  You are uncomfortable due to leg pain. Document Released: 05/26/2005 Document Revised: 11/08/2011 Document Reviewed: 10/12/2010 [facility] Patient Information 2014 [place].  HYPERTENSION INFORMATION  Monitor your blood pressure at home, please keep a record and bring that in  with you to your next office visit.   Go to the ER if any CP, SOB, nausea, dizziness, severe HA, changes vision/speech  Testing/Procedures: HOW TO TAKE YOUR BLOOD PRESSURE:  Rest 5 minutes before taking your blood pressure.  Don't smoke or drink caffeinated beverages for at least 30 minutes before.  Take your blood pressure before (not after) you eat.  Sit comfortably with your back supported and both feet on the floor (don't cross your legs).  Elevate your arm to heart level on a table or a desk.  Use the proper sized cuff. It should fit smoothly and snugly around your bare upper arm. There should be enough room to slip a fingertip under the cuff. The bottom edge of the cuff should be 1 inch above the crease of the elbow.  Due to a recent study, SPRINT, we have changed our goal for the systolic or top blood pressure number. Ideally we want your top number at 120.  In the [facility] Trial, 5000 people were randomized to a goal BP of 120 and 5000 people were randomized to a goal BP of less than 140. The patients with the goal BP at 120 had LESS DEMENTIA, LESS HEART ATTACKS, AND LESS STROKES, AS WELL AS OVERALL DECREASED MORTALITY OR DEATH RATE.   There was another study that showed taking your blood pressure medications at night decrease cardiovascular events.  However if you are on a fluid pill, please take this in the morning.   If you are willing, our goal BP is the top number of 120.  Your most recent BP: BP: 124/72   Take your medications faithfully as instructed. Maintain a healthy weight. Get at least 150 minutes of aerobic exercise per week. Minimize salt intake. Minimize alcohol intake  DASH Eating Plan DASH stands for "Dietary Approaches to Stop Hypertension." The DASH eating plan is a healthy eating plan that has been shown to reduce high blood pressure (hypertension). Additional health benefits may include reducing the risk of type 2 diabetes mellitus, heart disease, and  stroke. The DASH eating plan may also help with weight loss. WHAT DO I NEED TO KNOW ABOUT THE DASH EATING PLAN? For the DASH eating plan, you will follow these general guidelines:  Choose foods with a percent daily value for sodium of less than 5% (as listed on the food label).  Use salt-free seasonings or herbs instead of table salt or sea salt.  Check with your health care provider or pharmacist before using salt substitutes.  Eat lower-sodium products, often labeled as "lower sodium" or "no salt added."  Eat fresh foods.  Eat more vegetables, fruits, and low-fat dairy products.  Choose whole grains. Look for the word "whole" as the first word in the ingredient list.  Choose fish and skinless chicken or [place] more often than red meat. Limit fish, poultry, and meat  to 6 oz (170 g) each day.  Limit sweets, desserts, sugars, and sugary drinks.  Choose heart-healthy fats.  Limit cheese to 1 oz (28 g) per day.  Eat more home-cooked food and less restaurant, buffet, and fast food.  Limit fried foods.  Cook foods using methods other than frying.  Limit canned vegetables. If you do use them, rinse them well to decrease the sodium.  When eating at a restaurant, ask that your food be prepared with less salt, or no salt if possible. WHAT FOODS CAN I EAT? Seek help from a dietitian for individual calorie needs. Grains Whole grain or whole wheat bread. Brown rice. Whole grain or whole wheat pasta. Quinoa, bulgur, and whole grain cereals. Low-sodium cereals. Corn or whole wheat flour tortillas. Whole grain cornbread. Whole grain crackers. Low-sodium crackers. Vegetables Fresh or frozen vegetables (raw, steamed, roasted, or grilled). Low-sodium or reduced-sodium tomato and vegetable juices. Low-sodium or reduced-sodium tomato sauce and paste. Low-sodium or reduced-sodium canned vegetables.  Fruits All fresh, canned (in natural juice), or frozen fruits. Meat and Other Protein  Products Ground beef (85% or leaner), grass-fed beef, or beef trimmed of fat. Skinless chicken or [place]. Ground chicken or [place]. Pork trimmed of fat. All fish and seafood. Eggs. Dried beans, peas, or lentils. Unsalted nuts and seeds. Unsalted canned beans. Dairy Low-fat dairy products, such as skim or 1% milk, 2% or reduced-fat cheeses, low-fat ricotta or cottage cheese, or plain low-fat yogurt. Low-sodium or reduced-sodium cheeses. Fats and Oils Tub margarines without trans fats. Light or reduced-fat mayonnaise and salad dressings (reduced sodium). Avocado. Safflower, olive, or canola oils. Natural peanut or almond butter. Other Unsalted popcorn and pretzels. The items listed above may not be a complete list of recommended foods or beverages. Contact your dietitian for more options. WHAT FOODS ARE NOT RECOMMENDED? Grains White bread. White pasta. White rice. Refined cornbread. Bagels and croissants. Crackers that contain trans fat. Vegetables Creamed or fried vegetables. Vegetables in a cheese sauce. Regular canned vegetables. Regular canned tomato sauce and paste. Regular tomato and vegetable juices. Fruits Dried fruits. Canned fruit in light or heavy syrup. Fruit juice. Meat and Other Protein Products Fatty cuts of meat. Ribs, chicken wings, bacon, sausage, bologna, salami, chitterlings, fatback, hot dogs, bratwurst, and packaged luncheon meats. Salted nuts and seeds. Canned beans with salt. Dairy Whole or 2% milk, cream, half-and-half, and cream cheese. Whole-fat or sweetened yogurt. Full-fat cheeses or blue cheese. Nondairy creamers and whipped toppings. Processed cheese, cheese spreads, or cheese curds. Condiments Onion and garlic salt, seasoned salt, table salt, and sea salt. Canned and packaged gravies. Worcestershire sauce. Tartar sauce. Barbecue sauce. Teriyaki sauce. Soy sauce, including reduced sodium. Steak sauce. Fish sauce. Oyster sauce. Cocktail sauce. Horseradish. Ketchup and  mustard. Meat flavorings and tenderizers. Bouillon cubes. Hot sauce. Tabasco sauce. Marinades. Taco seasonings. Relishes. Fats and Oils Butter, stick margarine, lard, shortening, ghee, and bacon fat. Coconut, palm kernel, or palm oils. Regular salad dressings. Other Pickles and olives. Salted popcorn and pretzels. The items listed above may not be a complete list of foods and beverages to avoid. Contact your dietitian for more information. WHERE CAN I FIND MORE INFORMATION? National Heart, Lung, and Blood Institute: travelstabloid.com Document Released: 08/05/2011 Document Revised: 12/31/2013 Document Reviewed: 06/20/2013 [facility] Patient Information 2015 [place], [place]. This information is not intended to replace advice given to you by your health care provider. Make sure you discuss any questions you have with your health care provider.   Here is some information  about the vaccines. The data is very good and this information hopefully answers a lot of your questions and give you a confidence boost.   The Pfizer and Moderna vaccines are messenger RNA vaccines. That technology is not new, it has been studied for 20 years at least, used for cancer and MS treatment. They had started using the vaccine for MERS AND SARS (both a different coronavirus) 10 years ago but never finished so we had a good backbone for this vaccine.   There were no short cuts with the techniques for these clinical  trials, just lots of willing participants quickly and lots of up front money helped speed up the process.   The mRNA is very fragile which is why it needs to be kept so cold and thawed a certain way, think of it as a message in a glass bottle. NO PART of the virus is in this vaccine, it is a clip of the genetic sequence. This mRNA is injected in your arm, connects with a ribosome, delivers the message and the degrades.  That is part of the cause of the sore arm, the mRNA never  leaves your arm. It degrades there. The mRNA does not go into our nucleotide where our DNA is, and we would need a DNA reverse transcriptase to take RNA to DNA, we do not have this, it can not change our DNA. The ribosome that got the message creates a protein and that protein circulates in our body and we have an immune reaction to that creating antibodies. Any time our immune system is triggered, inflammation is triggered too so you can have a temp, muscle aches, etc. Normal reaction.  I have seen so many patients that just had mild COVID in the office weeks later still have issues. We are still learning about post COVID syndrome, the CDC should be coming out for guidelines for practioners soon. There is too much unknown about COVID. We have been using vaccines for over 100 years or more, i Personnel officer. Ask your parents or any older friends about polio and that vaccine, that was a disease shutting down schools,had kids in iron lung, devastating young kids and families. People lined up for that vaccine and technology has only improved.   Please get the vaccine. If you have any further questions please make an appointment in the office to discuss further.  Estill Bamberg

## 2020-02-15 LAB — COMPLETE METABOLIC PANEL WITH GFR
AG Ratio: 1.8 (calc) (ref 1.0–2.5)
ALT: 18 U/L (ref 9–46)
AST: 16 U/L (ref 10–35)
Albumin: 4.5 g/dL (ref 3.6–5.1)
Alkaline phosphatase (APISO): 55 U/L (ref 35–144)
BUN: 18 mg/dL (ref 7–25)
CO2: 28 mmol/L (ref 20–32)
Calcium: 9.8 mg/dL (ref 8.6–10.3)
Chloride: 103 mmol/L (ref 98–110)
Creat: 1.05 mg/dL (ref 0.70–1.33)
GFR, Est African American: 90 mL/min/{1.73_m2} (ref >=60)
GFR, Est Non African American: 77 mL/min/{1.73_m2} (ref >=60)
Globulin: 2.5 g/dL (calc) (ref 1.9–3.7)
Glucose, Bld: 138 mg/dL — ABNORMAL HIGH (ref 65–99)
Potassium: 4.5 mmol/L (ref 3.5–5.3)
Sodium: 137 mmol/L (ref 135–146)
Total Bilirubin: 0.6 mg/dL (ref 0.2–1.2)
Total Protein: 7 g/dL (ref 6.1–8.1)

## 2020-02-15 LAB — CBC WITH DIFFERENTIAL/PLATELET
Absolute Monocytes: 698 cells/uL (ref 200–950)
Basophils Absolute: 70 cells/uL (ref 0–200)
Basophils Relative: 1.1 %
Eosinophils Absolute: 90 cells/uL (ref 15–500)
Eosinophils Relative: 1.4 %
HCT: 42.2 % (ref 38.5–50.0)
Hemoglobin: 14.4 g/dL (ref 13.2–17.1)
Lymphs Abs: 1370 cells/uL (ref 850–3900)
MCH: 32.7 pg (ref 27.0–33.0)
MCHC: 34.1 g/dL (ref 32.0–36.0)
MCV: 95.7 fL (ref 80.0–100.0)
MPV: 9.7 fL (ref 7.5–12.5)
Monocytes Relative: 10.9 %
Neutro Abs: 4173 cells/uL (ref 1500–7800)
Neutrophils Relative %: 65.2 %
Platelets: 262 10*3/uL (ref 140–400)
RBC: 4.41 10*6/uL (ref 4.20–5.80)
RDW: 12.2 % (ref 11.0–15.0)
Total Lymphocyte: 21.4 %
WBC: 6.4 10*3/uL (ref 3.8–10.8)

## 2020-02-15 LAB — URINALYSIS, ROUTINE W REFLEX MICROSCOPIC
Bilirubin Urine: NEGATIVE
Glucose, UA: NEGATIVE
Hgb urine dipstick: NEGATIVE
Ketones, ur: NEGATIVE
Leukocytes,Ua: NEGATIVE
Nitrite: NEGATIVE
Protein, ur: NEGATIVE
Specific Gravity, Urine: 1.012 (ref 1.001–1.03)
pH: 7 (ref 5.0–8.0)

## 2020-02-15 LAB — MICROALBUMIN / CREATININE URINE RATIO
Creatinine, Urine: 70 mg/dL (ref 20–320)
Microalb Creat Ratio: 4 mcg/mg creat (ref <30)
Microalb, Ur: 0.3 mg/dL

## 2020-02-15 LAB — IRON, TOTAL/TOTAL IRON BINDING CAP
%SAT: 26 % (calc) (ref 20–48)
Iron: 90 ug/dL (ref 50–180)
TIBC: 347 mcg/dL (calc) (ref 250–425)

## 2020-02-15 LAB — TESTOSTERONE: Testosterone: 239 ng/dL — ABNORMAL LOW (ref 250–827)

## 2020-02-15 LAB — HEMOGLOBIN A1C
Hgb A1c MFr Bld: 7.3 % of total Hgb — ABNORMAL HIGH (ref <5.7)
Mean Plasma Glucose: 163 (calc)
eAG (mmol/L): 9 (calc)

## 2020-02-15 LAB — LIPID PANEL
Cholesterol: 108 mg/dL (ref <200)
HDL: 43 mg/dL (ref >=40)
LDL Cholesterol (Calc): 48 mg/dL (calc)
Non-HDL Cholesterol (Calc): 65 mg/dL (calc) (ref <130)
Total CHOL/HDL Ratio: 2.5 (calc) (ref <5.0)
Triglycerides: 83 mg/dL (ref <150)

## 2020-02-15 LAB — TSH: TSH: 1.45 mIU/L (ref 0.40–4.50)

## 2020-02-15 LAB — VITAMIN D 25 HYDROXY (VIT D DEFICIENCY, FRACTURES): Vit D, 25-Hydroxy: 50 ng/mL (ref 30–100)

## 2020-02-15 LAB — MAGNESIUM: Magnesium: 2 mg/dL (ref 1.5–2.5)

## 2020-02-15 LAB — PSA: PSA: 0.2 ng/mL (ref <=4.0)

## 2020-02-15 LAB — VITAMIN B12: Vitamin B-12: 447 pg/mL (ref 200–1100)

## 2020-03-04 ENCOUNTER — Other Ambulatory Visit: Payer: Self-pay | Admitting: Internal Medicine

## 2020-03-10 ENCOUNTER — Ambulatory Visit
Admission: RE | Admit: 2020-03-10 | Discharge: 2020-03-10 | Disposition: A | Discharge status: Home / Self Care | Payer: 59 | Source: Ambulatory Visit | Attending: Physician Assistant | Admitting: Physician Assistant

## 2020-03-10 DIAGNOSIS — R059 Cough, unspecified: Secondary | ICD-10-CM

## 2020-03-12 NOTE — Addendum Note (Signed)
Addended by: Vicie Mutters R on: 03/12/2020 08:34 AM   Modules accepted: Orders

## 2020-03-12 NOTE — Addendum Note (Signed)
Addended by: Vicie Mutters R on: 03/12/2020 01:54 PM   Modules accepted: Orders

## 2020-03-13 ENCOUNTER — Encounter: Payer: Self-pay | Admitting: Gastroenterology

## 2020-03-18 ENCOUNTER — Telehealth: Payer: Self-pay | Admitting: Physician Assistant

## 2020-03-18 NOTE — Telephone Encounter (Signed)
-----   Message from Daniel Case, [place] sent at 03/12/2020  1:54 PM EDT ----- Patient states he has had trouble with pills, trouble with foods.  S/p thyroidectomy.  Will have to drink a lot of water before it goes down.  He has constant burning in chest now. No leg pain, leg swelling, redness in legs.  No palpitations, no SOB, no chest pain.   Still has cough while on the ARB.   Will refer to GI for EGD/swallow study to rule out stenosis, GERD, oralpharngeal issues due to possible aspiration.   Will get CT chest.

## 2020-03-18 NOTE — Telephone Encounter (Signed)
Per Vicie Mutters called patient to  advise CT Chest was declined by insurance.  Appointment at [facility] canceled. Advised patient of appointment with [facility] and mailed scheduled appointment details. Patient understands outcome and appreciates appointment information.

## 2020-03-27 ENCOUNTER — Other Ambulatory Visit: Payer: 59

## 2020-04-03 ENCOUNTER — Other Ambulatory Visit: Payer: 59

## 2020-04-16 ENCOUNTER — Encounter: Payer: Self-pay | Admitting: Gastroenterology

## 2020-04-16 ENCOUNTER — Ambulatory Visit (INDEPENDENT_AMBULATORY_CARE_PROVIDER_SITE_OTHER): Payer: 59 | Admitting: Gastroenterology

## 2020-04-16 ENCOUNTER — Other Ambulatory Visit: Payer: Self-pay | Admitting: Internal Medicine

## 2020-04-16 VITALS — BP 143/82 | HR 66 | Ht 77.0 in | Wt 227.8 lb

## 2020-04-16 DIAGNOSIS — Z8639 Personal history of other endocrine, nutritional and metabolic disease: Secondary | ICD-10-CM | POA: Diagnosis not present

## 2020-04-16 DIAGNOSIS — R0789 Other chest pain: Secondary | ICD-10-CM

## 2020-04-16 DIAGNOSIS — R1310 Dysphagia, unspecified: Secondary | ICD-10-CM | POA: Diagnosis not present

## 2020-04-16 MED ORDER — PANTOPRAZOLE SODIUM 40 MG PO TBEC
40.0000 mg | DELAYED_RELEASE_TABLET | Freq: Every day | ORAL | 2 refills | Status: DC
Start: 2020-04-16 — End: 2020-06-13

## 2020-04-16 NOTE — Progress Notes (Signed)
04/16/2020 Daniel Case [id number] August 15, 1961   HISTORY OF PRESENT ILLNESS: This is a 59 year old male who is new to our office.  He was referred here by Vicie Mutters, PA-C, for evaluation regarding dysphagia.  He is here today with his wife and they were under the impression he was getting an endoscopy today.  He says that he took the whole day off of work and had not eaten or taken his insulin.  They tell me that he has had this aching, burning, right-sided chest pain for the past 6 to 8 months.  He says during this time he has also noted an increase in phlegm and persistent, productive cough.  He denies any classic heartburn or reflux type symptoms.  He says that dating back to when he has thyroidectomy he has experienced trouble swallowing pills where they sometimes feel like they get stuck and he has to drink a lot of water to help him go down.  He says it happens with food once in a while as well.  He has mostly contributed that to what he felt was scar tissue from his previous surgery.  He is not on any acid reflux type medication.  He had a colonoscopy in February 2017 by Dr. Earlean Shawl.  He had 1 polyp removed and internal hemorrhoids.   Past Medical History:  Diagnosis Date  . Anxiety   . Cancer ([place])    thyroid  . Complication of anesthesia 2001   panic attack when going under anesthesia  . Diabetes mellitus without complication ([place])    type 2  . Heart murmur    as a teen  . Hyperlipidemia   . Hypothyroidism   . Kidney stones   . Other testicular hypofunction   . PONV (postoperative nausea and vomiting)   . Sleep apnea    Past Surgical History:  Procedure Laterality Date  . CYSTOSCOPY WITH HOLMIUM LASER LITHOTRIPSY Right 01/19/2016   Procedure: CYSTOSCOPY WITH HOLMIUM LASER LITHOTRIPSY;  Surgeon: Carolan Clines, MD;  Location: [facility];  Service: Urology;  Laterality: Right;  . CYSTOSCOPY/RETROGRADE/URETEROSCOPY/STONE EXTRACTION WITH BASKET Right 01/19/2016    Procedure: CYSTO/RIGHT RETROGRADE PYELOGRAM/URETEROSCOPY/STONE EXTRACTION WITH BASKET AND STENT PLACEMENT;  Surgeon: Carolan Clines, MD;  Location: [facility];  Service: Urology;  Laterality: Right;  . THYROIDECTOMY  2001    reports that he quit smoking about 6 years ago. His smoking use included cigars. He has never used smokeless tobacco. He reports current alcohol use of about 14.0 standard drinks of alcohol per week. He reports that he does not use drugs. family history includes Diabetes in his father and sister; Hypertension in his father. Allergies  Allergen Reactions  . Shellfish Allergy Anaphylaxis and Rash  . Atorvastatin Other (See Comments)    Myalgias      Outpatient Encounter Medications as of 04/16/2020  Medication Sig  . allopurinol (ZYLOPRIM) 100 MG tablet Take 1 tablet daily to prevent gout.  . BD PEN NEEDLE NANO U/F 32G X 4 MM MISC USE TWICE A DAY AS DIRECTED  . Cholecalciferol (VITAMIN D PO) Take 4,000 Int'l Units by mouth daily.  Marland Kitchen CINNAMON PO Take 1,000 mg by mouth.  . Continuous Blood Gluc Sensor (FREESTYLE LIBRE 14 DAY SENSOR) MISC Apply new sensor every 14 days. DX-E10.9.Check blood sugar 3 times a day.  . Continuous Blood Gluc Sensor MISC 1 each by Does not apply route as directed. Check blood sugar 3 times a day Dx:E10.9  . glucose blood test strip Check blood  sugar 3 times a day-DX-E13.9.  . Insulin Lispro Prot & Lispro (HUMALOG MIX 75/25 KWIKPEN) (75-25) 100 UNIT/ML Kwikpen INJECT SUBCUTANEOUSLY 20  UNITS WITH FIRST MEAL OF  THE DAY AND 10 UNITS WITH  LARGEST MEAL OF THE DAY  PRIOR TO AT BEDTIME  . levothyroxine (SYNTHROID) 300 MCG tablet Take 1 tablet daily on an empty stomach with only water for 30 minutes & no Antacid meds, Calcium or Magnesium for 4 hours & avoid Biotin  . metFORMIN (GLUCOPHAGE-XR) 500 MG 24 hr tablet TAKE 1 TABLET WITH  BREAKFAST AND LUNCH, AND 2  TABLETS WITH SUPPER FOR  DIABETES  . NEEDLE, DISP, 21 G (BD ECLIPSE NEEDLE) 21G X 1" MISC Use to  inject 2 mL intramuscularly every 14 days.  Marland Kitchen olmesartan (BENICAR) 20 MG tablet Take 1 tablet (20 mg total) by mouth daily.  . rosuvastatin (CRESTOR) 20 MG tablet Take 1 tablet daily for Cholesterol  . SYRINGE-NEEDLE, DISP, 3 ML (BD ECLIPSE SYRINGE) 21G X 1" 3 ML MISC Use to inject 2 mL intramuscularly every 14 days  . testosterone cypionate (DEPOTESTOSTERONE CYPIONATE) 200 MG/ML injection INJECT 1 MILLILITERS AS DIRECTED INTRAMUSCULARLY EVERY 14 DAYS  . [DISCONTINUED] ibuprofen (ADVIL,MOTRIN) 200 MG tablet Take 400 mg by mouth every 6 (six) hours as needed for headache or mild pain. (Patient not taking: Reported on 04/16/2020)   No facility-administered encounter medications on file as of 04/16/2020.     REVIEW OF SYSTEMS  : All other systems reviewed and negative except where noted in the History of Present Illness.   PHYSICAL EXAM: Ht 6\' 5"  (1.956 m)   Wt 227 lb 12.8 oz (103.3 kg)   BMI 27.01 kg/m  General: Well developed white male in no acute distress Head: Normocephalic and atraumatic Eyes:  Sclerae anicteric, conjunctiva pink. Ears: Normal auditory acuity Lungs: Clear throughout to auscultation; no W/R/R. Heart: Regular rate and rhythm; no M/R/G. Abdomen: Soft, non-distended.  BS present.  Non-tender. Musculoskeletal: Symmetrical with no gross deformities  Skin: No lesions on visible extremities Extremities: No edema  Neurological: Alert oriented x 4, grossly non-focal Psychological:  Alert and cooperative. Normal mood and affect  ASSESSMENT AND PLAN: *59 year old male with atypical/burning right-sided chest pain with associated productive cough/phlegm and dysphagia to pills and very intermittently to food.  He does not feel like he has classic heartburn/reflux symptoms.  He and his wife were under the impression that he was getting an endoscopy today.  I discussed with Dr. Ardis Hughs and he is certainly willing to place him on the schedule for an EGD in 4 to 6 weeks to take a look  and perform esophageal dilation if needed, but he would like him to be placed on PPI therapy once daily in the interim to see how much, if any, of his symptoms improve with that regimen.  Will plan for EGD with Dr. Ardis Hughs.  The risks, benefits, and alternatives to EGD with dilation were discussed with the patient and he consents to proceed.  Prescription for pantoprazole 40 mg once daily was sent to his pharmacy. *IDDM:  Insulin will be adjusted prior to endoscopic procedure per protocol. Will resume normal dosing after procedure.   CC:  Vicie Mutters, PA-C

## 2020-04-16 NOTE — Patient Instructions (Signed)
We have sent the following medications to your pharmacy for you to pick up at your convenience:  Protonix  You have been scheduled for an endoscopy. Please follow written instructions given to you at your visit today. If you use inhalers (even only as needed), please bring them with you on the day of your procedure.

## 2020-04-17 NOTE — Progress Notes (Signed)
I agree with the above note, plan 

## 2020-05-08 ENCOUNTER — Other Ambulatory Visit: Payer: Self-pay | Admitting: Adult Health

## 2020-05-28 ENCOUNTER — Telehealth: Payer: Self-pay

## 2020-05-28 NOTE — Telephone Encounter (Signed)
Patient states that he is scheduled for a follow up with Caryl Pina on 10/01 but questioning if he should come or not due to him having a "scope" done on the 15th. Please advise if he still needs to be seen or not.

## 2020-05-29 NOTE — Telephone Encounter (Signed)
Patient has cancelled his appointment.

## 2020-05-30 ENCOUNTER — Ambulatory Visit: Payer: 59 | Admitting: Adult Health

## 2020-06-13 ENCOUNTER — Encounter: Payer: Self-pay | Admitting: Gastroenterology

## 2020-06-13 ENCOUNTER — Ambulatory Visit (AMBULATORY_SURGERY_CENTER): Payer: 59 | Admitting: Gastroenterology

## 2020-06-13 ENCOUNTER — Other Ambulatory Visit: Payer: Self-pay

## 2020-06-13 VITALS — BP 137/82 | HR 50 | Temp 98.4°F | Resp 11 | Ht 77.0 in | Wt 227.0 lb

## 2020-06-13 DIAGNOSIS — R12 Heartburn: Secondary | ICD-10-CM | POA: Diagnosis not present

## 2020-06-13 DIAGNOSIS — K2970 Gastritis, unspecified, without bleeding: Secondary | ICD-10-CM | POA: Diagnosis not present

## 2020-06-13 DIAGNOSIS — K2950 Unspecified chronic gastritis without bleeding: Secondary | ICD-10-CM | POA: Diagnosis not present

## 2020-06-13 DIAGNOSIS — R1310 Dysphagia, unspecified: Secondary | ICD-10-CM

## 2020-06-13 DIAGNOSIS — K319 Disease of stomach and duodenum, unspecified: Secondary | ICD-10-CM | POA: Diagnosis not present

## 2020-06-13 MED ORDER — SODIUM CHLORIDE 0.9 % IV SOLN: 500.0000 mL | Freq: Once | INTRAVENOUS | Status: DC

## 2020-06-13 MED ORDER — PANTOPRAZOLE SODIUM 40 MG PO TBEC: 40.0000 mg | DELAYED_RELEASE_TABLET | Freq: Every day | ORAL | 3 refills | Status: DC

## 2020-06-13 NOTE — Op Note (Signed)
[place] Patient Name: Daniel Case Procedure Date: 06/13/2020 9:50 AM MRN: [id number] Endoscopist: Milus Banister , MD Age: 59 Referring MD:  Date of Birth: 1960-12-03 Gender: Male Account #: [id number] Procedure:                Upper GI endoscopy Indications:              Dyspepsia, Dysphagia, Heartburn Medicines:                Monitored Anesthesia Care Procedure:                Pre-Anesthesia Assessment:                           - Prior to the procedure, a History and Physical                            was performed, and patient medications and                            allergies were reviewed. The patient's tolerance of                            previous anesthesia was also reviewed. The risks                            and benefits of the procedure and the sedation                            options and risks were discussed with the patient.                            All questions were answered, and informed consent                            was obtained. Prior Anticoagulants: The patient has                            taken no previous anticoagulant or antiplatelet                            agents. ASA Grade Assessment: II - A patient with                            mild systemic disease. After reviewing the risks                            and benefits, the patient was deemed in                            satisfactory condition to undergo the procedure.                           After obtaining informed consent, the endoscope was  passed under direct vision. Throughout the                            procedure, the patient's blood pressure, pulse, and                            oxygen saturations were monitored continuously. The                            Endoscope was introduced through the mouth, and                            advanced to the second part of duodenum. The upper                            GI endoscopy was  accomplished without difficulty.                            The patient tolerated the procedure well. Scope In: Scope Out: Findings:                 Mild inflammation characterized by erythema and                            friability was found in the gastric antrum.                            Biopsies were taken with a cold forceps for                            histology.                           The exam was otherwise without abnormality.                           Normal appearing esophagus was biopsied (distal and                            proximal in separate jars). Complications:            No immediate complications. Estimated blood loss:                            None. Estimated Blood Loss:     Estimated blood loss: none. Impression:               - Mild non-specific gastritis, biopsied to check                            for H. pylori.                           - The examination was otherwise normal. Biopsies                            taken  from espohagus to check for Eosinophilic                            Esophagitis. Recommendation:           - Patient has a contact number available for                            emergencies. The signs and symptoms of potential                            delayed complications were discussed with the                            patient. Return to normal activities tomorrow.                            Written discharge instructions were provided to the                            patient.                           - Resume previous diet.                           - Continue present medications. You are not taking                            your protonix (antiacid med) at the correct time in                            relation to meals. Please try taking it 20-30                            minutes before a meal for now.                           - Await pathology results. Milus Banister, MD 06/13/2020 10:09:39 AM This report has been  signed electronically.

## 2020-06-13 NOTE — Progress Notes (Signed)
Pt Drowsy. VSS. To PACU, report to RN. No anesthetic complications noted.  

## 2020-06-13 NOTE — Progress Notes (Signed)
Lidocaine 100mg IV given to blunt gag reflex 

## 2020-06-13 NOTE — Patient Instructions (Signed)
Read all of the handouts given to you by your recovery room nurse.  Please, take your protonix as ordered on an empty stomach.  YOU HAD AN ENDOSCOPIC PROCEDURE TODAY AT [facility]:   Refer to the procedure report that was given to you for any specific questions about what was found during the examination.  If the procedure report does not answer your questions, please call your gastroenterologist to clarify.  If you requested that your care partner not be given the details of your procedure findings, then the procedure report has been included in a sealed envelope for you to review at your convenience later.  YOU SHOULD EXPECT: Some feelings of bloating in the abdomen. Passage of more gas than usual.  Walking can help get rid of the air that was put into your GI tract during the procedure and reduce the bloating.  Please Note:  You might notice some irritation and congestion in your nose or some drainage.  This is from the oxygen used during your procedure.  There is no need for concern and it should clear up in a day or so.  SYMPTOMS TO REPORT IMMEDIATELY:   Following upper endoscopy (EGD)  Vomiting of blood or coffee ground material  New chest pain or pain under the shoulder blades  Painful or persistently difficult swallowing  New shortness of breath  Fever of 100F or higher  Black, tarry-looking stools  For urgent or emergent issues, a gastroenterologist can be reached at any hour by calling [phone]. Do not use MyChart messaging for urgent concerns.    DIET:  We do recommend a small meal at first, but then you may proceed to your regular diet.  Drink plenty of fluids but you should avoid alcoholic beverages for 24 hours.  ACTIVITY:  You should plan to take it easy for the rest of today and you should NOT DRIVE or use heavy machinery until tomorrow (because of the sedation medicines used during the test).    FOLLOW UP: Our staff will call the number listed on  your records 48-72 hours following your procedure to check on you and address any questions or concerns that you may have regarding the information given to you following your procedure. If we do not reach you, we will leave a message.  We will attempt to reach you two times.  During this call, we will ask if you have developed any symptoms of COVID 19. If you develop any symptoms (ie: fever, flu-like symptoms, shortness of breath, cough etc.) before then, please call [phone].  If you test positive for Covid 19 in the 2 weeks post procedure, please call and report this information to [place].    If any biopsies were taken you will be contacted by phone or by letter within the next 1-3 weeks.  Please call us at [phone] if you have not heard about the biopsies in 3 weeks.    SIGNATURES/CONFIDENTIALITY: You and/or your care partner have signed paperwork which will be entered into your electronic medical record.  These signatures attest to the fact that that the information above on your After Visit Summary has been reviewed and is understood.  Full responsibility of the confidentiality of this discharge information lies with you and/or your care-partner.

## 2020-06-13 NOTE — Progress Notes (Signed)
VS taken by C.W. 

## 2020-06-17 ENCOUNTER — Telehealth: Payer: Self-pay | Admitting: *Deleted

## 2020-06-17 NOTE — Telephone Encounter (Signed)
  Follow up Call-  Call back number 06/13/2020  Post procedure Call Back phone  # [phone]  Permission to leave phone message Yes  Some recent data might be hidden     Patient questions:  Do you have a fever, pain , or abdominal swelling? No. Pain Score  0 *  Have you tolerated food without any problems? Yes.    Have you been able to return to your normal activities? Yes.    Do you have any questions about your discharge instructions: Diet   No. Medications  No. Follow up visit  No.  Do you have questions or concerns about your Care? No.  Actions: * If pain score is 4 or above: 1. No action needed, pain <4.Have you developed a fever since your procedure? no  2.   Have you had an respiratory symptoms (SOB or cough) since your procedure? no  3.   Have you tested positive for COVID 19 since your procedure no  4.   Have you had any family members/close contacts diagnosed with the COVID 19 since your procedure? no   If yes to any of these questions please route to Joylene John, RN and Joella Prince, RN

## 2020-06-18 MED ORDER — PANTOPRAZOLE SODIUM 40 MG PO TBEC: 40.0000 mg | DELAYED_RELEASE_TABLET | Freq: Every day | ORAL | 3 refills | Status: DC

## 2020-06-18 NOTE — Addendum Note (Signed)
Addended by: Aleatha Borer on: 06/18/2020 01:32 PM   Modules accepted: Orders

## 2020-06-23 ENCOUNTER — Encounter: Payer: Self-pay | Admitting: Gastroenterology

## 2020-07-06 DIAGNOSIS — I700 Atherosclerosis of aorta: Secondary | ICD-10-CM | POA: Insufficient documentation

## 2020-07-06 NOTE — Progress Notes (Signed)
History of Present Illness:      This very nice 59 y.o.  MWM presents for 3 month follow up with HTN, HLD, T1_IDDM (LADA), Hypothyroidism and Vitamin D Deficiency. Patient has OSA on CPAP with improved restorative sleep. He also has Gout controled on his Allopurinol.        Patient also has 8-10 month hx/o query right sided chest pain and in August was referred to Dr Ardis Hughs who did EGD on Oct 15 which showed mild nonspecific gastritis. CXR done 03/10/2020 showed  ground glass Rt basilar opacities, but Insurance Co would not approve Chest CT scan. Patient denies any significant cough, congestion or dyspnea.        Patient is treated for HTN  (2013) & BP has been controlled at home. Today's BP is borderline high normal diastolic - 737/10. Patient has had no complaints of any cardiac type chest pain, palpitations, dyspnea / orthopnea / PND, dizziness, claudication, or dependent edema.       Hyperlipidemia is controlled with diet & meds. Patient denies myalgias or other med SE's. Last Lipids were   Lab Results  Component Value Date   CHOL 108 02/14/2020   HDL 43 02/14/2020   LDLCALC 48 02/14/2020   LDLDIRECT 127.3 10/01/2009   TRIG 83 02/14/2020   CHOLHDL 2.5 02/14/2020    Also, the patient has moderate Obesity (BMI 34+) has history of  Insulin Dependent LADA(Latent [employer] Diabetes Mellitus of Adults) and was initiated on insulin Mar 2017.   Patient has CKD2 (GFR 77) . He denies symptoms of reactive hypoglycemia, diabetic polys, paresthesias or visual blurring.  Last A1c was not at goal:  Lab Results  Component Value Date   HGBA1C 7.3 (H) 02/14/2020         In 2001,  Patient underwent Thyroidectomy for Thyroid Cancer and is on supressive /replacement therapy.  Patient also has hx/o Low Testosterone & is on replacement with improved sense of well-being.             Further, the patient also has history of Vitamin D Deficiency ("11" /2011) and supplements vitamin D without any  suspected side-effects. Last vitamin D was not at goal (70-1200):  Lab Results  Component Value Date   VD25OH 50 02/14/2020    Current Outpatient Medications on File Prior to Visit  Medication Sig  . allopurinol  100 MG tablet Take 1 tablet Daily to Prevent Gout  . VITAMIN D  Take 4,000 Int'l Units daily.  Marland Kitchen CINNAMON PO Take 1,000 mg   . HUMALOG MIX 75/25 KWIKPEN INJEC 15 u qam & 10 u q pm   . levothyroxine  300 MCG tablet Take 1 tablet x 5 days & 1/2 tab x 2 days   . metFORMIN -XR 500 MG  Takes 2 tabs 2 x /day   . olmesartan  20 MG tablet Take 1 tablet  daily.  . pantoprazole 40 MG tablet Take 1 tablet  daily.  . rosuvastatin  20 MG tablet Take 1 tablet daily for Cholesterol  . testosterone cypio 200 MG/ML inject INJECT 2 ML  IM EVERY 14  DAYS    Allergies  Allergen Reactions  . Shellfish Allergy Anaphylaxis and Rash  . Atorvastatin Other (See Comments)    Myalgias    PMHx:   Past Medical History:  Diagnosis Date  . Allergy   . Anxiety   . Cancer ([place])    thyroid  . Complication of anesthesia 2001  panic attack when going under anesthesia  . Diabetes mellitus without complication ([place])    type 2  . Heart murmur    as a teen  . Hyperlipidemia   . Hypothyroidism   . Kidney stones   . Other testicular hypofunction   . PONV (postoperative nausea and vomiting)   . Sleep apnea     Immunization History  Administered Date(s) Administered  . Influenza Split 06/12/2013  . Influenza Whole 06/30/2009  . Influenza-Unspecified 06/25/2017, 06/26/2019  . Moderna SARS-COVID-2 Vaccination 02/09/2020, 03/19/2020  . PPD Test 10/29/2013, 10/30/2014, 11/12/2015  . Td 01/08/2004  . Tdap 10/29/2013    Past Surgical History:  Procedure Laterality Date  . COLONOSCOPY    . CYSTOSCOPY WITH HOLMIUM LASER LITHOTRIPSY Right 01/19/2016   Procedure: CYSTOSCOPY WITH HOLMIUM LASER LITHOTRIPSY;  Surgeon: Carolan Clines, MD;  Location: [facility];  Service: Urology;  Laterality: Right;    . CYSTOSCOPY/RETROGRADE/URETEROSCOPY/STONE EXTRACTION WITH BASKET Right 01/19/2016   Procedure: CYSTO/RIGHT RETROGRADE PYELOGRAM/URETEROSCOPY/STONE EXTRACTION WITH BASKET AND STENT PLACEMENT;  Surgeon: Carolan Clines, MD;  Location: [facility];  Service: Urology;  Laterality: Right;  . THYROIDECTOMY  2001    FHx:    Reviewed / unchanged  SHx:    Reviewed / unchanged   Systems Review:  Constitutional: Denies fever, chills, wt changes, headaches, insomnia, fatigue, night sweats, change in appetite. Eyes: Denies redness, blurred vision, diplopia, discharge, itchy, watery eyes.  ENT: Denies discharge, congestion, post nasal drip, epistaxis, sore throat, earache, hearing loss, dental pain, tinnitus, vertigo, sinus pain, snoring.  CV: Denies chest pain, palpitations, irregular heartbeat, syncope, dyspnea, diaphoresis, orthopnea, PND, claudication or edema. Respiratory: denies cough, dyspnea, DOE, pleurisy, hoarseness, laryngitis, wheezing.  Gastrointestinal: Denies dysphagia, odynophagia, heartburn, reflux, water brash, abdominal pain or cramps, nausea, vomiting, bloating, diarrhea, constipation, hematemesis, melena, hematochezia  or hemorrhoids. Genitourinary: Denies dysuria, frequency, urgency, nocturia, hesitancy, discharge, hematuria or flank pain. Musculoskeletal: Denies arthralgias, myalgias, stiffness, jt. swelling, pain, limping or strain/sprain.  Skin: Denies pruritus, rash, hives, warts, acne, eczema or change in skin lesion(s). Neuro: No weakness, tremor, incoordination, spasms, paresthesia or pain. Psychiatric: Denies confusion, memory loss or sensory loss. Endo: Denies change in weight, skin or hair change.  Heme/Lymph: No excessive bleeding, bruising or enlarged lymph nodes.  Physical Exam  BP 130/90   Pulse 62   Temp (!) 97.5 F (36.4 C)   Resp 16   Ht 6\' 5"  (1.956 m)   Wt 232 lb 6.4 oz (105.4 kg)   SpO2 97%   BMI 27.56 kg/m   Appears  well nourished, well groomed  and  in no distress.  Eyes: PERRLA, EOMs, conjunctiva no swelling or erythema. Sinuses: No frontal/maxillary tenderness ENT/Mouth: EAC's clear, TM's nl w/o erythema, bulging. Nares clear w/o erythema, swelling, exudates. Oropharynx clear without erythema or exudates. Oral hygiene is good. Tongue normal, non obstructing. Hearing intact.  Neck: Supple. Thyroid not palpable. Car 2+/2+ without bruits, nodes or JVD. Chest: Respirations nl with BS clear & equal w/o rales, rhonchi, wheezing or stridor.  Cor: Heart sounds normal w/ regular rate and rhythm without sig. murmurs, gallops, clicks or rubs. Peripheral pulses normal and equal  without edema.  Abdomen: Soft & bowel sounds normal. Non-tender w/o guarding, rebound, hernias, masses or organomegaly.  Lymphatics: Unremarkable.  Musculoskeletal: Full ROM all peripheral extremities, joint stability, 5/5 strength and normal gait.  Skin: Warm, dry without exposed rashes, lesions or ecchymosis apparent.  Neuro: Cranial nerves intact, reflexes equal bilaterally. Sensory-motor testing grossly intact. Tendon reflexes grossly intact.  Pysch: Alert & oriented x 3.  Insight and judgement nl & appropriate. No ideations.  Assessment and Plan:  1. Essential hypertension  - Continue medication, monitor blood pressure at home.  - Continue DASH diet.  Reminder to go to the ER if any CP,  SOB, nausea, dizziness, severe HA, changes vision/speech.  - CBC with Differential/Platelet - COMPLETE METABOLIC PANEL WITH GFR - Magnesium - TSH  2. Hyperlipidemia due to type 1 diabetes mellitus ([place])  - Continue diet/meds, exercise,& lifestyle modifications.  - Continue monitor periodic cholesterol/liver & renal functions   - Lipid panel - TSH  3. LADA (latent autoimmune diabetes in adults), managed as type 1 ([place])  - Continue diet, exercise  - Lifestyle modifications.  - Monitor appropriate labs.  - Hemoglobin A1c  4. Vitamin D deficiency  - Continue  supplementation.  - VITAMIN D 25 Hydroxy  5. Type 2 diabetes mellitus with stage 2 chronic kidney  disease, with long-term current use of insulin (HCC)  - COMPLETE METABOLIC PANEL WITH GFR - Hemoglobin A1c  6. Idiopathic gout  - Uric acid  7. Testosterone deficiency  - Testosterone  8. Hypothyroidism  - TSH  9. Gastroesophageal reflux disease  - CBC with Differential/Platelet  10. Aortic atherosclerosis ([place]) by CXR 03/11/2020   11. Abnormal CXR   12. OSA on CPAP   13. Medication management  - CBC with Differential/Platelet - COMPLETE METABOLIC PANEL WITH GFR - Magnesium - Lipid panel - TSH - Hemoglobin A1c - VITAMIN D 25 Hydroxy  - Uric acid - Testosterone       Discussed  regular exercise, BP monitoring, weight control to achieve/maintain BMI less than 25 and discussed med and SE's. Recommended labs to assess and monitor clinical status with further disposition pending results of labs.  I discussed the assessment and treatment plan with the patient. The patient was provided an opportunity to ask questions and all were answered. The patient agreed with the plan and demonstrated an understanding of the instructions.  I provided over 30 minutes of exam, counseling, chart review and  complex critical decision making.   Kirtland Bouchard, MD

## 2020-07-07 ENCOUNTER — Encounter: Payer: Self-pay | Admitting: Internal Medicine

## 2020-07-07 ENCOUNTER — Other Ambulatory Visit: Payer: Self-pay

## 2020-07-07 ENCOUNTER — Ambulatory Visit: Payer: 59 | Admitting: Internal Medicine

## 2020-07-07 VITALS — BP 130/90 | HR 62 | Temp 97.5°F | Resp 16 | Ht 77.0 in | Wt 232.4 lb

## 2020-07-07 DIAGNOSIS — E039 Hypothyroidism, unspecified: Secondary | ICD-10-CM

## 2020-07-07 DIAGNOSIS — Z9989 Dependence on other enabling machines and devices: Secondary | ICD-10-CM

## 2020-07-07 DIAGNOSIS — R9389 Abnormal findings on diagnostic imaging of other specified body structures: Secondary | ICD-10-CM

## 2020-07-07 DIAGNOSIS — Z794 Long term (current) use of insulin: Secondary | ICD-10-CM

## 2020-07-07 DIAGNOSIS — G4733 Obstructive sleep apnea (adult) (pediatric): Secondary | ICD-10-CM

## 2020-07-07 DIAGNOSIS — E559 Vitamin D deficiency, unspecified: Secondary | ICD-10-CM

## 2020-07-07 DIAGNOSIS — M1000 Idiopathic gout, unspecified site: Secondary | ICD-10-CM

## 2020-07-07 DIAGNOSIS — R918 Other nonspecific abnormal finding of lung field: Secondary | ICD-10-CM

## 2020-07-07 DIAGNOSIS — I700 Atherosclerosis of aorta: Secondary | ICD-10-CM

## 2020-07-07 DIAGNOSIS — Z23 Encounter for immunization: Secondary | ICD-10-CM

## 2020-07-07 DIAGNOSIS — I10 Essential (primary) hypertension: Secondary | ICD-10-CM

## 2020-07-07 DIAGNOSIS — K2100 Gastro-esophageal reflux disease with esophagitis, without bleeding: Secondary | ICD-10-CM

## 2020-07-07 DIAGNOSIS — E139 Other specified diabetes mellitus without complications: Secondary | ICD-10-CM

## 2020-07-07 DIAGNOSIS — N182 Chronic kidney disease, stage 2 (mild): Secondary | ICD-10-CM

## 2020-07-07 DIAGNOSIS — R079 Chest pain, unspecified: Secondary | ICD-10-CM

## 2020-07-07 DIAGNOSIS — Z79899 Other long term (current) drug therapy: Secondary | ICD-10-CM

## 2020-07-07 DIAGNOSIS — E349 Endocrine disorder, unspecified: Secondary | ICD-10-CM

## 2020-07-07 DIAGNOSIS — E1069 Type 1 diabetes mellitus with other specified complication: Secondary | ICD-10-CM

## 2020-07-07 DIAGNOSIS — E1122 Type 2 diabetes mellitus with diabetic chronic kidney disease: Secondary | ICD-10-CM

## 2020-07-07 DIAGNOSIS — E785 Hyperlipidemia, unspecified: Secondary | ICD-10-CM

## 2020-07-07 NOTE — Patient Instructions (Signed)

## 2020-07-08 LAB — COMPLETE METABOLIC PANEL WITH GFR
AG Ratio: 1.9 (calc) (ref 1.0–2.5)
ALT: 17 U/L (ref 9–46)
AST: 16 U/L (ref 10–35)
Albumin: 4.5 g/dL (ref 3.6–5.1)
Alkaline phosphatase (APISO): 60 U/L (ref 35–144)
BUN: 21 mg/dL (ref 7–25)
CO2: 29 mmol/L (ref 20–32)
Calcium: 10 mg/dL (ref 8.6–10.3)
Chloride: 105 mmol/L (ref 98–110)
Creat: 1.04 mg/dL (ref 0.70–1.25)
GFR, Est African American: 90 mL/min/{1.73_m2} (ref >=60)
GFR, Est Non African American: 78 mL/min/{1.73_m2} (ref >=60)
Globulin: 2.4 g/dL (calc) (ref 1.9–3.7)
Glucose, Bld: 120 mg/dL — ABNORMAL HIGH (ref 65–99)
Potassium: 4.9 mmol/L (ref 3.5–5.3)
Sodium: 140 mmol/L (ref 135–146)
Total Bilirubin: 0.6 mg/dL (ref 0.2–1.2)
Total Protein: 6.9 g/dL (ref 6.1–8.1)

## 2020-07-08 LAB — CBC WITH DIFFERENTIAL/PLATELET
Absolute Monocytes: 551 cells/uL (ref 200–950)
Basophils Absolute: 71 cells/uL (ref 0–200)
Basophils Relative: 1.4 %
Eosinophils Absolute: 92 cells/uL (ref 15–500)
Eosinophils Relative: 1.8 %
HCT: 43.4 % (ref 38.5–50.0)
Hemoglobin: 14.9 g/dL (ref 13.2–17.1)
Lymphs Abs: 1229 cells/uL (ref 850–3900)
MCH: 32.7 pg (ref 27.0–33.0)
MCHC: 34.3 g/dL (ref 32.0–36.0)
MCV: 95.2 fL (ref 80.0–100.0)
MPV: 9.8 fL (ref 7.5–12.5)
Monocytes Relative: 10.8 %
Neutro Abs: 3157 cells/uL (ref 1500–7800)
Neutrophils Relative %: 61.9 %
Platelets: 272 10*3/uL (ref 140–400)
RBC: 4.56 10*6/uL (ref 4.20–5.80)
RDW: 12 % (ref 11.0–15.0)
Total Lymphocyte: 24.1 %
WBC: 5.1 10*3/uL (ref 3.8–10.8)

## 2020-07-08 LAB — HEMOGLOBIN A1C
Hgb A1c MFr Bld: 9.4 % of total Hgb — ABNORMAL HIGH (ref <5.7)
Mean Plasma Glucose: 223 (calc)
eAG (mmol/L): 12.4 (calc)

## 2020-07-08 LAB — TESTOSTERONE: Testosterone: 596 ng/dL (ref 250–827)

## 2020-07-08 LAB — LIPID PANEL
Cholesterol: 125 mg/dL (ref <200)
HDL: 45 mg/dL (ref >=40)
LDL Cholesterol (Calc): 67 mg/dL (calc)
Non-HDL Cholesterol (Calc): 80 mg/dL (calc) (ref <130)
Total CHOL/HDL Ratio: 2.8 (calc) (ref <5.0)
Triglycerides: 57 mg/dL (ref <150)

## 2020-07-08 LAB — URIC ACID: Uric Acid, Serum: 4 mg/dL (ref 4.0–8.0)

## 2020-07-08 LAB — MAGNESIUM: Magnesium: 2.1 mg/dL (ref 1.5–2.5)

## 2020-07-08 LAB — TSH: TSH: 0.54 mIU/L (ref 0.40–4.50)

## 2020-07-08 LAB — VITAMIN D 25 HYDROXY (VIT D DEFICIENCY, FRACTURES): Vit D, 25-Hydroxy: 40 ng/mL (ref 30–100)

## 2020-07-08 NOTE — Progress Notes (Signed)
========================================================== ==========================================================  -    Total Chol = 125 and LDL = 67 - Both  Excellent   - Very low risk for Heart Attack  / Stroke =============================================================  - Thyroid Normal - Keep dose same  ==========================================================  -  A1c = 9.4% - Way to High  ! (Ideal or Goal is less than 6.0%)   -  Being diabetic has a  300% increased risk for heart attack,  stroke, cancer, and alzheimer- type vascular dementia.   -  It is very important that you work harder with diet by  avoiding all foods that are white except chicken,   fish & calliflower.  - Avoid white rice  (brown & wild rice is OK),   - Avoid white potatoes  (sweet potatoes in moderation is OK),   White bread or wheat bread or anything made out of   white flour like bagels, donuts, rolls, buns, biscuits, cakes,  - pastries, cookies, pizza crust, and pasta (made from  white flour & egg whites)   - vegetarian pasta or spinach or wheat pasta is OK.  - Multigrain breads like Arnold's, Pepperidge Farm or   multigrain sandwich thins or high fiber breads like   [place] bread or "Dave[employer] Killer" breads that are  4 to 5 grams fiber per slice !  are best.   ==========================================================  -  Vitamin D = 40 - Very Low  - Vitamin D goal is between 70-100.   $$$$$$$$$$$$$$$$$$$$$$$$$$$$$$$$$$$$$$$$$$$$$$$$$$$$$$$$$$$$$  - Please INCREASE your Vitamin D 4,000 units up to 10,000 units /daily  $$$$$$$$$$$$$$$$$$$$$$$$$$$$$$$$$$$$$$$$$$$$$$$$$$$$$$$$$$$$$  - It is very important as a natural anti-inflammatory and helping the  immune system protect against viral infections, like the Covid-19   helping hair, skin, and nails, as well as reducing stroke and  heart attack risk.   - It helps your bones and helps with mood.  - It also decreases numerous  cancer risks so please  take it as directed.   - Low Vit D is associated with a 200-300% higher risk for  CANCER   and 200-300% higher risk for HEART   ATTACK  &  STROKE.    - It is also associated with higher death rate at younger ages,   autoimmune diseases like Rheumatoid arthritis, Lupus,  Multiple Sclerosis.     - Also many other serious conditions, like depression, Alzheimer's  Dementia, infertility, muscle aches, fatigue, fibromyalgia   - just to name a few. ==========================================================  -Testosterone =596 - Normal - Great  ==========================================================  - Kidney functions remain stable with GFR 78 -- s- Stage 22 - &   stable over the last  4-5 years  -  All Else - CBC - Electrolytes - Liver - Magnesium & Thyroid    - all  Normal / OK ====================================================   -

## 2020-07-09 NOTE — Progress Notes (Signed)
Patient is aware of lab results and instructions. -e welch

## 2020-07-10 ENCOUNTER — Ambulatory Visit
Admission: RE | Admit: 2020-07-10 | Discharge: 2020-07-10 | Disposition: A | Discharge status: Home / Self Care | Payer: 59 | Source: Ambulatory Visit | Attending: Internal Medicine | Admitting: Internal Medicine

## 2020-07-10 DIAGNOSIS — R918 Other nonspecific abnormal finding of lung field: Secondary | ICD-10-CM

## 2020-07-10 DIAGNOSIS — R079 Chest pain, unspecified: Secondary | ICD-10-CM

## 2020-07-11 NOTE — Progress Notes (Signed)
CXR I s Normal   Heart , Lungs , Bones - All appear Normal - No sign of Cancer

## 2020-07-13 ENCOUNTER — Other Ambulatory Visit: Payer: Self-pay | Admitting: Internal Medicine

## 2020-07-14 ENCOUNTER — Other Ambulatory Visit: Payer: Self-pay | Admitting: Internal Medicine

## 2020-07-14 DIAGNOSIS — E782 Mixed hyperlipidemia: Secondary | ICD-10-CM

## 2020-08-26 ENCOUNTER — Other Ambulatory Visit: Payer: Self-pay | Admitting: Internal Medicine

## 2020-09-10 ENCOUNTER — Telehealth: Payer: Self-pay

## 2020-09-10 NOTE — Telephone Encounter (Signed)
Tested positive for Covid yesterday. Has a cough. Requesting Prednisone. Please advise.

## 2020-09-11 ENCOUNTER — Ambulatory Visit: Payer: 59 | Admitting: Adult Health

## 2020-09-11 ENCOUNTER — Encounter: Payer: Self-pay | Admitting: Adult Health

## 2020-09-11 ENCOUNTER — Other Ambulatory Visit: Payer: Self-pay

## 2020-09-11 VITALS — BP 156/96 | HR 70 | Temp 99.2°F | Wt 222.0 lb

## 2020-09-11 DIAGNOSIS — U071 COVID-19: Secondary | ICD-10-CM | POA: Diagnosis not present

## 2020-09-11 DIAGNOSIS — E1365 Other specified diabetes mellitus with hyperglycemia: Secondary | ICD-10-CM

## 2020-09-11 DIAGNOSIS — Z794 Long term (current) use of insulin: Secondary | ICD-10-CM

## 2020-09-11 HISTORY — DX: COVID-19: U07.1

## 2020-09-11 NOTE — Progress Notes (Signed)
THIS ENCOUNTER IS A VIRTUAL VISIT DUE TO COVID-19 - PATIENT WAS NOT SEEN IN THE OFFICE.  PATIENT HAS CONSENTED TO VIRTUAL VISIT / TELEMEDICINE VISIT   Virtual Visit via telephone Note  I connected with Daniel Case on 09/11/2020 by telephone.  I verified that I am speaking with the correct person using two identifiers.    I discussed the limitations of evaluation and management by telemedicine and the availability of in person appointments. The patient expressed understanding and agreed to proceed.  History of Present Illness:  BP (!) 156/96   Pulse 70   Temp 99.2 F (37.3 C)   Wt 222 lb (100.7 kg)   SpO2 97%   BMI 26.33 kg/m   60 y.o. patient with hx of htn, OSA/CPAP, LADA contacted office due to URI sx suspect for covid 19. They were found to be positive on rapid screen, OV was converted to telephone visit to minimize exposure in office. This patient was vaccinated for covid 19 - 2/2, moderna.   He reports got tested at work, gets PCR testing every other week  Sx began 4 days ago, running nose, scratchy throat, was persistent next AM, then started developing sore throat that afternoon, wife called him and notified that she had tested + for covid, got testing at work 09/09/2020 was +, and returned home. Yesterday was feeling worse until the evening, but seems to be clearing up overnight, sore throat is improved, congestion seems to be breaking up, temp is coming down, only around 99.   Using sugar free cough drops, alternating tylenol/ibuprofen. Feels well controlled and doing well.   LADA - on metformin 4 tabs daily, recently taking humalog 75/25 15 units AM, 10 units PM. He reports has been eating "almost nothing." but fastings persistent above 200, will eat eggs only for breakfast and recheck with glucose 300+   Exposures: wife  Vaccination: 2/2, moderna, 2021,    Medications  Current Outpatient Medications (Endocrine & Metabolic):  Marland Kitchen  Insulin Lispro Prot & Lispro (HUMALOG  MIX 75/25 KWIKPEN) (75-25) 100 UNIT/ML Kwikpen, INJECT SUBCUTANEOUSLY 20  UNITS WITH FIRST MEAL OF  THE DAY AND 10 UNITS WITH  LARGEST MEAL OF THE DAY  PRIOR TO AT BEDTIME .  levothyroxine (SYNTHROID) 300 MCG tablet, Take 1 tablet daily on an empty stomach with only water for 30 minutes & no Antacid meds, Calcium or Magnesium for 4 hours & avoid Biotin .  metFORMIN (GLUCOPHAGE-XR) 500 MG 24 hr tablet, TAKE 1 TABLET WITH  BREAKFAST AND LUNCH, AND 2  TABLETS WITH SUPPER FOR  DIABETES .  testosterone cypionate (DEPOTESTOSTERONE CYPIONATE) 200 MG/ML injection, INJECT 2 ML AS DIRECTED  INTRAMUSCULARLY EVERY 14  DAYS  Current Outpatient Medications (Cardiovascular):  .  olmesartan (BENICAR) 20 MG tablet, Take 1 tablet (20 mg total) by mouth daily. .  rosuvastatin (CRESTOR) 20 MG tablet, TAKE 1 TABLET BY MOUTH  DAILY FOR CHOLESTEROL   Current Outpatient Medications (Analgesics):  .  allopurinol (ZYLOPRIM) 100 MG tablet, Take 1 tablet Daily to Prevent Gout   Current Outpatient Medications (Other):  Marland Kitchen  BD PEN NEEDLE NANO U/F 32G X 4 MM MISC, USE TWICE A DAY AS DIRECTED .  Cholecalciferol (VITAMIN D PO), Take 4,000 Int'l Units by mouth daily. Marland Kitchen  CINNAMON PO, Take 1,000 mg by mouth. .  Continuous Blood Gluc Sensor (FREESTYLE LIBRE 14 DAY SENSOR) MISC, APPLY NEW SENSOR EVERY 14  DAYS TO CHECK BLOOD SUGAR 3 TIMES DAILY .  Continuous Blood Gluc Sensor  MISC, 1 each by Does not apply route as directed. Check blood sugar 3 times a day Dx:E10.9 .  glucose blood test strip, Check blood sugar 3 times a day-DX-E13.9. .  NEEDLE, DISP, 21 G (BD ECLIPSE NEEDLE) 21G X 1" MISC, Use to inject 2 mL intramuscularly every 14 days. .  pantoprazole (PROTONIX) 40 MG tablet, Take 1 tablet (40 mg total) by mouth daily. .  SYRINGE-NEEDLE, DISP, 3 ML (BD ECLIPSE SYRINGE) 21G X 1" 3 ML MISC, Use to inject 2 mL intramuscularly every 14 days  Allergies:  Allergies  Allergen Reactions  . Shellfish Allergy Anaphylaxis and Rash   . Atorvastatin Other (See Comments)    Myalgias    Problem list He has Hypothyroidism; Erectile dysfunction associated with type 2 diabetes mellitus ([place]); RENAL CYST; OSA on CPAP; NEPHROLITHIASIS, HX OF; Anxiety; Testosterone deficiency; Mixed diabetic hyperlipidemia associated with type 1 diabetes mellitus ([place]); Hypertension; Medication management; Vitamin D deficiency; LADA (latent autoimmune diabetes in adults), managed as type 1 ([place]); History of insulin dependent diabetes mellitus; and Aortic atherosclerosis ([place]) by CXR 03/11/2020 on their problem list.   Social History:   reports that he quit smoking about 7 years ago. His smoking use included cigars. He has never used smokeless tobacco. He reports current alcohol use of about 14.0 standard drinks of alcohol per week. He reports that he does not use drugs.   Observations/Objective:  General : Well sounding patient in no apparent distress HEENT: no hoarseness, no cough for duration of visit Lungs: speaks in complete sentences, no audible wheezing, no apparent distress Neurological: alert, oriented x 3 Psychiatric: pleasant, judgement appropriate   Assessment and Plan:  COVID-19  Covid 19 positive per rapid screening test in vaccinated Currently symptoms are mild and resolving Risk factors include T2DM, htn Not meeting current guidelines for monoclonal antibodies Regular breathing exercises, proning if dyspnea EC bASA daily for clot prevention unless contraindicated, regular walking/calf exercises Take tylenol PRN temp 101+ Push hydration Sx supportive therapy Immune support with vitamin C, zinc, vitamin D Follow up via mychart or telephone if needed Advised patient obtain O2 monitor; present to ED if persistently <88% or with severe dyspnea, any CP, fever uncontrolled by tylenol, confusion, sudden decline Should remain in isolation until at least 5 days from onset of sx, 24-48 hours fever free without tylenol, sx such as  cough are improved, strict masking/distancing for an additional 5 days  LADA with hyperglycemia (HCC) Pathophysiology discussed; recommended increasing insulin by 2 unit increments until fasting glucose <150, check frequently with CGM, as infection resolves expect glucose to start going down rapidly and dose conservatively. Focus on increasing AM insulin, avoid nocturnal lows; low sugar much more dangerous than high in short term. Hypoglycemia sx and management reviewed. He is able to verbalize understanding back -  Follow up PRN if further concerns;    Follow Up Instructions:  I discussed the assessment and treatment plan with the patient. The patient was provided an opportunity to ask questions and all were answered. The patient agreed with the plan and demonstrated an understanding of the instructions.   The patient was advised to call back or seek an in-person evaluation if the symptoms worsen or if the condition fails to improve as anticipated.  I provided 20 minutes of non-face-to-face time during this encounter.   Izora Ribas, NP   Future Appointments  Date Time Provider [place]  10/17/2020  9:00 AM [name], NP [facility] None  02/16/2021  3:00 PM  [name], NP [facility] None

## 2020-10-17 ENCOUNTER — Ambulatory Visit: Payer: 59 | Admitting: Adult Health

## 2020-10-24 ENCOUNTER — Other Ambulatory Visit: Payer: Self-pay

## 2020-10-24 DIAGNOSIS — E782 Mixed hyperlipidemia: Secondary | ICD-10-CM

## 2020-10-24 MED ORDER — ROSUVASTATIN CALCIUM 20 MG PO TABS: ORAL_TABLET | ORAL | 3 refills | Status: DC

## 2020-10-24 MED ORDER — "BD ECLIPSE NEEDLE 21G X 1"" MISC": 0 refills | Status: AC

## 2020-11-03 ENCOUNTER — Other Ambulatory Visit: Payer: Self-pay

## 2020-11-03 MED ORDER — TESTOSTERONE CYPIONATE 200 MG/ML IM SOLN: INTRAMUSCULAR | 0 refills | Status: DC

## 2020-11-06 NOTE — Progress Notes (Signed)
Patient ID: Daniel Case, male   DOB: Feb 09, 1961, 60 y.o.   MRN: [id number]  Assessment and Plan:   Hypertension:  -Continue medication  -monitor blood pressure at home.  -  goal is <130/80 -Continue DASH diet.   -Reminder to go to the ER if any CP, SOB, nausea, dizziness, severe HA, changes vision/speech, left arm numbness and tingling, and jaw pain.  Hyperlipidemia associated with diabetes  - LDL goal <70; at goal; continue statin  -Continue diet and exercise.  -Check cholesterol.   Diabetes due to LADA - with episodic hypoglycemia and poor control, insulin dependent  -Continue diet and exercise. Recommended low carb, small, frequent meals - concern with labile blood glucose are improved with continuous glucose monitor - taking insulin 15 units AM and PM, reports had more hypoglycemia and and high AM glucose with 20/10 - restart continuous glucose monitoring with frequent checks for tighter control  - Then titrate diet or insulin as needed for fasting glucose <130 -Check A1C  CKD II due to diabetes -Increase fluids, avoid NSAIDS, monitor sugars, will monitor  Vitamin D Def: - back on supplement - goal 60-100 -defer level today   Hypothyroidism -check TSH level, continue medications the same, reminded to take on an empty stomach (needs to be 30 min prior to coffee with water only)   Gout - Continue allopurinol - Diet discussed; Check uric acid as needed  Testosterone deficiency Continued perceived benefits with supplementation, no SE. Resent to restart; next OV hold shot prior to appointment; ideally want to check at least 7 days from last; no symptoms of elevated estrogen or dihydrotestosterone.    Continue diet and meds as discussed. Further disposition pending results of labs.  Future Appointments  Date Time Provider [place]  02/16/2021  3:00 PM [name], NP [facility] None    HPI 60 y.o. male  presents for 3 month follow up with hypertension,  hyperlipidemia, T2 diabetes with CKD, hypothyroid, gout, testosterone def and vitamin D.  He has OSA on CPAP endorses 100% with restorative sleep.   He reports has been having trouble with mail order pharmacy - has been out of several meds, resent everything today.   BMI is Body mass index is 27.99 kg/m., he has been working on diet but admits limited exercise. Previously was going to gym and hiking, stopped with pandemic, needs to restart, work gym opening back up in April.  He does watch portions for diet and trying to follow Dr. Burman Nieves dietary guidelines (high veggies with low larb, low animal protein).  He checks weight at home Wt Readings from Last 3 Encounters:  11/07/20 236 lb (107 kg)  09/11/20 222 lb (100.7 kg)  07/07/20 232 lb 6.4 oz (105.4 kg)     His blood pressure has been controlled at home (120-130/low 70s), today their BP is BP: 120/80.    He does workout. He denies chest pain, shortness of breath, dizziness.   He is on cholesterol medication (on rosuvastatin 40 mg daily) and denies myalgias. His cholesterol is at goal. The cholesterol last visit was:   Lab Results  Component Value Date   CHOL 125 07/07/2020   HDL 45 07/07/2020   LDLCALC 67 07/07/2020   LDLDIRECT 127.3 10/01/2009   TRIG 57 07/07/2020   CHOLHDL 2.8 07/07/2020     He has been working on diet and exercise for diabetes due to LADA, patient now on metformin 2000 mg daily and humalog 75/25 mix, reports has been taking 15 units  AM, 15 units PM, and denies foot ulcerations, hyperglycemia, increased appetite, nausea, paresthesia of the feet, polydipsia, polyuria, visual disturbances, vomiting and weight loss. He is on ACEi, statin; Reports did have diabetes eye exam, my eye doctor at [facility] - last ? 06/2020  He was reporting numerous episodes of hypoglycemia at various times of the day, as low as 38, continuous glucose monitoring device (freestyle [place]) was ordered with instructions to keep close food  log. He reports since covid 19 infection in Feb, glucose has been persistently running high. Has been unable to get sensors due to needing prior auth  Checks fasting: recently 200-260 Checks prior to bedtime: recently 130-180.  Typically reports that bedtime glucose is lower than fasting/AM  Last A1C in the office was:  Lab Results  Component Value Date   HGBA1C 9.4 (H) 07/07/2020    He has CKD II associated with diabetes monitored at this office:  Lab Results  Component Value Date   [facility] 78 07/07/2020   Patient is on Vitamin D supplement, was off of supplement, back on 4000 IU Lab Results  Component Value Date   VD25OH 40 07/07/2020     Patient underwent Thyroidectomy in 2001 for Thyroid Cancer and is on supressive and replacement therapy.  His medication was not changed last visit as he reported was taking with coffee rather than water on empty stomach. He admits still drinking coffee with splash of milk on the way to work.   Thyroxine 300 mcg - 1 tablet 5 x /week And 1/2 tab on Tues/Thurs. Lab Results  Component Value Date   TSH 0.54 07/07/2020   He has a history of testosterone deficiency and is on testosterone replacement, was on topical therapy but was recently transitioned back to injection with perceived improved benefit, taking 200 mg every 2 weeks, last injection was 7 weeks ago due to being out of needles. He states that the testosterone helps with his energy, libido, muscle mass. Lab Results  Component Value Date   TESTOSTERONE 596 07/07/2020     Current Medications:  Current Outpatient Medications on File Prior to Visit  Medication Sig Dispense Refill  . BD PEN NEEDLE NANO U/F 32G X 4 MM MISC USE TWICE A DAY AS DIRECTED  11  . Cholecalciferol (VITAMIN D PO) Take 4,000 Int'l Units by mouth daily.    Marland Kitchen CINNAMON PO Take 1,000 mg by mouth.    . Continuous Blood Gluc Sensor (FREESTYLE LIBRE 14 DAY SENSOR) MISC APPLY NEW SENSOR EVERY 14  DAYS TO CHECK BLOOD SUGAR 3  TIMES DAILY 6 each 1  . Continuous Blood Gluc Sensor MISC 1 each by Does not apply route as directed. Check blood sugar 3 times a day Dx:E10.9 1 each 0  . glucose blood test strip Check blood sugar 3 times a day-DX-E13.9. 100 each 6  . Insulin Lispro Prot & Lispro (HUMALOG MIX 75/25 KWIKPEN) (75-25) 100 UNIT/ML Kwikpen INJECT SUBCUTANEOUSLY 20  UNITS WITH FIRST MEAL OF  THE DAY AND 10 UNITS WITH  LARGEST MEAL OF THE DAY  PRIOR TO AT BEDTIME 30 mL 3  . NEEDLE, DISP, 21 G (BD ECLIPSE NEEDLE) 21G X 1" MISC Use to inject 2 mL intramuscularly every 14 days. 100 each 0  . SYRINGE-NEEDLE, DISP, 3 ML (BD ECLIPSE SYRINGE) 21G X 1" 3 ML MISC Use to inject 2 mL intramuscularly every 14 days 100 each 0   No current facility-administered medications on file prior to visit.    Medical  History:  Past Medical History:  Diagnosis Date  . Allergy   . Anxiety   . Cancer ([place])    thyroid  . Complication of anesthesia 2001   panic attack when going under anesthesia  . Diabetes mellitus without complication ([place])    type 2  . Heart murmur    as a teen  . Hyperlipidemia   . Hypothyroidism   . Kidney stones   . Other testicular hypofunction   . PONV (postoperative nausea and vomiting)   . Sleep apnea     Allergies:  Allergies  Allergen Reactions  . Shellfish Allergy Anaphylaxis and Rash  . Atorvastatin Other (See Comments)    Myalgias     Allergies:  Allergies  Allergen Reactions  . Shellfish Allergy Anaphylaxis and Rash  . Atorvastatin Other (See Comments)    Myalgias   Surgical History:  He  has a past surgical history that includes Thyroidectomy (2001); Cystoscopy/retrograde/ureteroscopy/stone extraction with basket (Right, 01/19/2016); Cystoscopy with holmium laser lithotripsy (Right, 01/19/2016); and Colonoscopy. Family History:  Hisfamily history includes Diabetes in his father and sister; Hypertension in his father; Prostate cancer in his maternal grandfather and paternal  grandfather. Social History:   reports that he quit smoking about 7 years ago. His smoking use included cigars. He has never used smokeless tobacco. He reports current alcohol use of about 14.0 standard drinks of alcohol per week. He reports that he does not use drugs.   Review of Systems:  Review of Systems  Constitutional: Negative for chills, fever, malaise/fatigue and weight loss.  HENT: Negative for congestion, ear pain, hearing loss, sore throat and tinnitus.   Eyes: Negative.  Negative for blurred vision and double vision.  Respiratory: Negative for cough, shortness of breath and wheezing.   Cardiovascular: Negative for chest pain, palpitations, orthopnea, claudication and leg swelling.  Gastrointestinal: Negative for abdominal pain, blood in stool, constipation, diarrhea, heartburn, melena, nausea and vomiting.  Genitourinary: Negative.   Musculoskeletal: Negative for joint pain and myalgias.  Skin: Negative for rash.  Neurological: Negative for dizziness, tingling, sensory change, loss of consciousness, weakness and headaches.  Endo/Heme/Allergies: Negative for polydipsia.  Psychiatric/Behavioral: Negative.  Negative for depression. The patient is not nervous/anxious and does not have insomnia.   All other systems reviewed and are negative.   Physical Exam: BP 120/80   Pulse (!) 59   Temp 97.7 F (36.5 C)   Wt 236 lb (107 kg)   SpO2 94%   BMI 27.99 kg/m  Wt Readings from Last 3 Encounters:  11/07/20 236 lb (107 kg)  09/11/20 222 lb (100.7 kg)  07/07/20 232 lb 6.4 oz (105.4 kg)    General Appearance: Well nourished well developed, in no apparent distress. Eyes: PERRLA, EOMs, conjunctiva no swelling or erythema ENT/Mouth: R Ear canal normal without obstruction, swelling, erythma, discharge. L obstructed by firm cerumen.  TMs normal bilaterally after irrigation.  Oropharynx moist, clear, without exudate, or postoropharyngeal swelling. Neck: Supple, thyroid normal,no  cervical adenopathy  Respiratory: Respiratory effort normal, Breath sounds clear A&P without rhonchi, wheeze, or rale.  No retractions, no accessory usage. Cardio: RRR with no MRGs. Brisk peripheral pulses without edema.  Abdomen: Soft, + BS,  Non tender, no guarding, rebound, hernias, masses. Musculoskeletal: Full ROM, 5/5 strength, Normal gait Skin: Warm, dry without rashes, lesions, ecchymosis.  Neuro: Awake and oriented X 3, Cranial nerves intact. Normal muscle tone, no cerebellar symptoms. Psych: Normal affect, Insight and Judgment appropriate.    Izora Ribas, NP 10:01  AM [facility]

## 2020-11-07 ENCOUNTER — Other Ambulatory Visit: Payer: Self-pay

## 2020-11-07 ENCOUNTER — Encounter: Payer: Self-pay | Admitting: Adult Health

## 2020-11-07 ENCOUNTER — Ambulatory Visit: Payer: 59 | Admitting: Adult Health

## 2020-11-07 VITALS — BP 120/80 | HR 59 | Temp 97.7°F | Wt 236.0 lb

## 2020-11-07 DIAGNOSIS — Z79899 Other long term (current) drug therapy: Secondary | ICD-10-CM

## 2020-11-07 DIAGNOSIS — E11649 Type 2 diabetes mellitus with hypoglycemia without coma: Secondary | ICD-10-CM

## 2020-11-07 DIAGNOSIS — E139 Other specified diabetes mellitus without complications: Secondary | ICD-10-CM | POA: Diagnosis not present

## 2020-11-07 DIAGNOSIS — IMO0002 Reserved for concepts with insufficient information to code with codable children: Secondary | ICD-10-CM

## 2020-11-07 DIAGNOSIS — E1069 Type 1 diabetes mellitus with other specified complication: Secondary | ICD-10-CM

## 2020-11-07 DIAGNOSIS — E1165 Type 2 diabetes mellitus with hyperglycemia: Secondary | ICD-10-CM

## 2020-11-07 DIAGNOSIS — I700 Atherosclerosis of aorta: Secondary | ICD-10-CM

## 2020-11-07 DIAGNOSIS — E039 Hypothyroidism, unspecified: Secondary | ICD-10-CM

## 2020-11-07 DIAGNOSIS — R059 Cough, unspecified: Secondary | ICD-10-CM

## 2020-11-07 DIAGNOSIS — E1169 Type 2 diabetes mellitus with other specified complication: Secondary | ICD-10-CM | POA: Diagnosis not present

## 2020-11-07 DIAGNOSIS — E119 Type 2 diabetes mellitus without complications: Secondary | ICD-10-CM

## 2020-11-07 DIAGNOSIS — E782 Mixed hyperlipidemia: Secondary | ICD-10-CM

## 2020-11-07 DIAGNOSIS — E559 Vitamin D deficiency, unspecified: Secondary | ICD-10-CM

## 2020-11-07 DIAGNOSIS — N521 Erectile dysfunction due to diseases classified elsewhere: Secondary | ICD-10-CM

## 2020-11-07 DIAGNOSIS — E349 Endocrine disorder, unspecified: Secondary | ICD-10-CM

## 2020-11-07 DIAGNOSIS — R1310 Dysphagia, unspecified: Secondary | ICD-10-CM

## 2020-11-07 DIAGNOSIS — I10 Essential (primary) hypertension: Secondary | ICD-10-CM

## 2020-11-07 DIAGNOSIS — Z794 Long term (current) use of insulin: Secondary | ICD-10-CM

## 2020-11-07 MED ORDER — FREESTYLE LIBRE 14 DAY SENSOR MISC: 3 refills | Status: DC

## 2020-11-07 MED ORDER — ALLOPURINOL 100 MG PO TABS: ORAL_TABLET | ORAL | 3 refills | Status: DC

## 2020-11-07 MED ORDER — LEVOTHYROXINE SODIUM 300 MCG PO TABS: ORAL_TABLET | ORAL | 3 refills | Status: DC

## 2020-11-07 MED ORDER — ROSUVASTATIN CALCIUM 20 MG PO TABS: ORAL_TABLET | ORAL | 3 refills | Status: DC

## 2020-11-07 MED ORDER — OLMESARTAN MEDOXOMIL 20 MG PO TABS: 20.0000 mg | ORAL_TABLET | Freq: Every day | ORAL | 3 refills | Status: DC

## 2020-11-07 MED ORDER — PANTOPRAZOLE SODIUM 40 MG PO TBEC: 40.0000 mg | DELAYED_RELEASE_TABLET | Freq: Every day | ORAL | 3 refills | Status: AC

## 2020-11-07 MED ORDER — TESTOSTERONE CYPIONATE 200 MG/ML IM SOLN: INTRAMUSCULAR | 0 refills | Status: DC

## 2020-11-07 MED ORDER — METFORMIN HCL ER 500 MG PO TB24: ORAL_TABLET | ORAL | 3 refills | Status: DC

## 2020-11-07 NOTE — Patient Instructions (Signed)
Goals    . Blood Pressure < 130/80    . HEMOGLOBIN A1C < 7    . Peak Blood Glucose <130       Please skip Friday testosterone prior to next visit so we can check levels  Try tumeric+ bioprene or tart cherry supplement - natural safe daily antiinflammatory      Antiinflammatory diet  Doctors are learning that one of the best ways to reduce inflammation lies not in the medicine cabinet, but in the refrigerator.  By following an anti-inflammatory diet you can fight off inflammation for good.  What does an anti-inflammatory diet do? Your immune system becomes activated when your body recognizes anything that is foreign--such as an invading microbe, plant pollen, or chemical. This often triggers a process called inflammation. Intermittent bouts of inflammation directed at truly threatening invaders protect your health.  However, sometimes inflammation persists, day in and day out, even when you are not threatened by a foreign invader. That's when inflammation can become your enemy. Many major diseases that plague us--including cancer, heart disease, diabetes, arthritis, depression, and Alzheimer's--have been linked to chronic inflammation.  One of the most powerful tools to combat inflammation comes not from the pharmacy, but from the grocery store.   Protect yourself from the damage of chronic inflammation. Science has proven that chronic, low-grade inflammation can turn into a silent killer that contributes to cardiovascular disease, cancer, type 2 diabetes and other conditions.   Choose the right anti-inflammatory foods, and you may be able to reduce your risk of illness. Consistently pick the wrong ones, and you could accelerate the inflammatory disease process.     Foods that cause inflammation Try to avoid or limit these foods as much as possible:   refined carbohydrates, such as white bread and pastries  [place] fries and other fried foods  soda and other sugar-sweetened  beverages  red meat (burgers, steaks) and processed meat (hot dogs, sausage)  margarine, shortening, and lard  The health risks of inflammatory foods Not surprisingly, the same foods on an inflammation diet are generally considered bad for our health, including sodas and refined carbohydrates, as well as red meat and processed meats.  "Some of the foods that have been associated with an increased risk for chronic diseases such as type 2 diabetes and heart disease are also associated with excess inflammation," Dr. Melanee Spry says. "It's not surprising, since inflammation is an important underlying mechanism for the development of these diseases."  Unhealthy foods also contribute to weight gain, which is itself a risk factor for inflammation. Yet in several studies, even after researchers took obesity into account, the link between foods and inflammation remained, which suggests weight gain isn't the sole driver. "Some of the food components or ingredients may have independent effects on inflammation over and above increased caloric intake," Dr. Melanee Spry says.  Anti-inflammatory foods An anti-inflammatory diet should include these foods:   tomatoes  olive oil  green leafy vegetables, such as spinach, kale, and collards  nuts like almonds and walnuts  fatty fish like salmon, mackerel, tuna, and sardines  fruits such as strawberries, blueberries, cherries, and oranges  Benefits of anti-inflammatory foods On the flip side are beverages and foods that reduce inflammation, in particular fruits and vegetables such as blueberries, apples, and leafy greens that are high in natural antioxidants and polyphenols--protective compounds found in plants.  Studies have also associated nuts with reduced markers of inflammation and a lower risk of cardiovascular disease and diabetes. Coffee, which contains  polyphenols and other anti-inflammatory compounds, may protect against inflammation, as  well.  Anti-inflammatory diet To reduce levels of inflammation, aim for an overall healthy diet. If you're looking for an eating plan that closely follows the tenets of anti-inflammatory eating, consider the Mediterranean diet, which is high in fruits, vegetables, nuts, whole grains, fish, and healthy oils.  In addition to lowering inflammation, a more natural, less processed diet can have noticeable effects on your physical and emotional health and overall quality of life.   [email or web address].fi

## 2020-11-08 ENCOUNTER — Other Ambulatory Visit: Payer: Self-pay | Admitting: Adult Health

## 2020-11-08 DIAGNOSIS — E039 Hypothyroidism, unspecified: Secondary | ICD-10-CM

## 2020-11-08 LAB — COMPLETE METABOLIC PANEL WITH GFR
AG Ratio: 1.8 (calc) (ref 1.0–2.5)
ALT: 17 U/L (ref 9–46)
AST: 17 U/L (ref 10–35)
Albumin: 4.5 g/dL (ref 3.6–5.1)
Alkaline phosphatase (APISO): 58 U/L (ref 35–144)
BUN/Creatinine Ratio: 27 (calc) — ABNORMAL HIGH (ref 6–22)
BUN: 26 mg/dL — ABNORMAL HIGH (ref 7–25)
CO2: 27 mmol/L (ref 20–32)
Calcium: 9.5 mg/dL (ref 8.6–10.3)
Chloride: 104 mmol/L (ref 98–110)
Creat: 0.95 mg/dL (ref 0.70–1.25)
GFR, Est African American: 100 mL/min/{1.73_m2} (ref >=60)
GFR, Est Non African American: 87 mL/min/{1.73_m2} (ref >=60)
Globulin: 2.5 g/dL (calc) (ref 1.9–3.7)
Glucose, Bld: 191 mg/dL — ABNORMAL HIGH (ref 65–99)
Potassium: 4.8 mmol/L (ref 3.5–5.3)
Sodium: 137 mmol/L (ref 135–146)
Total Bilirubin: 0.7 mg/dL (ref 0.2–1.2)
Total Protein: 7 g/dL (ref 6.1–8.1)

## 2020-11-08 LAB — CBC WITH DIFFERENTIAL/PLATELET
Absolute Monocytes: 769 cells/uL (ref 200–950)
Basophils Absolute: 69 cells/uL (ref 0–200)
Basophils Relative: 1.1 %
Eosinophils Absolute: 38 cells/uL (ref 15–500)
Eosinophils Relative: 0.6 %
HCT: 44.7 % (ref 38.5–50.0)
Hemoglobin: 15.2 g/dL (ref 13.2–17.1)
Lymphs Abs: 1474 cells/uL (ref 850–3900)
MCH: 32 pg (ref 27.0–33.0)
MCHC: 34 g/dL (ref 32.0–36.0)
MCV: 94.1 fL (ref 80.0–100.0)
MPV: 10 fL (ref 7.5–12.5)
Monocytes Relative: 12.2 %
Neutro Abs: 3950 cells/uL (ref 1500–7800)
Neutrophils Relative %: 62.7 %
Platelets: 268 10*3/uL (ref 140–400)
RBC: 4.75 10*6/uL (ref 4.20–5.80)
RDW: 12.3 % (ref 11.0–15.0)
Total Lymphocyte: 23.4 %
WBC: 6.3 10*3/uL (ref 3.8–10.8)

## 2020-11-08 LAB — LIPID PANEL
Cholesterol: 148 mg/dL (ref <200)
HDL: 47 mg/dL (ref >=40)
LDL Cholesterol (Calc): 85 mg/dL (calc)
Non-HDL Cholesterol (Calc): 101 mg/dL (calc) (ref <130)
Total CHOL/HDL Ratio: 3.1 (calc) (ref <5.0)
Triglycerides: 72 mg/dL (ref <150)

## 2020-11-08 LAB — MAGNESIUM: Magnesium: 2.1 mg/dL (ref 1.5–2.5)

## 2020-11-08 LAB — HEMOGLOBIN A1C
Hgb A1c MFr Bld: 8.8 % of total Hgb — ABNORMAL HIGH (ref <5.7)
Mean Plasma Glucose: 206 mg/dL
eAG (mmol/L): 11.4 mmol/L

## 2020-11-08 LAB — TSH: TSH: 0.04 mIU/L — ABNORMAL LOW (ref 0.40–4.50)

## 2020-11-10 ENCOUNTER — Telehealth: Payer: Self-pay | Admitting: *Deleted

## 2020-11-10 NOTE — Telephone Encounter (Signed)
Freestyle Libre 14 day sensor approved through [employer].

## 2020-11-12 ENCOUNTER — Other Ambulatory Visit: Payer: Self-pay | Admitting: Adult Health

## 2020-11-12 MED ORDER — LEVOTHYROXINE SODIUM 300 MCG PO TABS: ORAL_TABLET | ORAL | 3 refills | Status: DC

## 2020-12-11 IMAGING — CR DG CHEST 2V
2 series · 2 of 2 positions shown · non-contrast
Comparison: None.

CLINICAL DATA: Cough.

EXAM:
CHEST - 2 VIEW

[w chest pa]
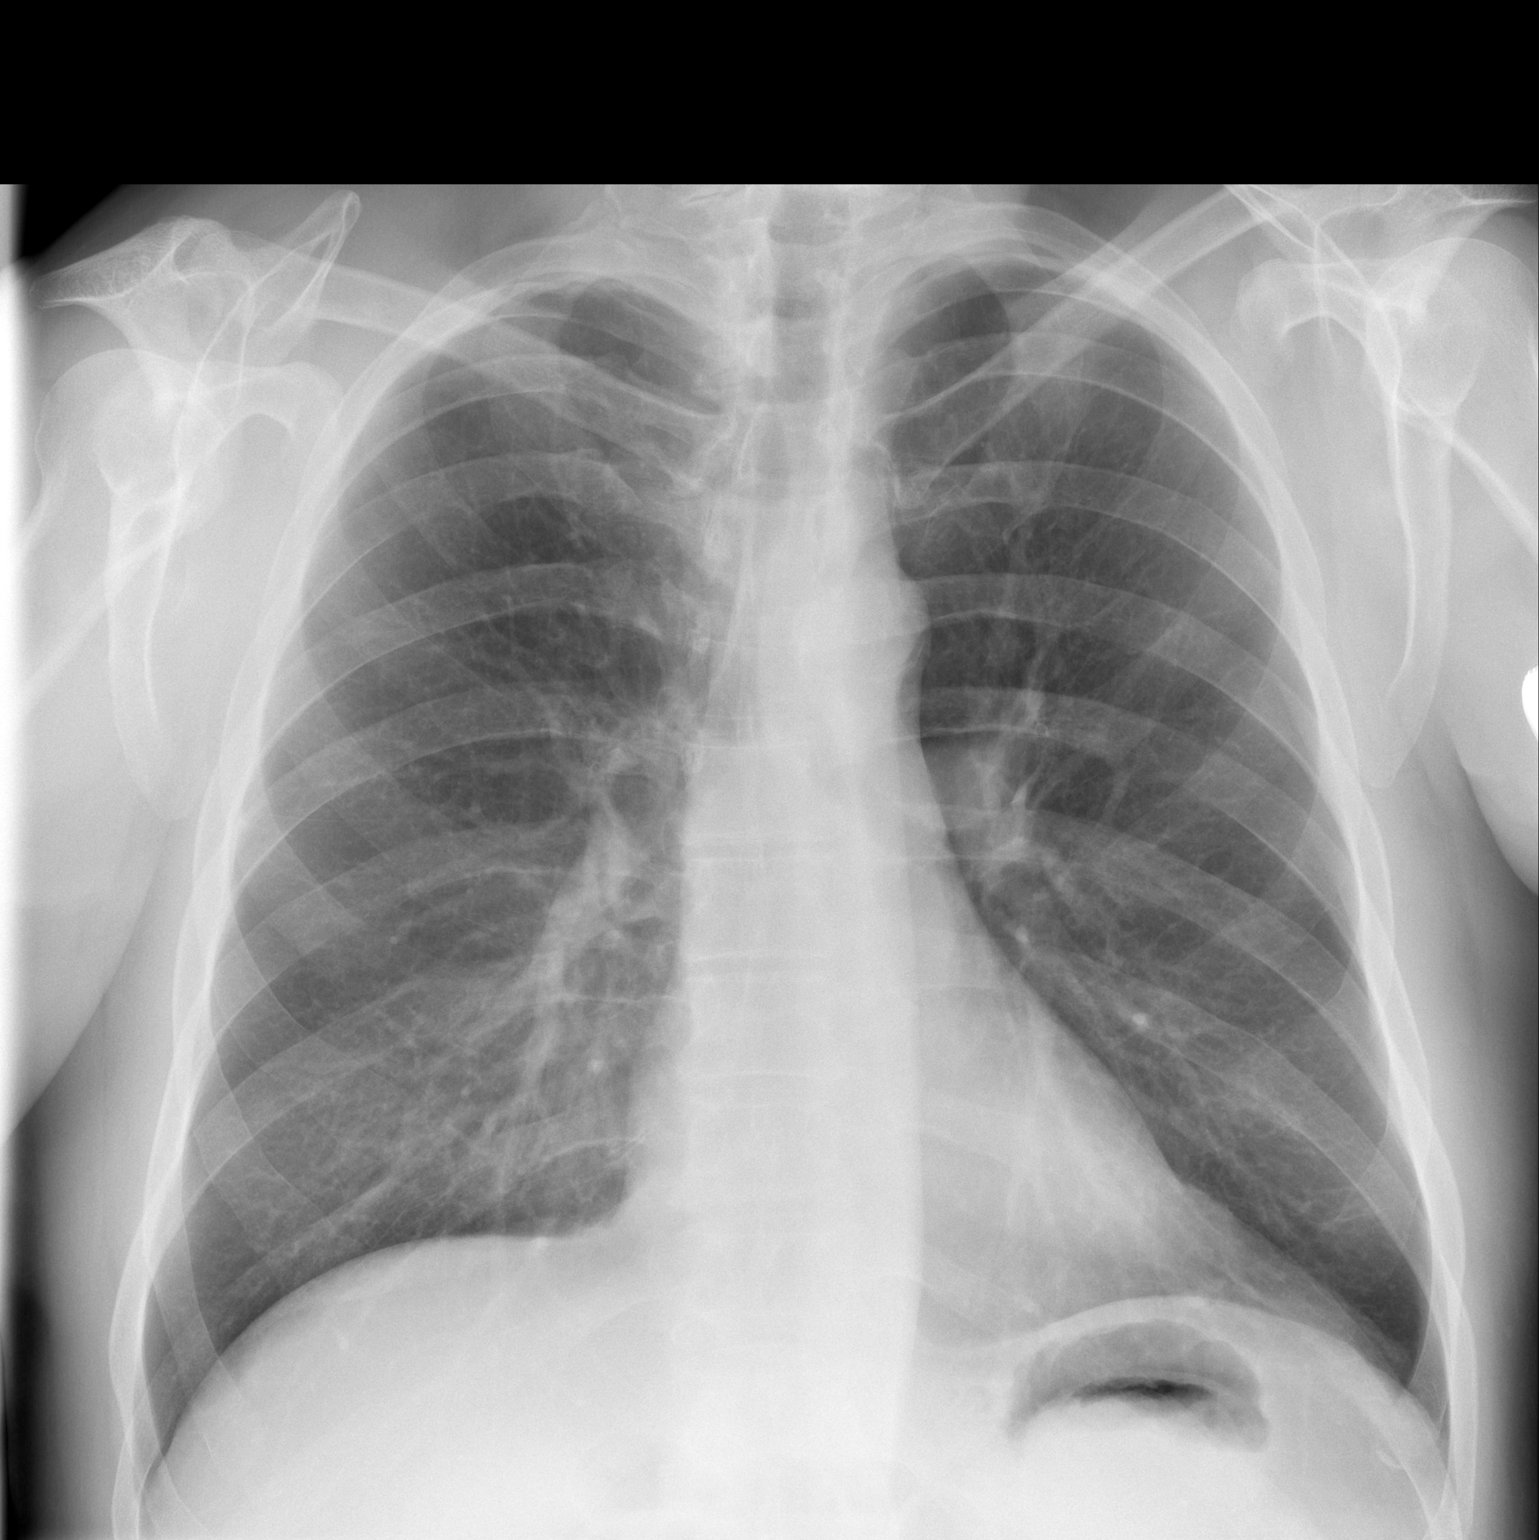

[w chest lat]
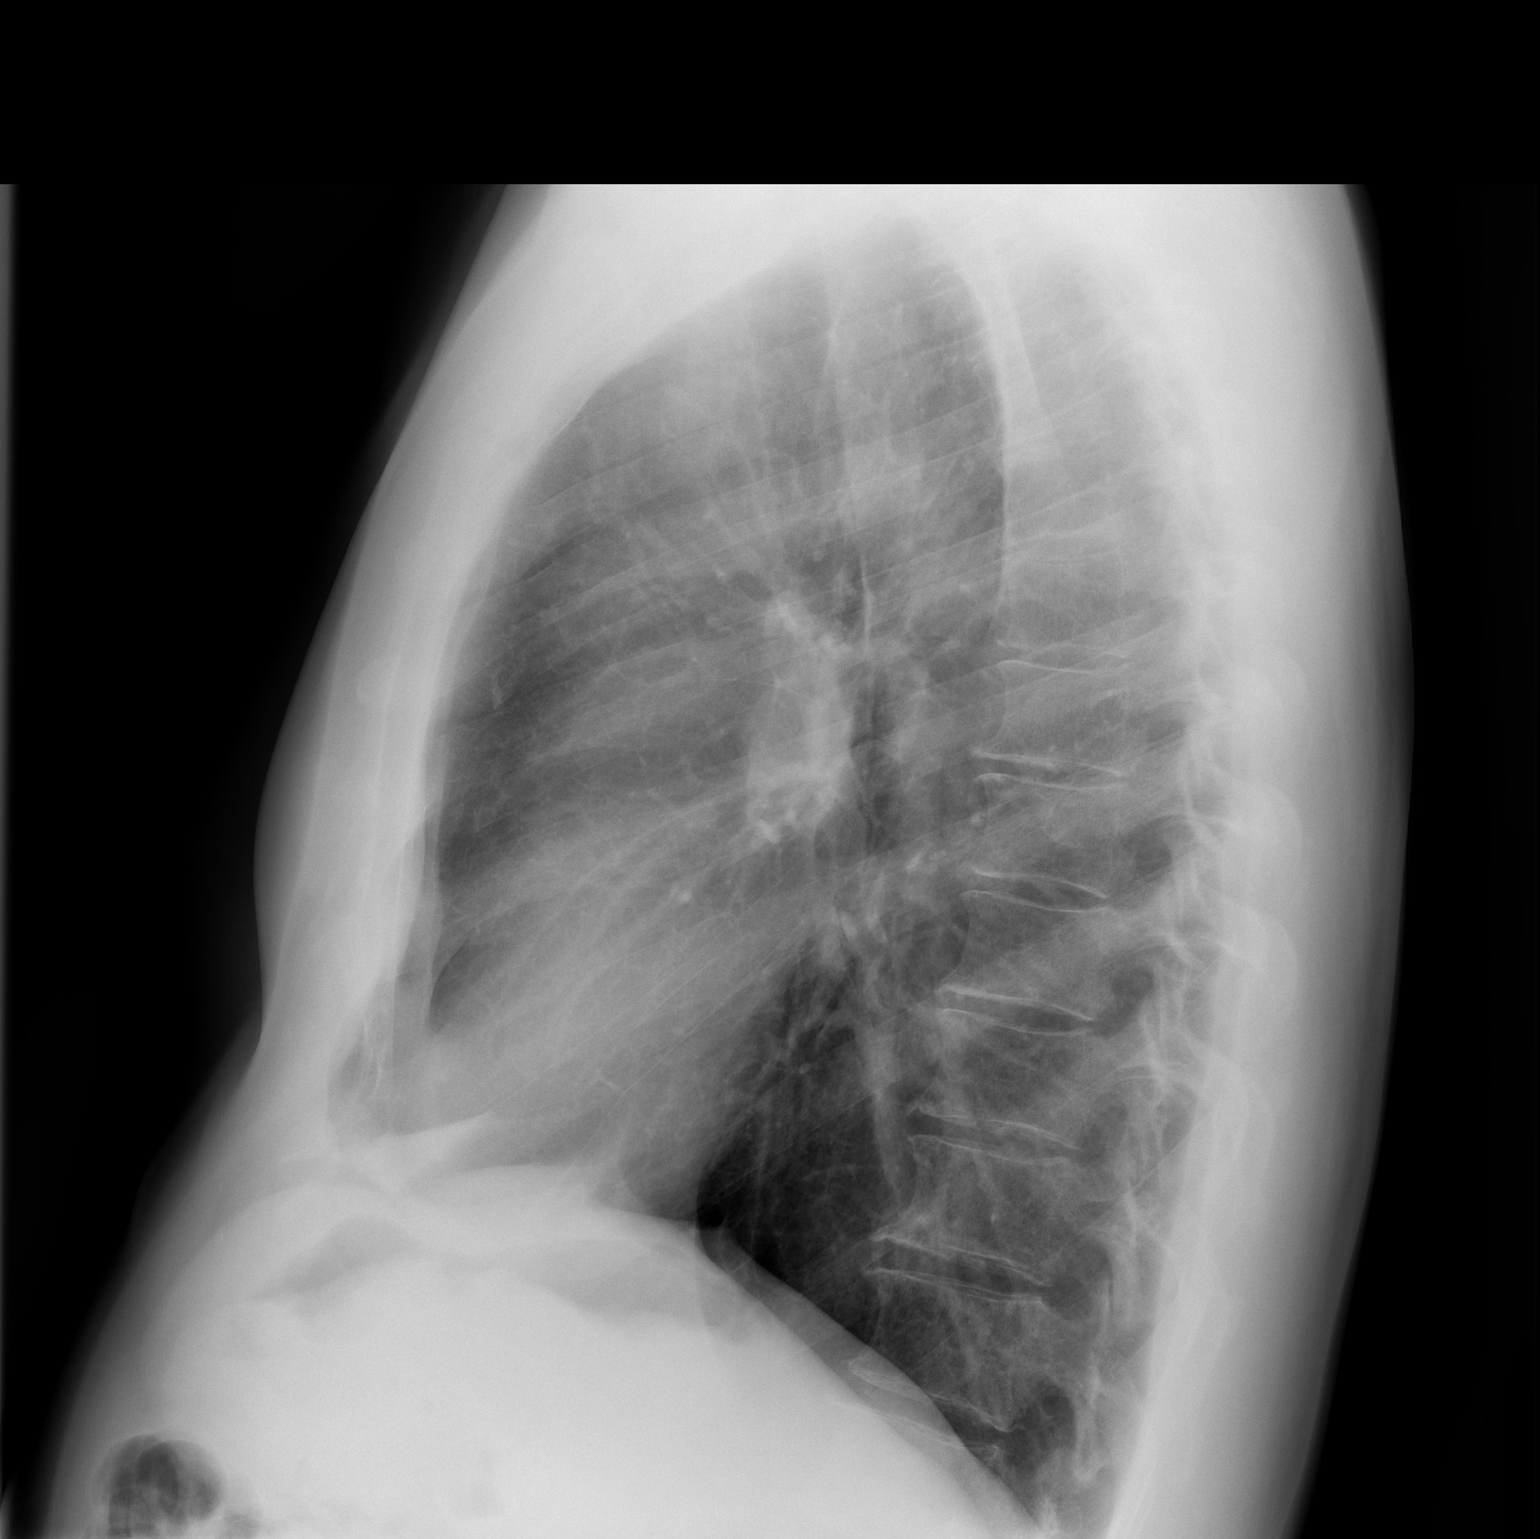

[2 of 2 positions shown; findings below may reference images not displayed]

FINDINGS: The heart size and mediastinal contours are within normal limits.
Aortic arch atherosclerotic calcifications. There hazy and
ground-glass right basilar opacities. No pneumothorax or pleural
effusion. No acute osseous abnormality. Multilevel spondylosis.
IMPRESSION: Hazy and ground-glass right basilar opacities. Differential includes
sequela of aspiration, viral etiology and atelectasis.

## 2020-12-12 ENCOUNTER — Other Ambulatory Visit: Payer: 59

## 2021-02-16 ENCOUNTER — Encounter: Payer: 59 | Admitting: Adult Health

## 2021-03-16 ENCOUNTER — Encounter: Payer: Self-pay | Admitting: Adult Health

## 2021-03-16 DIAGNOSIS — M1A9XX0 Chronic gout, unspecified, without tophus (tophi): Secondary | ICD-10-CM | POA: Insufficient documentation

## 2021-03-16 DIAGNOSIS — E663 Overweight: Secondary | ICD-10-CM | POA: Insufficient documentation

## 2021-03-16 NOTE — Progress Notes (Signed)
Complete Physical  Assessment and Plan:  Encounter for Annual Physical Exam with abnormal findings Due annually  Health Maintenance reviewed Healthy lifestyle reviewed and goals set  Atherosclerosis of aorta ([place]) - per CXR 02/2020 Control blood pressure, cholesterol, glucose, increase exercise.   Mixed diabetic hyperlipidemia associated with type 1 diabetes mellitus (HCC) -     Lipid panel check lipids, LDL goal <70 decrease fatty foods increase activity.   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 Keep close log with new CGM device to bring next OV  Erectile dysfunction associated with type 2 diabetes mellitus ([place]) On testosterone supplement, declines other meds -     Testosterone  Hypothyroidism, s/p thyroidectomy in 1988, hx of thyroid cancer continue medications the same pending lab results, plan to switch to 150 mcg daily rather than 300 mcg every other day reminded to take on an empty stomach 30-77mins before food.  -     TSH  Essential hypertension - continue medications, DASH diet, exercise and monitor at home. Call if greater than 130/80.  -     CBC with Differential/Platelet -     COMPLETE METABOLIC PANEL WITH GFR -     Urinalysis, Routine w reflex microscopic -     Microalbumin / creatinine urine ratio -     EKG 12-Lead  OSA on CPAP Sleep apnea- continue CPAP, CPAP is helping with daytime fatigue, weight loss still advised.   Medication management -     Magnesium  Vitamin D deficiency -     VITAMIN D 25 Hydroxy (Vit-D Deficiency, Fractures)  Testosterone deficiency Continued perceived  benefits with supplementation, no SE. Check level, recommended zinc supplement, no symptoms of elevated estrogen or dihydrotestosterone.  -     Testosterone  NEPHROLITHIASIS, HX OF Increase fluids, urology PRN  RENAL CYST Simple cyst per [place]  Gout Patient pref to stop low dose allopurinol Diet discussed Monitor levels closely, goal <6       -     Uric acid  Screening PSA (prostate specific antigen) -     PSA   Orders Placed This Encounter  Procedures   CBC with Differential/Platelet   COMPLETE METABOLIC PANEL WITH GFR   Magnesium   Lipid panel   TSH   Hemoglobin A1c   PSA   Testosterone   Microalbumin / creatinine urine ratio   Urinalysis, Routine w reflex microscopic   Uric acid   EKG 12-Lead   HM DIABETES FOOT EXAM     Discussed med's effects and SE's. Screening labs and tests as requested with regular follow-up as recommended. Over 40 minutes of exam, counseling, chart review and critical decision making was performed Future Appointments  Date Time Provider [place]  06/22/2021  9:30 AM [name], MD [facility] None  03/17/2022 10:00 AM [name], NP [facility] None    HPI 60 y.o. male patient presents for a complete physical.  He has Hypothyroidism; Erectile dysfunction associated with type 2 diabetes mellitus ([place]); RENAL CYST; OSA on CPAP; NEPHROLITHIASIS, HX OF; Testosterone deficiency; Mixed diabetic hyperlipidemia associated with type 1 diabetes mellitus ([place]); Hypertension; Medication management; Vitamin D deficiency; LADA (latent autoimmune diabetes in  adults), managed as type 1 ([place]); Aortic atherosclerosis ([place]) by CXR 03/11/2020; Overweight (BMI 25.0-29.9); and Chronic gout without tophus on their problem list.  He is married, 1 bio child and 2 step kids, 7 grandchildren.  He works as Occupational psychologist. No concerns today.   He has OSA on CPAP, endorses 100% compliance and no concerns.   BMI is Body mass index is 27.75  kg/m., he has been working on diet though admits not enough exercise, trying to use the gym at work more often. He reports 12 cups of coffee daily (unchanged since childhood), 4-6 glasses of water, 1 beer at night.  Wt Readings from Last 3 Encounters:  03/17/21 234 lb (106.1 kg)  11/07/20 236 lb (107 kg)  09/11/20 222 lb (100.7 kg)   His blood pressure has been controlled at home, today their BP is BP: 120/76 He does not workout but is active out side. He denies chest pain, shortness of breath, dizziness.   Former cigar smoker Q in 2014- VERY RARE USE. He has aortic atherosclerosis per CXR 02/2020.   He has been working on diet and exercise for diabetes due to LADA  metformin and insulin humalog 75/25 mix, 15 units AM, 15 units PM Has had issues with control, labile, recently was able to get CGM covered, will receive this week.  Denies foot ulcerations, hyperglycemia, increased appetite, nausea, paresthesia of the feet, polydipsia, polyuria, visual disturbances, vomiting and weight loss.  Lab Results  Component Value Date   HGBA1C 8.8 (H) 11/07/2020   Had Renal US 2011 showing simple R cyst and non-obstructing renal calculi. He had remoted by cysto/lithotripsy by Dr. Gaynelle Arabian. Last GFR: Lab Results  Component Value Date   GFRNONAA 87 11/07/2020   LDL is not at goal <70, he reports had aching with rosuvastatin 20 mg and stopped. Willing  Lab Results  Component Value Date   CHOL 148 11/07/2020   HDL 47 11/07/2020   LDLCALC 85 11/07/2020   LDLDIRECT 127.3 10/01/2009   TRIG 72 11/07/2020   CHOLHDL 3.1 11/07/2020   He is on thyroid medication s/p thyroidectomy following thyroid cancer in 88s. His medication was changed last visit, he reports has been taking 300 mcg every other day, taking with water only since last visit.  Lab Results  Component Value Date   TSH 0.04 (L) 11/07/2020  .  He has a history of testosterone deficiency and is on testosterone replacement. Taking 200 mg  every 2 weeks. He states that the testosterone helps with his energy, libido, muscle mass. Denies symptoms of elevated estrogen or dihydrotestosterone.  Lab Results  Component Value Date   TESTOSTERONE 596 07/07/2020    Patient is not taking Vitamin D supplement.   Lab Results  Component Value Date   VD25OH 40 07/07/2020     Last PSA was: Lab Results  Component Value Date   PSA 0.2 02/14/2020   Patient is on allopurinol 100 mg daily for gout and does not report a recent flare. He questions this diagnosis would like to try getting off med. Reports has never had flare. Has reduced beer intake.  Lab Results  Component Value Date   LABURIC 4.0 07/07/2020     Current Medications:   Current Outpatient Medications (Endocrine & Metabolic):    Insulin Lispro Prot & Lispro (HUMALOG MIX 75/25 KWIKPEN) (75-25) 100 UNIT/ML Kwikpen, INJECT SUBCUTANEOUSLY 20  UNITS WITH FIRST MEAL OF  THE DAY AND 10 UNITS WITH  LARGEST MEAL OF THE DAY  PRIOR TO AT BEDTIME   levothyroxine (SYNTHROID) 300 MCG tablet, Take 1/2 -1 tablet daily on an empty stomach with only water for 30 minutes & no Antacid meds, Calcium or Magnesium for 4 hours & avoid Biotin. Take 1 tab twice weekly and 1/2 tab 5 days/week.   metFORMIN (GLUCOPHAGE-XR) 500 MG 24 hr tablet, TAKE 1 TABLET WITH  BREAKFAST AND LUNCH, AND 2  TABLETS WITH SUPPER FOR  DIABETES   testosterone cypionate (DEPOTESTOSTERONE CYPIONATE) 200 MG/ML injection, INJECT 1 ML (200 mg) AS DIRECTED INTRAMUSCULARLY EVERY 14  DAYS  Current Outpatient Medications (Cardiovascular):    olmesartan (BENICAR) 20 MG tablet, Take 1 tablet (20 mg total) by mouth daily.   rosuvastatin (CRESTOR) 20 MG tablet, TAKE 1/2 TABLET BY MOUTH  EVERY OTHER DAY FOR CHOLESTEROL     Current Outpatient Medications (Other):    BD PEN NEEDLE NANO U/F 32G X 4 MM MISC, USE TWICE A DAY AS DIRECTED   CINNAMON PO, Take 1,000 mg by mouth.   Continuous Blood Gluc Sensor (FREESTYLE LIBRE 14 DAY SENSOR)  MISC, APPLY NEW SENSOR EVERY 14  DAYS TO CHECK BLOOD SUGAR 4 TIMES DAILY, TO MANAGE INSULIN.   Continuous Blood Gluc Sensor MISC, 1 each by Does not apply route as directed. Check blood sugar 3 times a day Dx:E10.9   glucose blood test strip, Check blood sugar 3 times a day-DX-E13.9.   NEEDLE, DISP, 21 G (BD ECLIPSE NEEDLE) 21G X 1" MISC, Use to inject 2 mL intramuscularly every 14 days.   pantoprazole (PROTONIX) 40 MG tablet, Take 1 tablet (40 mg total) by mouth daily.   SYRINGE-NEEDLE, DISP, 3 ML (BD ECLIPSE SYRINGE) 21G X 1" 3 ML MISC, Use to inject 2 mL intramuscularly every 14 days   Cholecalciferol (VITAMIN D PO), Take 4,000 Int'l Units by mouth daily. (Patient not taking: Reported on 03/17/2021)  Allergies:  Allergies  Allergen Reactions   Shellfish Allergy Anaphylaxis and Rash   Atorvastatin Other (See Comments)    Myalgias   Health Maintenance:  Immunization History  Administered Date(s) Administered   Influenza Inj Mdck Quad With Preservative 07/07/2020   Influenza Split 06/12/2013   Influenza Whole 06/30/2009   Influenza-Unspecified 06/25/2017, 06/26/2019   Moderna Sars-Covid-2 Vaccination 02/09/2020, 03/19/2020   PPD Test 10/29/2013, 10/30/2014, 11/12/2015   Td 01/08/2004   Tdap 10/29/2013   Tetanus: 2015 Influenza: 06/2020 Pneumonia: declines this visit  Shingrix: check with insurance Covid 19: 2/2, moderna, 2021 -   Colonoscopy: 2017 Dr. Earlean Shawl repeat 10 years  Last eye exam: eye provider at [facility], within the last year, needs report Last dental: last 2022, goes q23m  Patient Care Team: [name], MD as PCP - General (Internal Medicine)  Medical History:  has Hypothyroidism; Erectile dysfunction associated with type 2 diabetes mellitus ([place]); RENAL CYST; OSA on CPAP; NEPHROLITHIASIS, HX OF; Testosterone deficiency; Mixed diabetic hyperlipidemia associated with type 1 diabetes mellitus ([place]); Hypertension; Medication management; Vitamin D deficiency;  LADA (latent autoimmune diabetes in adults), managed as type 1 ([place]); Aortic atherosclerosis ([place]) by CXR 03/11/2020; Overweight (BMI 25.0-29.9); and Chronic gout without tophus on their problem list. Surgical History:  He  has a past surgical history that includes Thyroidectomy (2001); Cystoscopy/retrograde/ureteroscopy/stone extraction with basket (Right, 01/19/2016); Cystoscopy with holmium laser lithotripsy (Right, 01/19/2016); and Colonoscopy. Family History:  His family history includes Diabetes in his father; Diabetes (age of onset: 94) in his sister; Hypertension in his brother and father; Prostate cancer in his maternal grandfather and paternal grandfather; Thyroid  cancer in his paternal grandmother. Social History:   reports that he quit smoking about 7 years ago. His smoking use included cigars. He has never used smokeless tobacco. He reports current alcohol use of about 7.0 standard drinks of alcohol per week. He reports that he does not use drugs.  Review of Systems:  Review of Systems  Constitutional:  Negative for malaise/fatigue and weight loss.  HENT:  Negative for hearing loss and tinnitus.   Eyes:  Negative for blurred vision and double vision.  Respiratory:  Negative for cough, shortness of breath and wheezing.   Cardiovascular:  Negative for chest pain, palpitations, orthopnea, claudication and leg swelling.  Gastrointestinal:  Negative for abdominal pain, blood in stool, constipation, diarrhea, heartburn, melena, nausea and vomiting.  Genitourinary: Negative.   Musculoskeletal:  Negative for joint pain and myalgias.  Skin:  Negative for rash.  Neurological:  Negative for dizziness, tingling, sensory change, weakness and headaches.  Endo/Heme/Allergies:  Negative for polydipsia.  Psychiatric/Behavioral: Negative.    All other systems reviewed and are negative.  Physical Exam: Estimated body mass index is 27.75 kg/m as calculated from the following:   Height as of this  encounter: 6\' 5"  (1.956 m).   Weight as of this encounter: 234 lb (106.1 kg). BP 120/76   Pulse 74   Temp (!) 97.5 F (36.4 C)   Ht 6\' 5"  (1.956 m)   Wt 234 lb (106.1 kg)   SpO2 97%   BMI 27.75 kg/m  General Appearance: Well nourished, in no apparent distress.  Eyes: PERRLA, EOMs, conjunctiva no swelling or erythema, normal fundi and vessels.  Sinuses: No Frontal/maxillary tenderness  ENT/Mouth: Ext aud canals clear, normal light reflex with TMs without erythema, bulging. Good dentition. No erythema, swelling, or exudate on post pharynx. Tonsils not swollen or erythematous. Hearing normal.  Neck: Supple, thyroid normal. No bruits  Respiratory: Respiratory effort normal, BS equal bilaterally without rales, rhonchi, wheezing or stridor.  Cardio: RRR without murmurs, rubs or gallops. Brisk peripheral pulses without edema.  Chest: symmetric, with normal excursions and percussion.  Abdomen: Soft, nontender, no guarding, rebound, hernias, masses, or organomegaly.  Lymphatics: Non tender without lymphadenopathy.  Genitourinary: denies concerns Musculoskeletal: Full ROM all peripheral extremities,5/5 strength, and normal gait. + varicose veins bilateral legs, no erythema, hard cord, neg homen's Skin: Warm, dry without rashes, lesions, ecchymosis. Neuro: Cranial nerves intact, reflexes equal bilaterally. Normal muscle tone, no cerebellar symptoms. Sensation intact bil feet to monofilament.  Psych: Awake and oriented X 3, normal affect, Insight and Judgment appropriate.   EKG: Sinus bradycardia  Izora Ribas, NP 12:44 PM [facility]

## 2021-03-17 ENCOUNTER — Encounter: Payer: Self-pay | Admitting: Adult Health

## 2021-03-17 ENCOUNTER — Ambulatory Visit (INDEPENDENT_AMBULATORY_CARE_PROVIDER_SITE_OTHER): Payer: 59 | Admitting: Adult Health

## 2021-03-17 ENCOUNTER — Other Ambulatory Visit: Payer: Self-pay

## 2021-03-17 VITALS — BP 120/76 | HR 74 | Temp 97.5°F | Ht 77.0 in | Wt 234.0 lb

## 2021-03-17 DIAGNOSIS — E782 Mixed hyperlipidemia: Secondary | ICD-10-CM

## 2021-03-17 DIAGNOSIS — Z87442 Personal history of urinary calculi: Secondary | ICD-10-CM

## 2021-03-17 DIAGNOSIS — Z136 Encounter for screening for cardiovascular disorders: Secondary | ICD-10-CM | POA: Diagnosis not present

## 2021-03-17 DIAGNOSIS — N521 Erectile dysfunction due to diseases classified elsewhere: Secondary | ICD-10-CM

## 2021-03-17 DIAGNOSIS — M1A9XX0 Chronic gout, unspecified, without tophus (tophi): Secondary | ICD-10-CM

## 2021-03-17 DIAGNOSIS — Z0000 Encounter for general adult medical examination without abnormal findings: Secondary | ICD-10-CM

## 2021-03-17 DIAGNOSIS — E039 Hypothyroidism, unspecified: Secondary | ICD-10-CM

## 2021-03-17 DIAGNOSIS — I10 Essential (primary) hypertension: Secondary | ICD-10-CM

## 2021-03-17 DIAGNOSIS — N281 Cyst of kidney, acquired: Secondary | ICD-10-CM

## 2021-03-17 DIAGNOSIS — E1169 Type 2 diabetes mellitus with other specified complication: Secondary | ICD-10-CM

## 2021-03-17 DIAGNOSIS — Z79899 Other long term (current) drug therapy: Secondary | ICD-10-CM

## 2021-03-17 DIAGNOSIS — Z125 Encounter for screening for malignant neoplasm of prostate: Secondary | ICD-10-CM

## 2021-03-17 DIAGNOSIS — E1069 Type 1 diabetes mellitus with other specified complication: Secondary | ICD-10-CM

## 2021-03-17 DIAGNOSIS — E663 Overweight: Secondary | ICD-10-CM

## 2021-03-17 DIAGNOSIS — F419 Anxiety disorder, unspecified: Secondary | ICD-10-CM

## 2021-03-17 DIAGNOSIS — E559 Vitamin D deficiency, unspecified: Secondary | ICD-10-CM

## 2021-03-17 DIAGNOSIS — Z1389 Encounter for screening for other disorder: Secondary | ICD-10-CM

## 2021-03-17 DIAGNOSIS — Z0001 Encounter for general adult medical examination with abnormal findings: Secondary | ICD-10-CM

## 2021-03-17 DIAGNOSIS — E139 Other specified diabetes mellitus without complications: Secondary | ICD-10-CM

## 2021-03-17 DIAGNOSIS — G4733 Obstructive sleep apnea (adult) (pediatric): Secondary | ICD-10-CM

## 2021-03-17 DIAGNOSIS — E349 Endocrine disorder, unspecified: Secondary | ICD-10-CM

## 2021-03-17 DIAGNOSIS — I700 Atherosclerosis of aorta: Secondary | ICD-10-CM

## 2021-03-17 MED ORDER — ROSUVASTATIN CALCIUM 20 MG PO TABS: ORAL_TABLET | ORAL | 3 refills | Status: DC

## 2021-03-17 NOTE — Patient Instructions (Addendum)
  Daniel Case , Thank you for taking time to come for your AnnualWellness Visit. I appreciate your ongoing commitment to your health goals. Please review the following plan we discussed and let me know if I can assist you in the future.   These are the goals we discussed:  Goals      Blood Pressure < 130/80     HEMOGLOBIN A1C < 7     Peak Blood Glucose <130        This is a list of the screening recommended for you and due dates:  Health Maintenance  Topic Date Due   Pneumococcal vaccine  Never done   Pneumococcal Vaccination (1 - PCV) Never done   Eye exam for diabetics  Never done   Zoster (Shingles) Vaccine (1 of 2) Never done   COVID-19 Vaccine (3 - Moderna risk series) 04/16/2020   Flu Shot  03/30/2021   Hemoglobin A1C  05/10/2021   Complete foot exam   03/17/2022   Tetanus Vaccine  10/30/2023   Colon Cancer Screening  10/27/2025   Hepatitis C Screening: USPSTF Recommendation to screen - Ages 18-79 yo.  Completed   HIV Screening  Completed   HPV Vaccine  Aged Out     Check with insurance about shingrix   Please let me know know your eye doctor    Know what a healthy weight is for you (roughly BMI <25) and aim to maintain this  Aim for 7+ servings of fruits and vegetables daily  65-80+ fluid ounces of water or unsweet tea for healthy kidneys  Limit to max 1 drink of alcohol per day; avoid smoking/tobacco  Limit animal fats in diet for cholesterol and heart health - choose grass fed whenever available  Avoid highly processed foods, and foods high in saturated/trans fats  Aim for low stress - take time to unwind and care for your mental health  Aim for 150 min of moderate intensity exercise weekly for heart health, and weights twice weekly for bone health  Aim for 7-9 hours of sleep daily

## 2021-03-18 ENCOUNTER — Other Ambulatory Visit: Payer: Self-pay | Admitting: Adult Health

## 2021-03-18 LAB — COMPLETE METABOLIC PANEL WITH GFR
AG Ratio: 1.7 (calc) (ref 1.0–2.5)
ALT: 16 U/L (ref 9–46)
AST: 18 U/L (ref 10–35)
Albumin: 4.4 g/dL (ref 3.6–5.1)
Alkaline phosphatase (APISO): 61 U/L (ref 35–144)
BUN: 20 mg/dL (ref 7–25)
CO2: 29 mmol/L (ref 20–32)
Calcium: 9.4 mg/dL (ref 8.6–10.3)
Chloride: 104 mmol/L (ref 98–110)
Creat: 1.07 mg/dL (ref 0.70–1.35)
Globulin: 2.6 g/dL (calc) (ref 1.9–3.7)
Glucose, Bld: 257 mg/dL — ABNORMAL HIGH (ref 65–99)
Potassium: 4.7 mmol/L (ref 3.5–5.3)
Sodium: 139 mmol/L (ref 135–146)
Total Bilirubin: 0.5 mg/dL (ref 0.2–1.2)
Total Protein: 7 g/dL (ref 6.1–8.1)
eGFR: 79 mL/min/{1.73_m2} (ref >=60)

## 2021-03-18 LAB — CBC WITH DIFFERENTIAL/PLATELET
Absolute Monocytes: 504 cells/uL (ref 200–950)
Basophils Absolute: 48 cells/uL (ref 0–200)
Basophils Relative: 1 %
Eosinophils Absolute: 29 cells/uL (ref 15–500)
Eosinophils Relative: 0.6 %
HCT: 43 % (ref 38.5–50.0)
Hemoglobin: 14.6 g/dL (ref 13.2–17.1)
Lymphs Abs: 1157 cells/uL (ref 850–3900)
MCH: 32.2 pg (ref 27.0–33.0)
MCHC: 34 g/dL (ref 32.0–36.0)
MCV: 94.7 fL (ref 80.0–100.0)
MPV: 10 fL (ref 7.5–12.5)
Monocytes Relative: 10.5 %
Neutro Abs: 3062 cells/uL (ref 1500–7800)
Neutrophils Relative %: 63.8 %
Platelets: 245 10*3/uL (ref 140–400)
RBC: 4.54 10*6/uL (ref 4.20–5.80)
RDW: 12.1 % (ref 11.0–15.0)
Total Lymphocyte: 24.1 %
WBC: 4.8 10*3/uL (ref 3.8–10.8)

## 2021-03-18 LAB — TESTOSTERONE: Testosterone: 380 ng/dL (ref 250–827)

## 2021-03-18 LAB — HEMOGLOBIN A1C
Hgb A1c MFr Bld: 9.9 % of total Hgb — ABNORMAL HIGH (ref <5.7)
Mean Plasma Glucose: 237 mg/dL
eAG (mmol/L): 13.2 mmol/L

## 2021-03-18 LAB — URINALYSIS, ROUTINE W REFLEX MICROSCOPIC
Bilirubin Urine: NEGATIVE
Hgb urine dipstick: NEGATIVE
Ketones, ur: NEGATIVE
Leukocytes,Ua: NEGATIVE
Nitrite: NEGATIVE
Protein, ur: NEGATIVE
Specific Gravity, Urine: 1.028 (ref 1.001–1.035)
pH: 6 (ref 5.0–8.0)

## 2021-03-18 LAB — LIPID PANEL
Cholesterol: 224 mg/dL — ABNORMAL HIGH (ref <200)
HDL: 49 mg/dL (ref >=40)
LDL Cholesterol (Calc): 153 mg/dL (calc) — ABNORMAL HIGH
Non-HDL Cholesterol (Calc): 175 mg/dL (calc) — ABNORMAL HIGH (ref <130)
Total CHOL/HDL Ratio: 4.6 (calc) (ref <5.0)
Triglycerides: 108 mg/dL (ref <150)

## 2021-03-18 LAB — PSA: PSA: 0.16 ng/mL (ref <=4.00)

## 2021-03-18 LAB — URIC ACID: Uric Acid, Serum: 3.5 mg/dL — ABNORMAL LOW (ref 4.0–8.0)

## 2021-03-18 LAB — MAGNESIUM: Magnesium: 2 mg/dL (ref 1.5–2.5)

## 2021-03-18 LAB — MICROALBUMIN / CREATININE URINE RATIO
Creatinine, Urine: 124 mg/dL (ref 20–320)
Microalb Creat Ratio: 6 mcg/mg creat (ref <30)
Microalb, Ur: 0.8 mg/dL

## 2021-03-18 LAB — TSH: TSH: 0.99 mIU/L (ref 0.40–4.50)

## 2021-03-18 MED ORDER — LEVOTHYROXINE SODIUM 150 MCG PO TABS: ORAL_TABLET | ORAL | 3 refills | Status: DC

## 2021-03-19 ENCOUNTER — Other Ambulatory Visit: Payer: Self-pay | Admitting: Internal Medicine

## 2021-06-21 ENCOUNTER — Encounter: Payer: Self-pay | Admitting: Internal Medicine

## 2021-06-21 NOTE — Progress Notes (Signed)
Future Appointments  Date Time Provider [place]  06/22/2021  9:30 AM [name], MD [facility] None  03/17/2022 10:00 AM [name], NP [facility] None    History of Present Illness:       This very nice 60 y.o. MWM presents for 3 month follow up with HTN, HLD, T1_IDDM (LADA), Hypothyroidism and Vitamin D Deficiency.  Patient has OSA on CPAP with improved sleep hygiene. Patient has hx/o Gout in past & is off of meds.        Patient is treated for HTN since 2013 & BP has been controlled at home. Today's BP is art goal - 138/80. Patient has had no complaints of any cardiac type chest pain, palpitations, dyspnea / orthopnea / PND, dizziness, claudication, or dependent edema.       Hyperlipidemia is not controlled with diet & patient was off meds at last OV . Patient denies myalgias or other med SE's. Last Lipids off meds were not at goal:  Lab Results  Component Value Date   CHOL 224 (H) 03/17/2021   HDL 49 03/17/2021   LDLCALC 153 (H) 03/17/2021   LDLDIRECT 127.3 10/01/2009   TRIG 108 03/17/2021   CHOLHDL 4.6 03/17/2021     Also, the patient   has moderate Obesity (BMI 34+)  and history of    hx prediabetes (A1c 5.7% /2011) and then dx'd with  LADA /T2 _IDDM (  ) w/CKD2  (GFR 79)  initiating Insulin in March 2017.   Patient currently is on Metformin & Humalog Mis 75/25 bid/.  Patient denies symptoms of reactive hypoglycemia, diabetic polys, paresthesias or visual blurring.  Last A1c was  not at goal:  Lab Results  Component Value Date   HGBA1C 9.9 (H) 03/17/2021                                                        In 2001,  Patient underwent Thyroidectomy for Thyroid Cancer and is on supressive /replacement therapy.  Patient also has hx/o Low Testosterone & is on replacement with improved sense of well-being.                                                                                                                                     Further,  the patient also has history of Vitamin D Deficiency ("11" /2011) and supplements  very sporadically takes recommended vitamin D. Last vitamin D was very low:   Lab Results  Component Value Date   VD25OH 40 07/07/2020     Current Outpatient Medications on File Prior to Visit  Medication Sig   VITAMIN D  Take 4,000 Int'l Units by mouth daily. (Patient not taking: Reported on 03/17/2021)  CINNAMON 1,000 mg  Take    glucose blood test strip Check blood sugar 3 times a day-DX-E13.9.   HUMALOG MIX 75/25 KWIKPEN INJECT  20  u. w/1st meal  &10 u.  2sd MEAL    levothyroxine 150 MCG tablet Take 1 tablet daily    metFORMIN-XR 500 MG  Take 1 tab w/Bkfst & Lunch & 2 tabs w/supper   olmesartan 20 MG tablet Take 1 tablet daily.   pantoprazole 40 MG tablet Take 1 tablet  daily.   rosuvastatin 20 MG tablet Take 1/2 tab every other day    testosterone cypio 200 MG/ML injec Inject 1 ml ( 200 mg ) IM every 14 days    Allergies  Allergen Reactions   Shellfish Allergy Anaphylaxis and Rash   Atorvastatin Other (See Comments)    Myalgias     PMHx:   Past Medical History:  Diagnosis Date   Allergy    Anxiety    Cancer ([place])    thyroid   Complication of anesthesia 2001   panic attack when going under anesthesia   COVID-19 (09/09/2020) 09/11/2020   Diabetes mellitus without complication ([place])    type 2   Heart murmur    as a teen   Hyperlipidemia    Hypothyroidism    Kidney stones    Other testicular hypofunction    PONV (postoperative nausea and vomiting)    Sleep apnea      Immunization History  Administered Date(s) Administered   Influenza Inj Mdck Quad With Preservative 07/07/2020   Influenza Split 06/12/2013   Influenza Whole 06/30/2009   Influenza-Unspecified 06/25/2017, 06/26/2019   Moderna Sars-Covid-2 Vaccination 02/09/2020, 03/19/2020   PPD Test 10/29/2013, 10/30/2014, 11/12/2015   Td 01/08/2004   Tdap 10/29/2013     Past Surgical History:  Procedure Laterality Date    COLONOSCOPY     CYSTOSCOPY WITH HOLMIUM LASER LITHOTRIPSY Right 01/19/2016   Procedure: CYSTOSCOPY WITH HOLMIUM LASER LITHOTRIPSY;  Surgeon: Carolan Clines, MD;  Location: [facility];  Service: Urology;  Laterality: Right;   CYSTOSCOPY/RETROGRADE/URETEROSCOPY/STONE EXTRACTION WITH BASKET Right 01/19/2016   Procedure: CYSTO/RIGHT RETROGRADE PYELOGRAM/URETEROSCOPY/STONE EXTRACTION WITH BASKET AND STENT PLACEMENT;  Surgeon: Carolan Clines, MD;  Location: [facility];  Service: Urology;  Laterality: Right;   THYROIDECTOMY  2001    FHx:    Reviewed / unchanged  SHx:    Reviewed / unchanged   Systems Review:  Constitutional: Denies fever, chills, wt changes, headaches, insomnia, fatigue, night sweats, change in appetite. Eyes: Denies redness, blurred vision, diplopia, discharge, itchy, watery eyes.  ENT: Denies discharge, congestion, post nasal drip, epistaxis, sore throat, earache, hearing loss, dental pain, tinnitus, vertigo, sinus pain, snoring.  CV: Denies chest pain, palpitations, irregular heartbeat, syncope, dyspnea, diaphoresis, orthopnea, PND, claudication or edema. Respiratory: denies cough, dyspnea, DOE, pleurisy, hoarseness, laryngitis, wheezing.  Gastrointestinal: Denies dysphagia, odynophagia, heartburn, reflux, water brash, abdominal pain or cramps, nausea, vomiting, bloating, diarrhea, constipation, hematemesis, melena, hematochezia  or hemorrhoids. Genitourinary: Denies dysuria, frequency, urgency, nocturia, hesitancy, discharge, hematuria or flank pain. Musculoskeletal: Denies arthralgias, myalgias, stiffness, jt. swelling, pain, limping or strain/sprain.  Skin: Denies pruritus, rash, hives, warts, acne, eczema or change in skin lesion(s). Neuro: No weakness, tremor, incoordination, spasms, paresthesia or pain. Psychiatric: Denies confusion, memory loss or sensory loss. Endo: Denies change in weight, skin or hair change.  Heme/Lymph: No excessive bleeding, bruising or enlarged  lymph nodes.  Physical Exam  BP 138/80   Pulse 64   Temp 97.8 F (36.6 C)   Resp  16   Ht 6\' 5"  (1.956 m)   Wt 242 lb 6.4 oz (110 kg)   SpO2 98%   BMI 28.74 kg/m   Appears  well nourished, well groomed  and in no distress.  Eyes: PERRLA, EOMs, conjunctiva no swelling or erythema. Sinuses: No frontal/maxillary tenderness ENT/Mouth: EAC's clear, TM's nl w/o erythema, bulging. Nares clear w/o erythema, swelling, exudates. Oropharynx clear without erythema or exudates. Oral hygiene is good. Tongue normal, non obstructing. Hearing intact.  Neck: Supple. Thyroid not palpable. Car 2+/2+ without bruits, nodes or JVD. Chest: Respirations nl with BS clear & equal w/o rales, rhonchi, wheezing or stridor.  Cor: Heart sounds normal w/ regular rate and rhythm without sig. murmurs, gallops, clicks or rubs. Peripheral pulses normal and equal  without edema.  Abdomen: Soft & bowel sounds normal. Non-tender w/o guarding, rebound, hernias, masses or organomegaly.  Lymphatics: Unremarkable.  Musculoskeletal: Full ROM all peripheral extremities, joint stability, 5/5 strength and normal gait.  Skin: Warm, dry without exposed rashes, lesions or ecchymosis apparent.  Neuro: Cranial nerves intact, reflexes equal bilaterally. Sensory-motor testing grossly intact. Tendon reflexes grossly intact.  Pysch: Alert & oriented x 3.  Insight and judgement nl & appropriate. No ideations.  Assessment and Plan:  1. Essential hypertension  - Continue medication, monitor blood pressure at home.  - Continue DASH diet.  Reminder to go to the ER if any CP,  SOB, nausea, dizziness, severe HA, changes vision/speech.   - CBC with Differential/Platelet - COMPLETE METABOLIC PANEL WITH GFR - Magnesium - TSH  2. Mixed diabetic hyperlipidemia associated with type 1 diabetes mellitus (HCC)  - Continue diet/meds, exercise,& lifestyle modifications.  - Continue monitor periodic cholesterol/liver & renal functions    -  Lipid panel - TSH  3. Type 2 diabetes mellitus with stage 2 chronic kidney  disease, with long-term current use of insulin (HCC)  - Discussed with patient increasing his insulin as needed to control his blood sugars  - Hemoglobin A1c  4. LADA (latent autoimmune diabetes in adults), managed as type 1 ([place])  - Continue diet, exercise  - Lifestyle modifications.  - Monitor appropriate labs   - Hemoglobin A1c  5. Vitamin D deficiency  - Continue supplementation   - VITAMIN D 25 Hydroxy   6. Testosterone deficiency  - Testosterone  7. Hypothyroidism, unspecified type  - TSH  8. Aortic atherosclerosis ([place]) by CXR 03/11/2020  - Lipid panel  9. Idiopathic gout, hx  - Uric acid  10. Medication management  - CBC with Differential/Platelet - COMPLETE METABOLIC PANEL WITH GFR - Magnesium - Lipid panel - TSH - Hemoglobin A1c - Insulin, random - Uric acid - Testosterone - VITAMIN D 25 Hydroxy          Discussed  regular exercise, BP monitoring, weight control to achieve/maintain BMI less than 25 and discussed med and SE's. Recommended labs to assess and monitor clinical status with further disposition pending results of labs.  I discussed the assessment and treatment plan with the patient. The patient was provided an opportunity to ask questions and all were answered. The patient agreed with the plan and demonstrated an understanding of the instructions.  I provided over 30 minutes of exam, counseling, chart review and  complex critical decision making.        The patient was advised to call back or seek an in-person evaluation if the symptoms worsen or if the condition fails to improve as anticipated.   Kirtland Bouchard, MD

## 2021-06-21 NOTE — Patient Instructions (Signed)

## 2021-06-22 ENCOUNTER — Ambulatory Visit: Payer: 59 | Admitting: Internal Medicine

## 2021-06-22 ENCOUNTER — Other Ambulatory Visit: Payer: Self-pay

## 2021-06-22 VITALS — BP 138/80 | HR 64 | Temp 97.8°F | Resp 16 | Ht 77.0 in | Wt 242.4 lb

## 2021-06-22 DIAGNOSIS — E1122 Type 2 diabetes mellitus with diabetic chronic kidney disease: Secondary | ICD-10-CM | POA: Diagnosis not present

## 2021-06-22 DIAGNOSIS — Z79899 Other long term (current) drug therapy: Secondary | ICD-10-CM

## 2021-06-22 DIAGNOSIS — E039 Hypothyroidism, unspecified: Secondary | ICD-10-CM

## 2021-06-22 DIAGNOSIS — Z23 Encounter for immunization: Secondary | ICD-10-CM | POA: Diagnosis not present

## 2021-06-22 DIAGNOSIS — E782 Mixed hyperlipidemia: Secondary | ICD-10-CM

## 2021-06-22 DIAGNOSIS — I10 Essential (primary) hypertension: Secondary | ICD-10-CM | POA: Diagnosis not present

## 2021-06-22 DIAGNOSIS — E559 Vitamin D deficiency, unspecified: Secondary | ICD-10-CM

## 2021-06-22 DIAGNOSIS — E1069 Type 1 diabetes mellitus with other specified complication: Secondary | ICD-10-CM | POA: Diagnosis not present

## 2021-06-22 DIAGNOSIS — I700 Atherosclerosis of aorta: Secondary | ICD-10-CM

## 2021-06-22 DIAGNOSIS — E139 Other specified diabetes mellitus without complications: Secondary | ICD-10-CM

## 2021-06-22 DIAGNOSIS — E349 Endocrine disorder, unspecified: Secondary | ICD-10-CM

## 2021-06-22 DIAGNOSIS — N182 Chronic kidney disease, stage 2 (mild): Secondary | ICD-10-CM

## 2021-06-22 DIAGNOSIS — Z794 Long term (current) use of insulin: Secondary | ICD-10-CM

## 2021-06-22 DIAGNOSIS — M1000 Idiopathic gout, unspecified site: Secondary | ICD-10-CM

## 2021-06-22 MED ORDER — INSULIN GLARGINE 100 UNITS/ML SOLOSTAR PEN: PEN_INJECTOR | SUBCUTANEOUS | 3 refills | Status: DC

## 2021-06-22 MED ORDER — EZETIMIBE 10 MG PO TABS: ORAL_TABLET | ORAL | 3 refills | Status: DC

## 2021-06-23 ENCOUNTER — Other Ambulatory Visit: Payer: Self-pay | Admitting: Internal Medicine

## 2021-06-23 LAB — CBC WITH DIFFERENTIAL/PLATELET
Absolute Monocytes: 499 cells/uL (ref 200–950)
Basophils Absolute: 81 cells/uL (ref 0–200)
Basophils Relative: 1.4 %
Eosinophils Absolute: 29 cells/uL (ref 15–500)
Eosinophils Relative: 0.5 %
HCT: 47.4 % (ref 38.5–50.0)
Hemoglobin: 15.8 g/dL (ref 13.2–17.1)
Lymphs Abs: 1108 cells/uL (ref 850–3900)
MCH: 32.3 pg (ref 27.0–33.0)
MCHC: 33.3 g/dL (ref 32.0–36.0)
MCV: 96.9 fL (ref 80.0–100.0)
MPV: 9.5 fL (ref 7.5–12.5)
Monocytes Relative: 8.6 %
Neutro Abs: 4083 cells/uL (ref 1500–7800)
Neutrophils Relative %: 70.4 %
Platelets: 232 10*3/uL (ref 140–400)
RBC: 4.89 10*6/uL (ref 4.20–5.80)
RDW: 12 % (ref 11.0–15.0)
Total Lymphocyte: 19.1 %
WBC: 5.8 10*3/uL (ref 3.8–10.8)

## 2021-06-23 LAB — LIPID PANEL
Cholesterol: 222 mg/dL — ABNORMAL HIGH (ref <200)
HDL: 42 mg/dL (ref >=40)
LDL Cholesterol (Calc): 154 mg/dL (calc) — ABNORMAL HIGH
Non-HDL Cholesterol (Calc): 180 mg/dL (calc) — ABNORMAL HIGH (ref <130)
Total CHOL/HDL Ratio: 5.3 (calc) — ABNORMAL HIGH (ref <5.0)
Triglycerides: 131 mg/dL (ref <150)

## 2021-06-23 LAB — COMPLETE METABOLIC PANEL WITH GFR
AG Ratio: 1.7 (calc) (ref 1.0–2.5)
ALT: 12 U/L (ref 9–46)
AST: 14 U/L (ref 10–35)
Albumin: 4.3 g/dL (ref 3.6–5.1)
Alkaline phosphatase (APISO): 48 U/L (ref 35–144)
BUN: 18 mg/dL (ref 7–25)
CO2: 29 mmol/L (ref 20–32)
Calcium: 9.6 mg/dL (ref 8.6–10.3)
Chloride: 100 mmol/L (ref 98–110)
Creat: 1.25 mg/dL (ref 0.70–1.35)
Globulin: 2.6 g/dL (calc) (ref 1.9–3.7)
Glucose, Bld: 228 mg/dL — ABNORMAL HIGH (ref 65–99)
Potassium: 4.8 mmol/L (ref 3.5–5.3)
Sodium: 135 mmol/L (ref 135–146)
Total Bilirubin: 0.7 mg/dL (ref 0.2–1.2)
Total Protein: 6.9 g/dL (ref 6.1–8.1)
eGFR: 66 mL/min/{1.73_m2} (ref >=60)

## 2021-06-23 LAB — TESTOSTERONE: Testosterone: 516 ng/dL (ref 250–827)

## 2021-06-23 LAB — HEMOGLOBIN A1C
Hgb A1c MFr Bld: 8.8 % of total Hgb — ABNORMAL HIGH (ref <5.7)
Mean Plasma Glucose: 206 mg/dL
eAG (mmol/L): 11.4 mmol/L

## 2021-06-23 LAB — INSULIN, RANDOM: Insulin: 7.2 u[IU]/mL

## 2021-06-23 LAB — URIC ACID: Uric Acid, Serum: 4 mg/dL (ref 4.0–8.0)

## 2021-06-23 LAB — MAGNESIUM: Magnesium: 2 mg/dL (ref 1.5–2.5)

## 2021-06-23 LAB — TSH: TSH: 6.31 mIU/L — ABNORMAL HIGH (ref 0.40–4.50)

## 2021-06-23 NOTE — Progress Notes (Signed)
============================================================ ============================================================  -    Glucose = 228 - elevated  ============================================================ ============================================================  -   -  Total  Chol =   222      - Elevated             (  Ideal  or  Goal is less than 180  !  )   - and   -  Bad / Dangerous LDL  Chol =   154   - also  Very Elevated  !             (  Ideal  or  Goal is less than 70  !  )   - New med  - Zetia  / Ezetimibe sent in  - Also Diet extremely important    - Recommend stricter low cholesterol diet   - Cholesterol only comes from animal sources  - ie. meat, dairy, egg yolks  - Eat all the vegetables you want.  - Avoid meat, especially red meat - Beef AND Pork .  - Avoid cheese & dairy - milk & ice cream.     - Cheese is the most concentrated form of trans-fats which  is the worst thing to clog up our arteries.   - Veggie cheese is OK which can be found in the fresh  produce section at [employer] or [employer] or [employer] ============================================================ ============================================================  -     Also  recommend you try eating  # 4 [place] nuts Once  /week  ============================================================ ============================================================  -  A1c down from 9.9% to now 8.8% - slightly better , but still way to high   - Please increase insulin doses as discussed  &                                     start the "basal insulin"  ~ Lantus at night as discussed  ============================================================ ============================================================  -  Uric Acid / Gout test normal & OK ============================================================ ============================================================  -  Testosterone Level  is Normal  ============================================================ ============================================================  -  All Else - CBC - Kidneys - Electrolytes - Liver - Magnesium & Thyroid    - all  Normal / OK ============================================================ ============================================================

## 2021-06-29 ENCOUNTER — Other Ambulatory Visit: Payer: Self-pay

## 2021-06-29 DIAGNOSIS — E139 Other specified diabetes mellitus without complications: Secondary | ICD-10-CM

## 2021-06-29 DIAGNOSIS — Z794 Long term (current) use of insulin: Secondary | ICD-10-CM

## 2021-06-29 DIAGNOSIS — N182 Chronic kidney disease, stage 2 (mild): Secondary | ICD-10-CM

## 2021-06-29 MED ORDER — INSULIN GLARGINE 100 UNITS/ML SOLOSTAR PEN: PEN_INJECTOR | SUBCUTANEOUS | 3 refills | Status: DC

## 2021-07-02 ENCOUNTER — Other Ambulatory Visit: Payer: Self-pay

## 2021-07-02 MED ORDER — EZETIMIBE 10 MG PO TABS: ORAL_TABLET | ORAL | 3 refills | Status: DC

## 2021-07-30 ENCOUNTER — Ambulatory Visit: Payer: 59 | Admitting: Internal Medicine

## 2021-09-29 ENCOUNTER — Other Ambulatory Visit: Payer: Self-pay | Admitting: Adult Health

## 2021-09-29 DIAGNOSIS — E11649 Type 2 diabetes mellitus with hypoglycemia without coma: Secondary | ICD-10-CM

## 2021-10-07 NOTE — Progress Notes (Deleted)
Patient ID: Daniel Case, male   DOB: 10/31/1960, 61 y.o.   MRN: [id number]  Assessment and Plan:   Hypertension:  -Continue medication  -monitor blood pressure at home.  -  goal is <130/80 -Continue DASH diet.   -Reminder to go to the ER if any CP, SOB, nausea, dizziness, severe HA, changes vision/speech, left arm numbness and tingling, and jaw pain.  Hyperlipidemia associated with diabetes  - LDL goal <70; at goal; continue statin  -Continue diet and exercise.  -Check cholesterol.   Diabetes due to LADA - with episodic hypoglycemia and poor control, insulin dependent  -Continue diet and exercise. Recommended low carb, small, frequent meals - concern with labile blood glucose are improved with continuous glucose monitor - taking insulin 15 units AM and PM, reports had more hypoglycemia and and high AM glucose with 20/10 - restart continuous glucose monitoring with frequent checks for tighter control  - Then titrate diet or insulin as needed for fasting glucose <130 -Check A1C  CKD II due to diabetes -Increase fluids, avoid NSAIDS, monitor sugars, will monitor  Vitamin D Def: - back on supplement - goal 60-100 -defer level today   Hypothyroidism -check TSH level, continue medications the same, reminded to take on an empty stomach (needs to be 30 min prior to coffee with water only)   Gout - Continue allopurinol - Diet discussed; Check uric acid as needed  Testosterone deficiency Continued perceived benefits with supplementation, no SE. Resent to restart; next OV hold shot prior to appointment; ideally want to check at least 7 days from last; no symptoms of elevated estrogen or dihydrotestosterone.    Continue diet and meds as discussed. Further disposition pending results of labs.  Future Appointments  Date Time Provider [place]  10/08/2021  9:30 AM Magda Bernheim, NP [facility] None  03/17/2022 10:00 AM [name], NP [facility] None    HPI 61 y.o. male   presents for 3 month follow up with hypertension, hyperlipidemia, T2 diabetes with CKD, hypothyroid, gout, testosterone def and vitamin D.  He has OSA on CPAP endorses 100% with restorative sleep.   He reports has been having trouble with mail order pharmacy - has been out of several meds, resent everything today.   BMI is There is no height or weight on file to calculate BMI., he has been working on diet but admits limited exercise. Previously was going to gym and hiking, stopped with pandemic, needs to restart, work gym opening back up in April.  He does watch portions for diet and trying to follow Dr. Burman Nieves dietary guidelines (high veggies with low larb, low animal protein).  He checks weight at home Wt Readings from Last 3 Encounters:  06/22/21 242 lb 6.4 oz (110 kg)  03/17/21 234 lb (106.1 kg)  11/07/20 236 lb (107 kg)     His blood pressure has been controlled at home (120-130/low 70s), today their BP is  .    He does workout. He denies chest pain, shortness of breath, dizziness.   He is on cholesterol medication (on rosuvastatin 40 mg daily) and denies myalgias. His cholesterol is at goal. The cholesterol last visit was:   Lab Results  Component Value Date   CHOL 222 (H) 06/22/2021   HDL 42 06/22/2021   LDLCALC 154 (H) 06/22/2021   LDLDIRECT 127.3 10/01/2009   TRIG 131 06/22/2021   CHOLHDL 5.3 (H) 06/22/2021     He has been working on diet and exercise for diabetes due to  LADA, patient now on metformin 2000 mg daily and humalog 75/25 mix, reports has been taking 15 units AM, 15 units PM, and denies foot ulcerations, hyperglycemia, increased appetite, nausea, paresthesia of the feet, polydipsia, polyuria, visual disturbances, vomiting and weight loss. He is on ACEi, statin; Reports did have diabetes eye exam, my eye doctor at [place] - last ? 06/2020  He was reporting numerous episodes of hypoglycemia at various times of the day, as low as 38, continuous glucose monitoring  device (freestyle [place]) was ordered with instructions to keep close food log. He reports since covid 19 infection in Feb, glucose has been persistently running high. Has been unable to get sensors due to needing prior auth  Checks fasting: recently 200-260 Checks prior to bedtime: recently 130-180.  Typically reports that bedtime glucose is lower than fasting/AM  Last A1C in the office was:  Lab Results  Component Value Date   HGBA1C 8.8 (H) 06/22/2021    He has CKD II associated with diabetes monitored at this office:  Lab Results  Component Value Date   GFRNONAA 87 11/07/2020   Patient is on Vitamin D supplement, was off of supplement, back on 4000 IU Lab Results  Component Value Date   VD25OH 40 07/07/2020     Patient underwent Thyroidectomy in 2001 for Thyroid Cancer and is on supressive and replacement therapy.  His medication was not changed last visit as he reported was taking with coffee rather than water on empty stomach. He admits still drinking coffee with splash of milk on the way to work.   Thyroxine 300 mcg - 1 tablet 5 x /week And 1/2 tab on Tues/Thurs. Lab Results  Component Value Date   TSH 6.31 (H) 06/22/2021   He has a history of testosterone deficiency and is on testosterone replacement, was on topical therapy but was recently transitioned back to injection with perceived improved benefit, taking 200 mg every 2 weeks, last injection was 7 weeks ago due to being out of needles. He states that the testosterone helps with his energy, libido, muscle mass. Lab Results  Component Value Date   TESTOSTERONE 516 06/22/2021     Current Medications:  Current Outpatient Medications on File Prior to Visit  Medication Sig Dispense Refill   BD PEN NEEDLE NANO U/F 32G X 4 MM MISC USE TWICE A DAY AS DIRECTED  11   Cholecalciferol (VITAMIN D PO) Take 4,000 Int'l Units by mouth daily. (Patient not taking: No sig reported)     CINNAMON PO Take 1,000 mg by mouth.      Continuous Blood Gluc Sensor (FREESTYLE LIBRE 14 DAY SENSOR) MISC APPLY NEW SENSOR EVERY 14  DAYS TO CHECK BLOOD SUGAR 4 TIMES DAILY TO MANAGE  INSULIN 7 each 3   Continuous Blood Gluc Sensor MISC 1 each by Does not apply route as directed. Check blood sugar 3 times a day Dx:E10.9 1 each 0   ezetimibe (ZETIA) 10 MG tablet Take  1 tablet  Daily  for Cholesterol 90 tablet 3   glucose blood test strip Check blood sugar 3 times a day-DX-E13.9. 100 each 6   insulin glargine (LANTUS) 100 unit/mL SOPN Inject  50 units at Bedtime for Diabetes 15 mL 3   Insulin Lispro Prot & Lispro (HUMALOG MIX 75/25 KWIKPEN) (75-25) 100 UNIT/ML Kwikpen INJECT SUBCUTANEOUSLY 20  UNITS WITH FIRST MEAL OF  THE DAY AND 10 UNITS WITH  LARGEST MEAL OF THE DAY  PRIOR TO BEDTIME 30 mL 3  levothyroxine (SYNTHROID) 150 MCG tablet Take 1 tablet daily on an empty stomach with only water for 30 minutes & no Antacid meds, Calcium or Magnesium for 4 hours & avoid Biotin. 90 tablet 3   metFORMIN (GLUCOPHAGE-XR) 500 MG 24 hr tablet TAKE 1 TABLET WITH  BREAKFAST AND LUNCH, AND 2  TABLETS WITH SUPPER FOR  DIABETES 360 tablet 3   NEEDLE, DISP, 21 G (BD ECLIPSE NEEDLE) 21G X 1" MISC Use to inject 2 mL intramuscularly every 14 days. 100 each 0   olmesartan (BENICAR) 20 MG tablet Take 1 tablet (20 mg total) by mouth daily. 90 tablet 3   pantoprazole (PROTONIX) 40 MG tablet Take 1 tablet (40 mg total) by mouth daily. 90 tablet 3   rosuvastatin (CRESTOR) 20 MG tablet TAKE 1/2 TABLET BY MOUTH  EVERY OTHER DAY FOR CHOLESTEROL (Patient not taking: Reported on 06/22/2021) 90 tablet 3   SYRINGE-NEEDLE, DISP, 3 ML (BD ECLIPSE SYRINGE) 21G X 1" 3 ML MISC Use to inject 2 mL intramuscularly every 14 days 100 each 0   testosterone cypionate (DEPOTESTOSTERONE CYPIONATE) 200 MG/ML injection INJECT 1 ML (200 mg) AS DIRECTED INTRAMUSCULARLY EVERY 14  DAYS 12 mL 0   No current facility-administered medications on file prior to visit.    Medical History:  Past  Medical History:  Diagnosis Date   Allergy    Anxiety    Cancer ([place])    thyroid   Complication of anesthesia 2001   panic attack when going under anesthesia   COVID-19 (09/09/2020) 09/11/2020   Diabetes mellitus without complication (HCC)    type 2   Heart murmur    as a teen   Hyperlipidemia    Hypothyroidism    Kidney stones    Other testicular hypofunction    PONV (postoperative nausea and vomiting)    Sleep apnea     Allergies:  Allergies  Allergen Reactions   Shellfish Allergy Anaphylaxis and Rash   Atorvastatin Other (See Comments)    Myalgias     Allergies:  Allergies  Allergen Reactions   Shellfish Allergy Anaphylaxis and Rash   Atorvastatin Other (See Comments)    Myalgias   Surgical History:  He  has a past surgical history that includes Thyroidectomy (2001); Cystoscopy/retrograde/ureteroscopy/stone extraction with basket (Right, 01/19/2016); Cystoscopy with holmium laser lithotripsy (Right, 01/19/2016); and Colonoscopy. Family History:  Hisfamily history includes Diabetes in his father; Diabetes (age of onset: 72) in his sister; Hypertension in his brother and father; Prostate cancer in his maternal grandfather and paternal grandfather; Thyroid cancer in his paternal grandmother. Social History:   reports that he quit smoking about 8 years ago. His smoking use included cigars. He has never used smokeless tobacco. He reports current alcohol use of about 7.0 standard drinks per week. He reports that he does not use drugs.   Review of Systems:  Review of Systems  Constitutional:  Negative for chills, fever, malaise/fatigue and weight loss.  HENT:  Negative for congestion, ear pain, hearing loss, sore throat and tinnitus.   Eyes: Negative.  Negative for blurred vision and double vision.  Respiratory:  Negative for cough, shortness of breath and wheezing.   Cardiovascular:  Negative for chest pain, palpitations, orthopnea, claudication and leg swelling.   Gastrointestinal:  Negative for abdominal pain, blood in stool, constipation, diarrhea, heartburn, melena, nausea and vomiting.  Genitourinary: Negative.   Musculoskeletal:  Negative for joint pain and myalgias.  Skin:  Negative for rash.  Neurological:  Negative for dizziness, tingling,  sensory change, loss of consciousness, weakness and headaches.  Endo/Heme/Allergies:  Negative for polydipsia.  Psychiatric/Behavioral: Negative.  Negative for depression. The patient is not nervous/anxious and does not have insomnia.   All other systems reviewed and are negative.  Physical Exam: There were no vitals taken for this visit. Wt Readings from Last 3 Encounters:  06/22/21 242 lb 6.4 oz (110 kg)  03/17/21 234 lb (106.1 kg)  11/07/20 236 lb (107 kg)    General Appearance: Well nourished well developed, in no apparent distress. Eyes: PERRLA, EOMs, conjunctiva no swelling or erythema ENT/Mouth: R Ear canal normal without obstruction, swelling, erythma, discharge. L obstructed by firm cerumen.  TMs normal bilaterally after irrigation.  Oropharynx moist, clear, without exudate, or postoropharyngeal swelling. Neck: Supple, thyroid normal,no cervical adenopathy  Respiratory: Respiratory effort normal, Breath sounds clear A&P without rhonchi, wheeze, or rale.  No retractions, no accessory usage. Cardio: RRR with no MRGs. Brisk peripheral pulses without edema.  Abdomen: Soft, + BS,  Non tender, no guarding, rebound, hernias, masses. Musculoskeletal: Full ROM, 5/5 strength, Normal gait Skin: Warm, dry without rashes, lesions, ecchymosis.  Neuro: Awake and oriented X 3, Cranial nerves intact. Normal muscle tone, no cerebellar symptoms. Psych: Normal affect, Insight and Judgment appropriate.    Magda Bernheim, NP 12:44 PM [facility]

## 2021-10-08 ENCOUNTER — Ambulatory Visit: Payer: 59 | Admitting: Nurse Practitioner

## 2021-10-08 ENCOUNTER — Other Ambulatory Visit: Payer: Self-pay

## 2021-10-08 ENCOUNTER — Encounter: Payer: Self-pay | Admitting: Nurse Practitioner

## 2021-10-08 VITALS — BP 148/90 | HR 69 | Temp 97.9°F | Resp 16 | Ht 77.0 in | Wt 243.0 lb

## 2021-10-08 DIAGNOSIS — E1022 Type 1 diabetes mellitus with diabetic chronic kidney disease: Secondary | ICD-10-CM

## 2021-10-08 DIAGNOSIS — E1065 Type 1 diabetes mellitus with hyperglycemia: Secondary | ICD-10-CM

## 2021-10-08 DIAGNOSIS — I10 Essential (primary) hypertension: Secondary | ICD-10-CM

## 2021-10-08 DIAGNOSIS — Z794 Long term (current) use of insulin: Secondary | ICD-10-CM

## 2021-10-08 DIAGNOSIS — N182 Chronic kidney disease, stage 2 (mild): Secondary | ICD-10-CM

## 2021-10-08 DIAGNOSIS — T466X5A Adverse effect of antihyperlipidemic and antiarteriosclerotic drugs, initial encounter: Secondary | ICD-10-CM

## 2021-10-08 DIAGNOSIS — M791 Myalgia, unspecified site: Secondary | ICD-10-CM

## 2021-10-08 DIAGNOSIS — Z79899 Other long term (current) drug therapy: Secondary | ICD-10-CM

## 2021-10-08 DIAGNOSIS — E291 Testicular hypofunction: Secondary | ICD-10-CM

## 2021-10-08 DIAGNOSIS — E1069 Type 1 diabetes mellitus with other specified complication: Secondary | ICD-10-CM | POA: Diagnosis not present

## 2021-10-08 DIAGNOSIS — E139 Other specified diabetes mellitus without complications: Secondary | ICD-10-CM

## 2021-10-08 DIAGNOSIS — M1A9XX0 Chronic gout, unspecified, without tophus (tophi): Secondary | ICD-10-CM

## 2021-10-08 DIAGNOSIS — E782 Mixed hyperlipidemia: Secondary | ICD-10-CM

## 2021-10-08 DIAGNOSIS — E039 Hypothyroidism, unspecified: Secondary | ICD-10-CM

## 2021-10-08 DIAGNOSIS — E1122 Type 2 diabetes mellitus with diabetic chronic kidney disease: Secondary | ICD-10-CM

## 2021-10-08 DIAGNOSIS — E559 Vitamin D deficiency, unspecified: Secondary | ICD-10-CM

## 2021-10-08 DIAGNOSIS — I700 Atherosclerosis of aorta: Secondary | ICD-10-CM

## 2021-10-08 MED ORDER — BASAGLAR TEMPO PEN 100 UNIT/ML ~~LOC~~ SOPN: 50.0000 [IU] | PEN_INJECTOR | Freq: Every day | SUBCUTANEOUS | 1 refills | Status: DC

## 2021-10-08 MED ORDER — OLMESARTAN MEDOXOMIL 20 MG PO TABS: 20.0000 mg | ORAL_TABLET | Freq: Every day | ORAL | 3 refills | Status: DC

## 2021-10-08 NOTE — Progress Notes (Signed)
Patient ID: Daniel Case, male   DOB: February 08, 1961, 61 y.o.   MRN: [id number]  Assessment and Plan:   Aortic Atherosclerosis Control blood sugar, blood pressure, cholesterol and weight  Hypertension:  -Has been out of Olmesartan for several days, Refill sent to [facility] for Olmesartan -monitor blood pressure at home.  -  goal is <130/80 -Continue DASH diet.   -Reminder to go to the ER if any CP, SOB, nausea, dizziness, severe HA, changes vision/speech, left arm numbness and tingling, and jaw pain.  Hyperlipidemia associated with diabetes / Statin myopathy - LDL goal <70; at goal; continue Zetia -Continue diet and exercise.  -Check cholesterol.   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 Recommended low carb, small, frequent meals -  continuous glucose monitor- keep blood sugar log and follow up in 1 month. If Blood sugar remains uncontrolled with the addition of long lasting insulin will refer to endocrinology for evaluation - taking insulin 15-20 units AM and PM, reports had more hypoglycemia and and high AM glucose with 20/10 - restart continuous glucose monitoring with frequent checks for tighter control  - He is having 2 hours after bedtime, blood sugars spiking 150-375- Rx for Basaglar 50 units at bedtime sent to pharmacy, if unable to get filled notify the office immediately - Stressed the importance of eye exam which is overdue- will call to schedule -Check A1C  CKD II due to diabetes -Increase fluids, avoid NSAIDS, monitor sugars, will monitor  Vitamin D Def: - back on supplement - goal 60-100 -defer level today   Hypothyroidism -check TSH level, continue medications the same, reminded to take on an empty stomach (needs to be 30 min prior to coffee with water only)   Gout - Continue allopurinol - Diet discussed; Check uric acid as needed  Testosterone  deficiency Continued perceived benefits with supplementation, no SE. Resent to restart; next OV hold shot prior to appointment; ideally want to check at least 7 days from last; no symptoms of elevated estrogen or dihydrotestosterone. - testosterone  Medication Management Continued  Continue diet and meds as discussed. Further disposition pending results of labs.  Future Appointments  Date Time Provider [place]  03/17/2022 10:00 AM [name], NP [facility] None    HPI 61 y.o. male  presents for 3 month follow up with hypertension, hyperlipidemia, T2 diabetes with CKD, hypothyroid, gout, testosterone def and vitamin D.  He has OSA on CPAP endorses 100% with restorative sleep.   He reports has been having trouble with mail order pharmacy - has been out of several meds, resent Olmesartan and Basaglar( previous script for Lantus was never filled so has not been taking   BMI is Body mass index is 28.82 kg/m., he has been working on diet but admits limited exercise. Previously was going to gym and hiking, stopped with pandemic, needs to restart, work gym opening back up in April.  He does watch portions for diet and trying to follow Dr. Burman Nieves dietary guidelines (high veggies with low larb, low animal protein).  He checks weight at home Wt Readings from Last 3 Encounters:  10/08/21 243 lb (110.2 kg)  06/22/21 242 lb 6.4 oz (110 kg)  03/17/21 234 lb (106.1 kg)     His blood pressure has been controlled at home (120-130/low 70s), today their BP is BP: (!) 148/90. He ran out of his blood pressure medicine BP Readings from  Last 3 Encounters:  10/08/21 (!) 148/90  06/22/21 138/80  03/17/21 120/76       He does workout. He denies chest pain, shortness of breath, dizziness.   He is on cholesterol medication (Zetia 10 mg daily- previously on lipitor and Crestor but had myalgias) and denies myalgias. His cholesterol is at goal. The cholesterol last visit was:   Lab Results   Component Value Date   CHOL 222 (H) 06/22/2021   HDL 42 06/22/2021   LDLCALC 154 (H) 06/22/2021   LDLDIRECT 127.3 10/01/2009   TRIG 131 06/22/2021   CHOLHDL 5.3 (H) 06/22/2021     He has been working on diet and exercise for diabetes due to LADA, patient now on metformin 2000 mg daily and humalog 75/25 mix, reports has been taking 15-20 units AM, 15-20 units PM, and denies foot ulcerations, hyperglycemia, increased appetite, nausea, paresthesia of the feet, polydipsia, polyuria, visual disturbances, vomiting and weight loss. He is on ACEi, statin; Eye doctor at [place] - currently overdue - has been having some blurred vision.  Using continuous glucose monitoring device (freestyle libre) was ordered with instructions to keep close food log. He reports since covid 19 infection in Feb, glucose has been persistently running high. He has been having spikes in blood sugar 2-3 hours after bedtime and was ordered Lantus but was never able to get filled  Checks fasting: recently 130- 250 Blood sugars spiking 2-3 hours after bedtime: recently 300-325  Typically reports that bedtime glucose is lower than fasting/AM  Last A1C in the office was:  Lab Results  Component Value Date   HGBA1C 8.8 (H) 06/22/2021    He has CKD II associated with diabetes monitored at this office:  Lab Results  Component Value Date   [facility] 87 11/07/2020   Patient is on Vitamin D supplement, was off of supplement, back on 4000 IU Lab Results  Component Value Date   VD25OH 40 07/07/2020     Patient underwent Thyroidectomy in 2001 for Thyroid Cancer and is on supressive and replacement therapy.  His medication was not changed last visit as he reported was taking with coffee rather than water on empty stomach. He admits still drinking coffee with splash of milk on the way to work.   Thyroxine 150 mcg -every other day dosing Lab Results  Component Value Date   TSH 6.31 (H) 06/22/2021   He has a history of  testosterone deficiency and is on testosterone replacement, was on topical therapy but was recently transitioned back to injection with perceived improved benefit, taking 200 mg every 2 weeks, last injection was 2 weeks ago .He states that the testosterone helps with his energy, libido, muscle mass. Lab Results  Component Value Date   TESTOSTERONE 516 06/22/2021     Current Medications:  Current Outpatient Medications on File Prior to Visit  Medication Sig Dispense Refill   BD PEN NEEDLE NANO U/F 32G X 4 MM MISC USE TWICE A DAY AS DIRECTED  11   Cholecalciferol (VITAMIN D PO) Take 4,000 Int'l Units by mouth daily.     CINNAMON PO Take 1,000 mg by mouth.     Continuous Blood Gluc Sensor (FREESTYLE LIBRE 14 DAY SENSOR) MISC APPLY NEW SENSOR EVERY 14  DAYS TO CHECK BLOOD SUGAR 4 TIMES DAILY TO MANAGE  INSULIN 7 each 3   Continuous Blood Gluc Sensor MISC 1 each by Does not apply route as directed. Check blood sugar 3 times a day Dx:E10.9 1 each 0  ezetimibe (ZETIA) 10 MG tablet Take  1 tablet  Daily  for Cholesterol 90 tablet 3   glucose blood test strip Check blood sugar 3 times a day-DX-E13.9. 100 each 6   insulin glargine (LANTUS) 100 unit/mL SOPN Inject  50 units at Bedtime for Diabetes 15 mL 3   Insulin Lispro Prot & Lispro (HUMALOG MIX 75/25 KWIKPEN) (75-25) 100 UNIT/ML Kwikpen INJECT SUBCUTANEOUSLY 20  UNITS WITH FIRST MEAL OF  THE DAY AND 10 UNITS WITH  LARGEST MEAL OF THE DAY  PRIOR TO BEDTIME 30 mL 3   levothyroxine (SYNTHROID) 150 MCG tablet Take 1 tablet daily on an empty stomach with only water for 30 minutes & no Antacid meds, Calcium or Magnesium for 4 hours & avoid Biotin. 90 tablet 3   metFORMIN (GLUCOPHAGE-XR) 500 MG 24 hr tablet TAKE 1 TABLET WITH  BREAKFAST AND LUNCH, AND 2  TABLETS WITH SUPPER FOR  DIABETES 360 tablet 3   NEEDLE, DISP, 21 G (BD ECLIPSE NEEDLE) 21G X 1" MISC Use to inject 2 mL intramuscularly every 14 days. 100 each 0   olmesartan (BENICAR) 20 MG tablet Take 1  tablet (20 mg total) by mouth daily. 90 tablet 3   pantoprazole (PROTONIX) 40 MG tablet Take 1 tablet (40 mg total) by mouth daily. 90 tablet 3   SYRINGE-NEEDLE, DISP, 3 ML (BD ECLIPSE SYRINGE) 21G X 1" 3 ML MISC Use to inject 2 mL intramuscularly every 14 days 100 each 0   testosterone cypionate (DEPOTESTOSTERONE CYPIONATE) 200 MG/ML injection INJECT 1 ML (200 mg) AS DIRECTED INTRAMUSCULARLY EVERY 14  DAYS 12 mL 0   rosuvastatin (CRESTOR) 20 MG tablet TAKE 1/2 TABLET BY MOUTH  EVERY OTHER DAY FOR CHOLESTEROL (Patient not taking: Reported on 06/22/2021) 90 tablet 3   No current facility-administered medications on file prior to visit.    Medical History:  Past Medical History:  Diagnosis Date   Allergy    Anxiety    Cancer ([place])    thyroid   Complication of anesthesia 2001   panic attack when going under anesthesia   COVID-19 (09/09/2020) 09/11/2020   Diabetes mellitus without complication (HCC)    type 2   Heart murmur    as a teen   Hyperlipidemia    Hypothyroidism    Kidney stones    Other testicular hypofunction    PONV (postoperative nausea and vomiting)    Sleep apnea     Allergies:  Allergies  Allergen Reactions   Shellfish Allergy Anaphylaxis and Rash   Atorvastatin Other (See Comments)    Myalgias     Allergies:  Allergies  Allergen Reactions   Shellfish Allergy Anaphylaxis and Rash   Atorvastatin Other (See Comments)    Myalgias   Surgical History:  He  has a past surgical history that includes Thyroidectomy (2001); Cystoscopy/retrograde/ureteroscopy/stone extraction with basket (Right, 01/19/2016); Cystoscopy with holmium laser lithotripsy (Right, 01/19/2016); and Colonoscopy. Family History:  Hisfamily history includes Diabetes in his father; Diabetes (age of onset: 57) in his sister; Hypertension in his brother and father; Prostate cancer in his maternal grandfather and paternal grandfather; Thyroid cancer in his paternal grandmother. Social History:    reports that he quit smoking about 8 years ago. His smoking use included cigars. He has never used smokeless tobacco. He reports current alcohol use of about 7.0 standard drinks per week. He reports that he does not use drugs.   Review of Systems:  Review of Systems  Constitutional:  Positive for diaphoresis (middle  of the night). Negative for chills, fever, malaise/fatigue and weight loss.  HENT:  Negative for congestion, ear pain, hearing loss, sore throat and tinnitus.   Eyes:  Positive for blurred vision. Negative for double vision.  Respiratory:  Negative for cough, shortness of breath and wheezing.   Cardiovascular:  Negative for chest pain, palpitations, orthopnea, claudication and leg swelling.  Gastrointestinal:  Negative for abdominal pain, blood in stool, constipation, diarrhea, heartburn, melena, nausea and vomiting.  Genitourinary: Negative.   Musculoskeletal:  Negative for joint pain and myalgias.  Skin:  Negative for rash.  Neurological:  Positive for tingling (occ in feet bilaterally). Negative for dizziness, sensory change, loss of consciousness, weakness and headaches.  Endo/Heme/Allergies:  Negative for polydipsia.  Psychiatric/Behavioral: Negative.  Negative for depression. The patient is not nervous/anxious and does not have insomnia.   All other systems reviewed and are negative.  Physical Exam: BP (!) 148/90    Pulse 69    Temp 97.9 F (36.6 C)    Resp 16    Ht 6\' 5"  (1.956 m)    Wt 243 lb (110.2 kg)    SpO2 96%    BMI 28.82 kg/m  Wt Readings from Last 3 Encounters:  10/08/21 243 lb (110.2 kg)  06/22/21 242 lb 6.4 oz (110 kg)  03/17/21 234 lb (106.1 kg)    General Appearance: Well nourished well developed, in no apparent distress. Eyes: PERRLA, EOMs, conjunctiva no swelling or erythema ENT/Mouth: .  TMs normal bilaterally.  Oropharynx moist, clear, without exudate, or postoropharyngeal swelling. Neck: Supple, thyroid normal,no cervical adenopathy  Respiratory:  Respiratory effort normal, Breath sounds clear A&P without rhonchi, wheeze, or rale.  No retractions, no accessory usage. Cardio: RRR with no MRGs. Brisk peripheral pulses without edema.  Abdomen: Soft, + BS,  Non tender, no guarding, rebound, hernias, masses. Musculoskeletal: Full ROM, 5/5 strength, Normal gait Skin: Warm, dry without rashes, lesions, ecchymosis.  Neuro: Awake and oriented X 3, Cranial nerves intact. Normal muscle tone, no cerebellar symptoms.Normal sensation of feet Psych: Normal affect, Insight and Judgment appropriate.    Magda Bernheim, NP 3:59 PM [facility]

## 2021-10-09 LAB — LIPID PANEL
Cholesterol: 177 mg/dL (ref <200)
HDL: 43 mg/dL (ref >=40)
LDL Cholesterol (Calc): 100 mg/dL (calc) — ABNORMAL HIGH
Non-HDL Cholesterol (Calc): 134 mg/dL (calc) — ABNORMAL HIGH (ref <130)
Total CHOL/HDL Ratio: 4.1 (calc) (ref <5.0)
Triglycerides: 227 mg/dL — ABNORMAL HIGH (ref <150)

## 2021-10-09 LAB — CBC WITH DIFFERENTIAL/PLATELET
Absolute Monocytes: 581 cells/uL (ref 200–950)
Basophils Absolute: 73 cells/uL (ref 0–200)
Basophils Relative: 1.1 %
Eosinophils Absolute: 92 cells/uL (ref 15–500)
Eosinophils Relative: 1.4 %
HCT: 43.7 % (ref 38.5–50.0)
Hemoglobin: 14.8 g/dL (ref 13.2–17.1)
Lymphs Abs: 1841 cells/uL (ref 850–3900)
MCH: 32.3 pg (ref 27.0–33.0)
MCHC: 33.9 g/dL (ref 32.0–36.0)
MCV: 95.4 fL (ref 80.0–100.0)
MPV: 9.6 fL (ref 7.5–12.5)
Monocytes Relative: 8.8 %
Neutro Abs: 4013 cells/uL (ref 1500–7800)
Neutrophils Relative %: 60.8 %
Platelets: 274 10*3/uL (ref 140–400)
RBC: 4.58 10*6/uL (ref 4.20–5.80)
RDW: 12.4 % (ref 11.0–15.0)
Total Lymphocyte: 27.9 %
WBC: 6.6 10*3/uL (ref 3.8–10.8)

## 2021-10-09 LAB — COMPLETE METABOLIC PANEL WITH GFR
AG Ratio: 1.7 (calc) (ref 1.0–2.5)
ALT: 15 U/L (ref 9–46)
AST: 17 U/L (ref 10–35)
Albumin: 4.4 g/dL (ref 3.6–5.1)
Alkaline phosphatase (APISO): 54 U/L (ref 35–144)
BUN: 21 mg/dL (ref 7–25)
CO2: 29 mmol/L (ref 20–32)
Calcium: 9.8 mg/dL (ref 8.6–10.3)
Chloride: 103 mmol/L (ref 98–110)
Creat: 1.3 mg/dL (ref 0.70–1.35)
Globulin: 2.6 g/dL (calc) (ref 1.9–3.7)
Glucose, Bld: 113 mg/dL — ABNORMAL HIGH (ref 65–99)
Potassium: 4.4 mmol/L (ref 3.5–5.3)
Sodium: 139 mmol/L (ref 135–146)
Total Bilirubin: 0.5 mg/dL (ref 0.2–1.2)
Total Protein: 7 g/dL (ref 6.1–8.1)
eGFR: 63 mL/min/{1.73_m2} (ref >=60)

## 2021-10-09 LAB — HEMOGLOBIN A1C
Hgb A1c MFr Bld: 8.4 % of total Hgb — ABNORMAL HIGH (ref <5.7)
Mean Plasma Glucose: 194 mg/dL
eAG (mmol/L): 10.8 mmol/L

## 2021-10-09 LAB — TSH: TSH: 2.57 mIU/L (ref 0.40–4.50)

## 2021-10-09 LAB — TESTOSTERONE: Testosterone: 375 ng/dL (ref 250–827)

## 2021-10-12 ENCOUNTER — Telehealth: Payer: Self-pay

## 2021-10-12 NOTE — Telephone Encounter (Signed)
Prior Authorization for Daniel Case was denied.

## 2021-10-12 NOTE — Telephone Encounter (Signed)
Patient states that a new prescription for his insulin should be 30 units in the am and 30 units in the pm.   Nancee Liter is not covered but others are. Please send in new prescription.  He thinks that Lantus is covered.

## 2021-10-13 ENCOUNTER — Other Ambulatory Visit: Payer: Self-pay | Admitting: Nurse Practitioner

## 2021-10-13 DIAGNOSIS — E139 Other specified diabetes mellitus without complications: Secondary | ICD-10-CM

## 2021-10-13 DIAGNOSIS — E1122 Type 2 diabetes mellitus with diabetic chronic kidney disease: Secondary | ICD-10-CM

## 2021-10-13 DIAGNOSIS — E1065 Type 1 diabetes mellitus with hyperglycemia: Secondary | ICD-10-CM

## 2021-10-13 DIAGNOSIS — E1069 Type 1 diabetes mellitus with other specified complication: Secondary | ICD-10-CM

## 2021-10-13 MED ORDER — INSULIN GLARGINE 100 UNIT/ML ~~LOC~~ SOLN: 50.0000 [IU] | Freq: Every day | SUBCUTANEOUS | 11 refills | Status: DC

## 2021-10-13 MED ORDER — INSULIN LISPRO PROT & LISPRO (75-25 MIX) 100 UNIT/ML KWIKPEN: PEN_INJECTOR | SUBCUTANEOUS | 3 refills | Status: DC

## 2021-10-13 NOTE — Telephone Encounter (Signed)
Can you please let him know I have sent in the insulin but I think he needs to see an endocrinologist and I have placed a referral

## 2021-11-04 NOTE — Progress Notes (Signed)
.  Patient ID: Daniel Case, male   DOB: 05/06/1961, 61 y.o.   MRN: [id number]  Assessment and Plan:   Hypertension:  - continue medications, DASH diet, exercise and monitor at home. Call if greater than 130/80.   -Reminder to go to the ER if any CP, SOB, nausea, dizziness, severe HA, changes vision/speech, left arm numbness and tingling, and jaw pain.   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 Recommended low carb, small, frequent meals -  continuous glucose monitor- keep blood sugar log and follow up in 2 months and will recheck A1c at that visit. Blood sugars have improved with addition of Lantus - taking insulin 15-20 units AM and PM,  - 20 units Lantus at bedtime - Stressed the importance of eye exam which is overdue- will call to schedule -  CKD II due to diabetes -Increase fluids, avoid NSAIDS, monitor sugars, will monitor     Continue diet and meds as discussed. Further disposition pending results of labs.  Future Appointments  Date Time Provider [place]  11/09/2021  4:00 PM Magda Bernheim, NP [facility] None  03/17/2022 10:00 AM [name], NP [facility] None    HPI 61 y.o. male  presents for 40monthfollow up with hypertension, T2 diabetes with CKD     BMI is Body mass index is 29.36 kg/m., he has been working on diet but admits limited exercise.  He does watch portions for diet and trying to follow Dr. FBurman Nievesdietary guidelines (high veggies with low larb, low animal protein).  He checks weight at home. He does workout on treadmill 30 minutes several times a week Wt Readings from Last 3 Encounters:  11/09/21 247 lb 9.6 oz (112.3 kg)  10/08/21 243 lb (110.2 kg)  06/22/21 242 lb 6.4 oz (110 kg)     His blood pressure has been controlled at home (120-130/low 70s), today their BP is BP: 138/82.  BP Readings from Last 3 Encounters:  11/09/21  138/82  10/08/21 (!) 148/90  06/22/21 138/80       He does workout. He denies chest pain, shortness of breath, dizziness.   He has been working on diet and exercise for diabetes due to LADA, patient now on metformin 2000 mg daily and humalog 75/25 mix, reports has been taking 15-20 units AM, 15-20 units PM. He is also taking 20 units of Lantus at bedtime and denies foot ulcerations, hyperglycemia, increased appetite, nausea, paresthesia of the feet, polydipsia, polyuria, visual disturbances, vomiting and weight loss. He is on ACEi, statin; [place] - scheduled for 12/2021.   Checks fasting: recently 120's Nol longer getting spikes, staying less than 180 usually in 160's Typically reports that bedtime glucose is lower than fasting/AM  Last A1C in the office was:  Lab Results  Component Value Date   HGBA1C 8.4 (H) 10/08/2021    He has CKD II associated with diabetes monitored at this office:  Lab Results  Component Value Date   GFRNONAA 87 11/07/2020   Patient is on Vitamin D supplement, was off of supplement, back on 4000 IU Lab Results  Component Value Date   VD25OH 40 07/07/2020       Current Medications:  Current Outpatient Medications on File Prior to Visit  Medication Sig Dispense Refill   Cholecalciferol (VITAMIN D PO) Take 4,000 Int'l Units by mouth daily.  CINNAMON PO Take 1,000 mg by mouth.     ezetimibe (ZETIA) 10 MG tablet Take  1 tablet  Daily  for Cholesterol 90 tablet 3   insulin glargine (LANTUS) 100 UNIT/ML injection Inject 0.5 mLs (50 Units total) into the skin at bedtime. 20 mL 11   Insulin Lispro Prot & Lispro (HUMALOG MIX 75/25 KWIKPEN) (75-25) 100 UNIT/ML Kwikpen INJECT SUBCUTANEOUSLY 30  UNITS WITH FIRST MEAL OF  THE DAY AND 30 UNITS WITH  LARGEST MEAL OF THE DAY  PRIOR TO BEDTIME 30 mL 3   levothyroxine (SYNTHROID) 150 MCG tablet Take 1 tablet daily on an empty stomach with only water for 30 minutes & no Antacid meds,  Calcium or Magnesium for 4 hours & avoid Biotin. 90 tablet 3   metFORMIN (GLUCOPHAGE-XR) 500 MG 24 hr tablet TAKE 1 TABLET WITH  BREAKFAST AND LUNCH, AND 2  TABLETS WITH SUPPER FOR  DIABETES 360 tablet 3   olmesartan (BENICAR) 20 MG tablet Take 1 tablet (20 mg total) by mouth daily. 90 tablet 3   pantoprazole (PROTONIX) 40 MG tablet Take 1 tablet (40 mg total) by mouth daily. 90 tablet 3   testosterone cypionate (DEPOTESTOSTERONE CYPIONATE) 200 MG/ML injection INJECT 1 ML (200 mg) AS DIRECTED INTRAMUSCULARLY EVERY 14  DAYS 12 mL 0   BD PEN NEEDLE NANO U/F 32G X 4 MM MISC USE TWICE A DAY AS DIRECTED  11   Continuous Blood Gluc Sensor (FREESTYLE LIBRE 14 DAY SENSOR) MISC APPLY NEW SENSOR EVERY 14  DAYS TO CHECK BLOOD SUGAR 4 TIMES DAILY TO MANAGE  INSULIN 7 each 3   Continuous Blood Gluc Sensor MISC 1 each by Does not apply route as directed. Check blood sugar 3 times a day Dx:E10.9 1 each 0   glucose blood test strip Check blood sugar 3 times a day-DX-E13.9. 100 each 6   NEEDLE, DISP, 21 G (BD ECLIPSE NEEDLE) 21G X 1" MISC Use to inject 2 mL intramuscularly every 14 days. 100 each 0   rosuvastatin (CRESTOR) 20 MG tablet TAKE 1/2 TABLET BY MOUTH  EVERY OTHER DAY FOR CHOLESTEROL (Patient not taking: Reported on 06/22/2021) 90 tablet 3   SYRINGE-NEEDLE, DISP, 3 ML (BD ECLIPSE SYRINGE) 21G X 1" 3 ML MISC Use to inject 2 mL intramuscularly every 14 days 100 each 0   No current facility-administered medications on file prior to visit.    Medical History:  Past Medical History:  Diagnosis Date   Allergy    Anxiety    Cancer ([place])    thyroid   Complication of anesthesia 2001   panic attack when going under anesthesia   COVID-19 (09/09/2020) 09/11/2020   Diabetes mellitus without complication (HCC)    type 2   Heart murmur    as a teen   Hyperlipidemia    Hypothyroidism    Kidney stones    Other testicular hypofunction    PONV (postoperative nausea and vomiting)    Sleep apnea      Allergies:  Allergies  Allergen Reactions   Shellfish Allergy Anaphylaxis and Rash   Atorvastatin Other (See Comments)    Myalgias     Allergies:  Allergies  Allergen Reactions   Shellfish Allergy Anaphylaxis and Rash   Atorvastatin Other (See Comments)    Myalgias   Surgical History:  He  has a past surgical history that includes Thyroidectomy (2001); Cystoscopy/retrograde/ureteroscopy/stone extraction with basket (Right, 01/19/2016); Cystoscopy with holmium laser lithotripsy (Right, 01/19/2016); and Colonoscopy. Family History:  Hisfamily history  includes Diabetes in his father; Diabetes (age of onset: 68) in his sister; Hypertension in his brother and father; Prostate cancer in his maternal grandfather and paternal grandfather; Thyroid cancer in his paternal grandmother. Social History:   reports that he quit smoking about 8 years ago. His smoking use included cigars. He has never used smokeless tobacco. He reports current alcohol use of about 7.0 standard drinks per week. He reports that he does not use drugs.   Review of Systems:  Review of Systems  Constitutional:  Positive for diaphoresis (middle of the night). Negative for chills, fever, malaise/fatigue and weight loss.  HENT:  Negative for congestion, ear pain, hearing loss, sore throat and tinnitus.   Eyes:  Positive for blurred vision. Negative for double vision.  Respiratory:  Negative for cough, shortness of breath and wheezing.   Cardiovascular:  Negative for chest pain, palpitations, orthopnea, claudication and leg swelling.  Gastrointestinal:  Negative for abdominal pain, blood in stool, constipation, diarrhea, heartburn, melena, nausea and vomiting.  Genitourinary: Negative.   Musculoskeletal:  Negative for joint pain and myalgias.  Skin:  Negative for rash.  Neurological:  Positive for tingling (occ in feet bilaterally). Negative for dizziness, sensory change, loss of consciousness, weakness and headaches.   Endo/Heme/Allergies:  Negative for polydipsia.  Psychiatric/Behavioral: Negative.  Negative for depression. The patient is not nervous/anxious and does not have insomnia.   All other systems reviewed and are negative.  Physical Exam: BP 138/82    Pulse (!) 50    Temp 97.9 F (36.6 C)    Wt 247 lb 9.6 oz (112.3 kg)    SpO2 98%    BMI 29.36 kg/m  Wt Readings from Last 3 Encounters:  11/09/21 247 lb 9.6 oz (112.3 kg)  10/08/21 243 lb (110.2 kg)  06/22/21 242 lb 6.4 oz (110 kg)    General Appearance: Well nourished well developed, in no apparent distress. Eyes: PERRLA, EOMs, conjunctiva no swelling or erythema ENT/Mouth: .  TMs normal bilaterally.  Oropharynx moist, clear, without exudate, or postoropharyngeal swelling. Neck: Supple, thyroid normal,no cervical adenopathy  Respiratory: Respiratory effort normal, Breath sounds clear A&P without rhonchi, wheeze, or rale.  No retractions, no accessory usage. Cardio: RRR with no MRGs. Brisk peripheral pulses without edema.  Abdomen: Soft, + BS,  Non tender, no guarding, rebound, hernias, masses. Musculoskeletal: Full ROM, 5/5 strength, Normal gait Skin: Warm, dry without rashes, lesions, ecchymosis.  Neuro: Awake and oriented X 3, Cranial nerves intact. Normal muscle tone, no cerebellar symptoms.Normal sensation of feet Psych: Normal affect, Insight and Judgment appropriate.    Magda Bernheim, NP 3:54 PM [facility]

## 2021-11-09 ENCOUNTER — Other Ambulatory Visit: Payer: Self-pay

## 2021-11-09 ENCOUNTER — Ambulatory Visit: Payer: 59 | Admitting: Nurse Practitioner

## 2021-11-09 ENCOUNTER — Encounter: Payer: Self-pay | Admitting: Nurse Practitioner

## 2021-11-09 VITALS — BP 138/82 | HR 50 | Temp 97.9°F | Wt 247.6 lb

## 2021-11-09 DIAGNOSIS — E1022 Type 1 diabetes mellitus with diabetic chronic kidney disease: Secondary | ICD-10-CM | POA: Diagnosis not present

## 2021-11-09 DIAGNOSIS — E1122 Type 2 diabetes mellitus with diabetic chronic kidney disease: Secondary | ICD-10-CM

## 2021-11-09 DIAGNOSIS — I10 Essential (primary) hypertension: Secondary | ICD-10-CM

## 2021-11-09 DIAGNOSIS — Z794 Long term (current) use of insulin: Secondary | ICD-10-CM

## 2021-11-09 DIAGNOSIS — E1069 Type 1 diabetes mellitus with other specified complication: Secondary | ICD-10-CM | POA: Diagnosis not present

## 2021-11-09 DIAGNOSIS — E782 Mixed hyperlipidemia: Secondary | ICD-10-CM

## 2021-11-09 DIAGNOSIS — N182 Chronic kidney disease, stage 2 (mild): Secondary | ICD-10-CM

## 2021-11-09 MED ORDER — INSULIN GLARGINE 100 UNIT/ML ~~LOC~~ SOLN: 20.0000 [IU] | Freq: Every day | SUBCUTANEOUS | 11 refills | Status: DC

## 2022-01-11 ENCOUNTER — Other Ambulatory Visit: Payer: Self-pay | Admitting: Adult Health

## 2022-01-20 NOTE — Progress Notes (Unsigned)
Patient ID: Daniel Case, male   DOB: 1961-07-08, 61 y.o.   MRN: [id number]  Assessment and Plan:   Aortic Atherosclerosis Control blood sugar, blood pressure, cholesterol and weight  Hypertension:  -Has been out of Olmesartan for several days, Refill sent to [facility] for Olmesartan -monitor blood pressure at home.  -  goal is <130/80 -Continue DASH diet.   -Reminder to go to the ER if any CP, SOB, nausea, dizziness, severe HA, changes vision/speech, left arm numbness and tingling, and jaw pain. - CBC, CMP  Hyperlipidemia associated with diabetes / Statin myopathy - LDL goal <70; at goal; continue Zetia -Continue diet and exercise.  -Check cholesterol.   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 Recommended low carb, small, frequent meals -  continuous glucose monitor- keep blood sugar log  Blood sugars have improved with addition of Lantus - taking insulin 15-20 units AM and PM,  - 20 units Lantus at bedtime - Stressed the importance of eye exam which is overdue- will call to schedule - A1c  CKD II due to diabetes -Increase fluids, avoid NSAIDS, monitor sugars, will monitor  Vitamin D Def: - back on supplement - goal 60-100 -defer level today   Hypothyroidism -check TSH level, continue medications the same, reminded to take on an empty stomach (needs to be 30 min prior to coffee with water only)   Gout - Continue allopurinol - Diet discussed; Check uric acid as needed  Testosterone deficiency Continued perceived benefits with supplementation, no SE. Resent to restart; next OV hold shot prior to appointment; ideally want to check at least 7 days from last; no symptoms of elevated estrogen or dihydrotestosterone.   Medication Management Continued  Continue diet and meds as discussed. Further disposition pending results of labs.  Future Appointments  Date Time  Provider [place]  01/21/2022  4:00 PM Magda Bernheim, NP [facility] None  01/28/2022  8:20 AM Philemon Kingdom, MD [facility] None  03/17/2022 10:00 AM [name], NP [facility] None    HPI 61 y.o. male  presents for 3 month follow up with hypertension, hyperlipidemia, T2 diabetes with CKD, hypothyroid, gout, testosterone def and vitamin D.  He has OSA on CPAP endorses 100% with restorative sleep.     BMI is There is no height or weight on file to calculate BMI., he has been working on diet but admits limited exercise. Previously was going to gym and hiking, stopped with pandemic, needs to restart, work gym opening back up in April.  He does watch portions for diet and trying to follow Dr. Burman Nieves dietary guidelines (high veggies with low larb, low animal protein).  He checks weight at home Wt Readings from Last 3 Encounters:  11/09/21 247 lb 9.6 oz (112.3 kg)  10/08/21 243 lb (110.2 kg)  06/22/21 242 lb 6.4 oz (110 kg)     His blood pressure has been controlled at home (120-130/low 70s), today their BP is  . He ran out of his blood pressure medicine BP Readings from Last 3 Encounters:  11/09/21 138/82  10/08/21 (!) 148/90  06/22/21 138/80       He does workout. He denies chest pain, shortness of breath, dizziness.   He is on cholesterol medication (Zetia 10 mg daily- previously on lipitor and Crestor but had myalgias) and denies myalgias. His cholesterol is at goal. The cholesterol last visit was:   Lab  Results  Component Value Date   CHOL 177 10/08/2021   HDL 43 10/08/2021   LDLCALC 100 (H) 10/08/2021   LDLDIRECT 127.3 10/01/2009   TRIG 227 (H) 10/08/2021   CHOLHDL 4.1 10/08/2021     He has been working on diet and exercise for diabetes due to LADA, patient now on metformin 2000 mg daily and humalog 75/25 mix, reports has been taking 15-20 units AM, 15-20 units PM. He is also taking 20 units of Lantus at bedtime and denies foot ulcerations, hyperglycemia,  increased appetite, nausea, paresthesia of the feet, polydipsia, polyuria, visual disturbances, vomiting and weight loss. He is on ACEi, statin; Eye doctor at [place] - currently overdue - scheduled for 12/2021.     Checks fasting: recently 120's Nol longer getting spikes, staying less than 180 usually in 160's Typically reports that bedtime glucose is lower than fasting/AM Last A1C in the office was:  Lab Results  Component Value Date   HGBA1C 8.4 (H) 10/08/2021     He has CKD II associated with diabetes monitored at this office:  Lab Results  Component Value Date   [facility] 87 11/07/2020   Patient is on Vitamin D supplement, was off of supplement, back on 4000 IU Lab Results  Component Value Date   VD25OH 40 07/07/2020     Patient underwent Thyroidectomy in 2001 for Thyroid Cancer and is on supressive and replacement therapy.  His medication was not changed last visit as he reported was taking with coffee rather than water on empty stomach. He admits still drinking coffee with splash of milk on the way to work.   Thyroxine 150 mcg -every other day dosing Lab Results  Component Value Date   TSH 2.57 10/08/2021   He has a history of testosterone deficiency and is on testosterone replacement, was on topical therapy but was recently transitioned back to injection with perceived improved benefit, taking 200 mg every 2 weeks, last injection was 2 weeks ago .He states that the testosterone helps with his energy, libido, muscle mass. Lab Results  Component Value Date   TESTOSTERONE 375 10/08/2021     Current Medications:  Current Outpatient Medications on File Prior to Visit  Medication Sig Dispense Refill   allopurinol (ZYLOPRIM) 100 MG tablet TAKE 1 TABLET BY MOUTH  DAILY TO PREVENT GOUT 90 tablet 3   BD PEN NEEDLE NANO U/F 32G X 4 MM MISC USE TWICE A DAY AS DIRECTED  11   Cholecalciferol (VITAMIN D PO) Take 4,000 Int'l Units by mouth daily.     CINNAMON PO Take 1,000 mg by  mouth.     Continuous Blood Gluc Sensor (FREESTYLE LIBRE 14 DAY SENSOR) MISC APPLY NEW SENSOR EVERY 14  DAYS TO CHECK BLOOD SUGAR 4 TIMES DAILY TO MANAGE  INSULIN 7 each 3   Continuous Blood Gluc Sensor MISC 1 each by Does not apply route as directed. Check blood sugar 3 times a day Dx:E10.9 1 each 0   ezetimibe (ZETIA) 10 MG tablet Take  1 tablet  Daily  for Cholesterol 90 tablet 3   glucose blood test strip Check blood sugar 3 times a day-DX-E13.9. 100 each 6   insulin glargine (LANTUS) 100 UNIT/ML injection Inject 0.2 mLs (20 Units total) into the skin at bedtime. 20 mL 11   Insulin Lispro Prot & Lispro (HUMALOG MIX 75/25 KWIKPEN) (75-25) 100 UNIT/ML Kwikpen INJECT SUBCUTANEOUSLY 30  UNITS WITH FIRST MEAL OF  THE DAY AND 30 UNITS WITH  LARGEST MEAL  OF THE DAY  PRIOR TO BEDTIME 30 mL 3   levothyroxine (SYNTHROID) 150 MCG tablet Take 1 tablet daily on an empty stomach with only water for 30 minutes & no Antacid meds, Calcium or Magnesium for 4 hours & avoid Biotin. 90 tablet 3   metFORMIN (GLUCOPHAGE-XR) 500 MG 24 hr tablet TAKE 1 TABLET WITH  BREAKFAST AND LUNCH, AND 2  TABLETS WITH SUPPER FOR  DIABETES 360 tablet 3   NEEDLE, DISP, 21 G (BD ECLIPSE NEEDLE) 21G X 1" MISC Use to inject 2 mL intramuscularly every 14 days. 100 each 0   olmesartan (BENICAR) 20 MG tablet Take 1 tablet (20 mg total) by mouth daily. 90 tablet 3   pantoprazole (PROTONIX) 40 MG tablet Take 1 tablet (40 mg total) by mouth daily. 90 tablet 3   SYRINGE-NEEDLE, DISP, 3 ML (BD ECLIPSE SYRINGE) 21G X 1" 3 ML MISC Use to inject 2 mL intramuscularly every 14 days 100 each 0   testosterone cypionate (DEPOTESTOSTERONE CYPIONATE) 200 MG/ML injection INJECT 1 ML (200 mg) AS DIRECTED INTRAMUSCULARLY EVERY 14  DAYS 12 mL 0   No current facility-administered medications on file prior to visit.    Medical History:  Past Medical History:  Diagnosis Date   Allergy    Anxiety    Cancer ([place])    thyroid   Complication of anesthesia  2001   panic attack when going under anesthesia   COVID-19 (09/09/2020) 09/11/2020   Diabetes mellitus without complication (HCC)    type 2   Heart murmur    as a teen   Hyperlipidemia    Hypothyroidism    Kidney stones    Other testicular hypofunction    PONV (postoperative nausea and vomiting)    Sleep apnea     Allergies:  Allergies  Allergen Reactions   Shellfish Allergy Anaphylaxis and Rash   Atorvastatin Other (See Comments)    Myalgias     Allergies:  Allergies  Allergen Reactions   Shellfish Allergy Anaphylaxis and Rash   Atorvastatin Other (See Comments)    Myalgias   Surgical History:  He  has a past surgical history that includes Thyroidectomy (2001); Cystoscopy/retrograde/ureteroscopy/stone extraction with basket (Right, 01/19/2016); Cystoscopy with holmium laser lithotripsy (Right, 01/19/2016); and Colonoscopy. Family History:  Hisfamily history includes Diabetes in his father; Diabetes (age of onset: 60) in his sister; Hypertension in his brother and father; Prostate cancer in his maternal grandfather and paternal grandfather; Thyroid cancer in his paternal grandmother. Social History:   reports that he quit smoking about 8 years ago. His smoking use included cigars. He has never used smokeless tobacco. He reports current alcohol use of about 7.0 standard drinks per week. He reports that he does not use drugs.   Review of Systems:  Review of Systems  Constitutional:  Positive for diaphoresis (middle of the night). Negative for chills, fever, malaise/fatigue and weight loss.  HENT:  Negative for congestion, ear pain, hearing loss, sore throat and tinnitus.   Eyes:  Positive for blurred vision. Negative for double vision.  Respiratory:  Negative for cough, shortness of breath and wheezing.   Cardiovascular:  Negative for chest pain, palpitations, orthopnea, claudication and leg swelling.  Gastrointestinal:  Negative for abdominal pain, blood in stool, constipation,  diarrhea, heartburn, melena, nausea and vomiting.  Genitourinary: Negative.   Musculoskeletal:  Negative for joint pain and myalgias.  Skin:  Negative for rash.  Neurological:  Positive for tingling (occ in feet bilaterally). Negative for dizziness, sensory change,  loss of consciousness, weakness and headaches.  Endo/Heme/Allergies:  Negative for polydipsia.  Psychiatric/Behavioral: Negative.  Negative for depression. The patient is not nervous/anxious and does not have insomnia.   All other systems reviewed and are negative.  Physical Exam: There were no vitals taken for this visit. Wt Readings from Last 3 Encounters:  11/09/21 247 lb 9.6 oz (112.3 kg)  10/08/21 243 lb (110.2 kg)  06/22/21 242 lb 6.4 oz (110 kg)    General Appearance: Well nourished well developed, in no apparent distress. Eyes: PERRLA, EOMs, conjunctiva no swelling or erythema ENT/Mouth: .  TMs normal bilaterally.  Oropharynx moist, clear, without exudate, or postoropharyngeal swelling. Neck: Supple, thyroid normal,no cervical adenopathy  Respiratory: Respiratory effort normal, Breath sounds clear A&P without rhonchi, wheeze, or rale.  No retractions, no accessory usage. Cardio: RRR with no MRGs. Brisk peripheral pulses without edema.  Abdomen: Soft, + BS,  Non tender, no guarding, rebound, hernias, masses. Musculoskeletal: Full ROM, 5/5 strength, Normal gait Skin: Warm, dry without rashes, lesions, ecchymosis.  Neuro: Awake and oriented X 3, Cranial nerves intact. Normal muscle tone, no cerebellar symptoms.Normal sensation of feet Psych: Normal affect, Insight and Judgment appropriate.    Magda Bernheim, NP 12:18 PM [facility]

## 2022-01-21 ENCOUNTER — Ambulatory Visit: Payer: 59 | Admitting: Nurse Practitioner

## 2022-01-21 ENCOUNTER — Encounter: Payer: Self-pay | Admitting: Nurse Practitioner

## 2022-01-21 VITALS — BP 122/72 | HR 70 | Temp 97.7°F | Wt 245.0 lb

## 2022-01-21 DIAGNOSIS — E663 Overweight: Secondary | ICD-10-CM

## 2022-01-21 DIAGNOSIS — E782 Mixed hyperlipidemia: Secondary | ICD-10-CM

## 2022-01-21 DIAGNOSIS — N182 Chronic kidney disease, stage 2 (mild): Secondary | ICD-10-CM

## 2022-01-21 DIAGNOSIS — M1000 Idiopathic gout, unspecified site: Secondary | ICD-10-CM

## 2022-01-21 DIAGNOSIS — E1022 Type 1 diabetes mellitus with diabetic chronic kidney disease: Secondary | ICD-10-CM

## 2022-01-21 DIAGNOSIS — E1069 Type 1 diabetes mellitus with other specified complication: Secondary | ICD-10-CM | POA: Diagnosis not present

## 2022-01-21 DIAGNOSIS — E139 Other specified diabetes mellitus without complications: Secondary | ICD-10-CM | POA: Diagnosis not present

## 2022-01-21 DIAGNOSIS — E291 Testicular hypofunction: Secondary | ICD-10-CM

## 2022-01-21 DIAGNOSIS — E039 Hypothyroidism, unspecified: Secondary | ICD-10-CM

## 2022-01-21 DIAGNOSIS — T466X5A Adverse effect of antihyperlipidemic and antiarteriosclerotic drugs, initial encounter: Secondary | ICD-10-CM

## 2022-01-21 DIAGNOSIS — E1122 Type 2 diabetes mellitus with diabetic chronic kidney disease: Secondary | ICD-10-CM

## 2022-01-21 DIAGNOSIS — M791 Myalgia, unspecified site: Secondary | ICD-10-CM

## 2022-01-21 DIAGNOSIS — Z79899 Other long term (current) drug therapy: Secondary | ICD-10-CM

## 2022-01-21 DIAGNOSIS — E559 Vitamin D deficiency, unspecified: Secondary | ICD-10-CM

## 2022-01-21 DIAGNOSIS — I10 Essential (primary) hypertension: Secondary | ICD-10-CM

## 2022-01-21 DIAGNOSIS — I700 Atherosclerosis of aorta: Secondary | ICD-10-CM

## 2022-01-22 LAB — CBC WITH DIFFERENTIAL/PLATELET
Absolute Monocytes: 750 cells/uL (ref 200–950)
Basophils Absolute: 63 cells/uL (ref 0–200)
Basophils Relative: 1 %
Eosinophils Absolute: 69 cells/uL (ref 15–500)
Eosinophils Relative: 1.1 %
HCT: 42.2 % (ref 38.5–50.0)
Hemoglobin: 14.4 g/dL (ref 13.2–17.1)
Lymphs Abs: 1695 cells/uL (ref 850–3900)
MCH: 32.1 pg (ref 27.0–33.0)
MCHC: 34.1 g/dL (ref 32.0–36.0)
MCV: 94 fL (ref 80.0–100.0)
MPV: 9.6 fL (ref 7.5–12.5)
Monocytes Relative: 11.9 %
Neutro Abs: 3723 cells/uL (ref 1500–7800)
Neutrophils Relative %: 59.1 %
Platelets: 272 10*3/uL (ref 140–400)
RBC: 4.49 10*6/uL (ref 4.20–5.80)
RDW: 12.7 % (ref 11.0–15.0)
Total Lymphocyte: 26.9 %
WBC: 6.3 10*3/uL (ref 3.8–10.8)

## 2022-01-22 LAB — HEMOGLOBIN A1C
Hgb A1c MFr Bld: 8 % of total Hgb — ABNORMAL HIGH (ref <5.7)
Mean Plasma Glucose: 183 mg/dL
eAG (mmol/L): 10.1 mmol/L

## 2022-01-22 LAB — COMPLETE METABOLIC PANEL WITH GFR
AG Ratio: 1.8 (calc) (ref 1.0–2.5)
ALT: 15 U/L (ref 9–46)
AST: 16 U/L (ref 10–35)
Albumin: 4.4 g/dL (ref 3.6–5.1)
Alkaline phosphatase (APISO): 63 U/L (ref 35–144)
BUN: 21 mg/dL (ref 7–25)
CO2: 26 mmol/L (ref 20–32)
Calcium: 9.7 mg/dL (ref 8.6–10.3)
Chloride: 104 mmol/L (ref 98–110)
Creat: 1.22 mg/dL (ref 0.70–1.35)
Globulin: 2.5 g/dL (calc) (ref 1.9–3.7)
Glucose, Bld: 121 mg/dL — ABNORMAL HIGH (ref 65–99)
Potassium: 4.7 mmol/L (ref 3.5–5.3)
Sodium: 139 mmol/L (ref 135–146)
Total Bilirubin: 0.6 mg/dL (ref 0.2–1.2)
Total Protein: 6.9 g/dL (ref 6.1–8.1)
eGFR: 67 mL/min/{1.73_m2} (ref >=60)

## 2022-01-22 LAB — LIPID PANEL
Cholesterol: 174 mg/dL (ref <200)
HDL: 47 mg/dL (ref >=40)
LDL Cholesterol (Calc): 106 mg/dL (calc) — ABNORMAL HIGH
Non-HDL Cholesterol (Calc): 127 mg/dL (calc) (ref <130)
Total CHOL/HDL Ratio: 3.7 (calc) (ref <5.0)
Triglycerides: 118 mg/dL (ref <150)

## 2022-01-22 LAB — TSH: TSH: 0.96 mIU/L (ref 0.40–4.50)

## 2022-01-28 ENCOUNTER — Ambulatory Visit: Payer: 59 | Admitting: Internal Medicine

## 2022-02-19 ENCOUNTER — Telehealth: Payer: Self-pay | Admitting: Nurse Practitioner

## 2022-02-22 ENCOUNTER — Other Ambulatory Visit: Payer: Self-pay | Admitting: Nurse Practitioner

## 2022-02-22 MED ORDER — TESTOSTERONE CYPIONATE 200 MG/ML IM SOLN
INTRAMUSCULAR | 0 refills | Status: DC
Start: 2022-02-22 — End: 2022-12-13

## 2022-02-22 MED ORDER — "BD ECLIPSE SYRINGE 21G X 1"" 3 ML MISC": 0 refills | Status: AC

## 2022-03-04 ENCOUNTER — Encounter: Payer: Self-pay | Admitting: Internal Medicine

## 2022-03-04 ENCOUNTER — Ambulatory Visit: Payer: 59 | Admitting: Internal Medicine

## 2022-03-04 VITALS — BP 156/88 | HR 60 | Temp 97.1°F | Ht 77.0 in | Wt 244.2 lb

## 2022-03-04 DIAGNOSIS — H6593 Unspecified nonsuppurative otitis media, bilateral: Secondary | ICD-10-CM

## 2022-03-04 DIAGNOSIS — M5412 Radiculopathy, cervical region: Secondary | ICD-10-CM

## 2022-03-04 DIAGNOSIS — H9313 Tinnitus, bilateral: Secondary | ICD-10-CM

## 2022-03-04 DIAGNOSIS — R0989 Other specified symptoms and signs involving the circulatory and respiratory systems: Secondary | ICD-10-CM | POA: Diagnosis not present

## 2022-03-04 MED ORDER — DEXAMETHASONE 4 MG PO TABS: ORAL_TABLET | ORAL | 0 refills | Status: AC

## 2022-03-04 MED ORDER — PSEUDOEPHEDRINE HCL ER 120 MG PO TB12: ORAL_TABLET | ORAL | 3 refills | Status: AC

## 2022-03-04 MED ORDER — CYCLOBENZAPRINE HCL 10 MG PO TABS: ORAL_TABLET | ORAL | 0 refills | Status: AC

## 2022-03-04 NOTE — Progress Notes (Signed)
Future Appointments  Date Time Provider Department  03/04/2022  4:00 PM [name], MD [facility]  04/23/2022                 CPE  9:00 AM Darrol Jump, NP [facility]    History of Present Illness:                   This very nice 61 y.o. MWM  with HTN, HLD, T1_IDDM (LADA),  Hypothyroidism,  OSA /CPAP,  Gout and Vitamin D Deficiency presents with concerns of 1 month prodrome of high pitched ringing of his ears partially relieved by [place]/ self Insflation. Also for the last week has had sx's of Cx radiculitis to the left shoulder.     Medications  Current Outpatient Medications (Endocrine & Metabolic):    dexamethasone (DECADRON) 4 MG tablet, Take 1 tab 3 x /day for 2 days,      then 2 x /day for 2  Days,     then 1 tab daily   insulin glargine (LANTUS) 100 UNIT/ML injection, Inject 0.2 mLs (20 Units total) into the skin at bedtime.   Insulin Lispro Prot & Lispro (HUMALOG MIX 75/25 KWIKPEN) (75-25) 100 UNIT/ML Kwikpen, INJECT SUBCUTANEOUSLY 30  UNITS WITH FIRST MEAL OF  THE DAY AND 30 UNITS WITH  LARGEST MEAL OF THE DAY  PRIOR TO BEDTIME   levothyroxine (SYNTHROID) 150 MCG tablet, Take 1 tablet daily on an empty stomach with only water for 30 minutes & no Antacid meds, Calcium or Magnesium for 4 hours & avoid Biotin.   metFORMIN (GLUCOPHAGE-XR) 500 MG 24 hr tablet, TAKE 1 TABLET WITH  BREAKFAST AND LUNCH, AND 2  TABLETS WITH SUPPER FOR  DIABETES   testosterone cypionate (DEPOTESTOSTERONE CYPIONATE) 200 MG/ML injection, INJECT 1 ML (200 mg) AS DIRECTED INTRAMUSCULARLY EVERY 14  DAYS  Current Outpatient Medications (Cardiovascular):    ezetimibe (ZETIA) 10 MG tablet, Take  1 tablet  Daily  for Cholesterol   olmesartan (BENICAR) 20 MG tablet, Take 1 tablet (20 mg total) by mouth daily.  Current Outpatient Medications (Respiratory):    pseudoephedrine (SUDAFED) 120 MG 12 hr tablet, Take  1 tablet  2 x /day (every 12 hours)  for Congestion & Ear Fluid  Current Outpatient  Medications (Analgesics):    allopurinol (ZYLOPRIM) 100 MG tablet, TAKE 1 TABLET BY MOUTH  DAILY TO PREVENT GOUT   Current Outpatient Medications (Other):    BD PEN NEEDLE NANO U/F 32G X 4 MM MISC, USE TWICE A DAY AS DIRECTED   Cholecalciferol (VITAMIN D PO), Take 4,000 Int'l Units by mouth daily.   CINNAMON PO, Take 1,000 mg by mouth.   Continuous Blood Gluc Sensor (FREESTYLE LIBRE 14 DAY SENSOR) MISC, APPLY NEW SENSOR EVERY 14  DAYS TO CHECK BLOOD SUGAR 4 TIMES DAILY TO MANAGE  INSULIN   Continuous Blood Gluc Sensor MISC, 1 each by Does not apply route as directed. Check blood sugar 3 times a day Dx:E10.9   cyclobenzaprine (FLEXERIL) 10 MG tablet, Take 1/2 to 1 tablet 3 x /day as needed for Muscle Spasm   glucose blood test strip, Check blood sugar 3 times a day-DX-E13.9.   NEEDLE, DISP, 21 G (BD ECLIPSE NEEDLE) 21G X 1" MISC, Use to inject 2 mL intramuscularly every 14 days.   pantoprazole (PROTONIX) 40 MG tablet, Take 1 tablet (40 mg total) by mouth daily.   SYRINGE-NEEDLE, DISP, 3 ML (BD ECLIPSE SYRINGE) 21G X 1" 3 ML MISC,  Use to inject 2 mL intramuscularly every 14 days  Problem list He has Hypothyroidism; Erectile dysfunction associated with type 2 diabetes mellitus ([place]); RENAL CYST; OSA on CPAP; NEPHROLITHIASIS, HX OF; Testosterone deficiency; Mixed diabetic hyperlipidemia associated with type 1 diabetes mellitus ([place]); Hypertension; Medication management; Vitamin D deficiency; LADA (latent autoimmune diabetes in adults), managed as type 1 ([place]); Aortic atherosclerosis ([place]) by CXR 03/11/2020; Overweight (BMI 25.0-29.9); and Chronic gout without tophus on their problem list.   Observations/Objective:  BP (!) 156/88   Pulse 60   Temp (!) 97.1 F (36.2 C)   Ht '6\' 5"'$  (1.956 m)   Wt 244 lb 3.2 oz (110.8 kg)   SpO2 95%   BMI 28.96 kg/m   HEENT -  TMs sl dull & retracted. No sinus tenderness. N/O/P - clear.  Neck - supple. Car 2+/2+ no Br / N  / JVD Chest - Clear equal BS. Cor -  Nl HS. RRR w/o sig MGR. PP 1(+). No edema. MS- FROM w/o deformities.  Gait Nl. Neuro -  Nl w/o focal abnormalities. DTRs UE symm   Assessment and Plan:  1. Bilateral serous otitis media, unspecified chronicity  - dexamethasone 4 MG tablet Take 1 tab 3 x /day for 2 days, then 2 x /day for 2  Days, then 1 tab daily   Dispense: 13 tablet  - pseudoephedrine  120 MG 12 hr tablet  Take  1 tablet  2 x /day (every 12 hours)   Dispense: 60 tablet; Refill: 3  2. Tinnitus of both ears  - dexamethasone  4 MG tablet Take 1 tab 3 x /day for 2 days,  then 2 x /day for 2  Days, then 1 tab daily   Dispense: 13 tablet  3. Cervical radiculitis  - dexamethasone 4 MG tablet  Take 1 tab 3 x /day for 2 days, then 2 x /day for 2  Days, then 1 tab daily   Dispense: 13 tablet  - cyclobenzaprine 10 MG tablet Take 1/2 to 1 tablet 3 x /day as needed for Muscle Spasm  Dispense: 90 tablet  4. Labile hypertension  - Monitor  Follow Up Instructions:        I discussed the assessment and treatment plan with the patient. The patient was provided an opportunity to ask questions and all were answered. The patient agreed with the plan and demonstrated an understanding of the instructions.       The patient was advised to call back or seek an in-person evaluation if the symptoms worsen or if the condition fails to improve as anticipated.   Kirtland Bouchard, MD

## 2022-03-04 NOTE — Patient Instructions (Signed)
Otitis Media With Effusion, Adult  Otitis media with effusion (OME) is inflammation and fluid (effusion) in the middle ear without having an ear infection. The middle ear is the space behind the eardrum. The middle ear is connected to the back of the throat by a narrow tube (eustachian tube). Normally the eustachian tube drains fluid out of the middle ear. A swollen eustachian tube can become blocked and cause fluid to collect in the middle ear. OME often goes away without treatment. Sometimes OME can lead to hearing problems and recurrent acute ear infections (acute otitis media). These conditions may require treatment. What are the causes? OME is caused by a blocked eustachian tube. This can result from: Allergies. Upper respiratory infections. Enlarged adenoids. The adenoids are areas of soft tissue located high in the back of the throat, behind the nose and the roof of the mouth. They are part of the body's natural defense system (immune system). Rapid changes in pressure, like when an airplane is descending or during scuba diving. In some cases, the cause of this condition is not known. What are the signs or symptoms? Common symptoms of this condition include: A feeling of fullness in your ear. Decreased hearing in the affected ear. Fluid draining into the ear canal. Pain in the ear. In some cases, there are no symptoms. How is this diagnosed ? A health care provider can diagnose OME based on signs and symptoms of the condition. Your provider will also do a physical exam to check for fluid behind the eardrum. During the exam, your health care provider will use an instrument called an otoscope to look in your ear. Your health care provider may do other tests, such as: A hearing test. A tympanogram. This is a test that shows how well the eardrum moves in response to air pressure in the ear canal. It provides a graph for your health care provider to review. A pneumatic otoscopy. This is a  test to check how your eardrum moves in response to changes in pressure. It is done by squeezing a small amount of air into the ear. How is this treated? Treatment for OME depends on the cause of the condition and the severity of symptoms. The first step is often waiting to see if the fluid drains on its own in a few weeks. Home care treatment may include: Over-the-counter pain relievers. A warm, moist cloth placed over the ear. Severe cases may require a procedure to insert tubes in the ears (tympanostomy tubes) to drain the fluid. Follow these instructions at home: Take over-the-counter and prescription medicines only as told by your health care provider. Keep all follow-up visits. Contact a health care provider if: You have pain that gets worse. Hearing in your affected ear gets worse. You have fluid draining from your ear canal. You have dizziness. You develop a fever. Get help right away if: You develop a severe headache. You completely lose hearing in the affected ear. You have bleeding from your ear canal. You have sudden and severe pain in your ear. These symptoms may represent a serious problem that is an emergency. Do not wait to see if the symptoms will go away. Get medical help right away. Call your local emergency services (911 in the [place] Do not drive yourself to the hospital. Summary Otitis media with effusion (OME) is inflammation and fluid (effusion) in the middle ear without having an ear infection. A swollen eustachian tube can become blocked and cause fluid to collect in the  middle ear. Treatment for OME depends on the cause of the condition and the severity of symptoms. Many times, treatment is not needed because the fluid drains on its own in a few weeks. Sometimes OME can lead to hearing problems and recurrent acute ear infections (acute otitis media), which may require treatment. =============================== Tinnitus  Tinnitus refers to hearing a sound when  there is no actual source for that sound. This is often described as ringing in the ears. However, people with this condition may hear a variety of noises, in one ear or in both ears. The sounds of tinnitus can be soft, loud, or somewhere in between. Tinnitus can last for a few seconds or can be constant for days. It may go away without treatment and come back at various times. When tinnitus is constant or happens often, it can lead to other problems, such as trouble sleeping and trouble concentrating. Almost everyone experiences tinnitus at some point. Tinnitus is not the same as hearing loss. Tinnitus that is long-lasting (chronic) or comes back often (recurs) may require medical attention. What are the causes? The cause of tinnitus is often not known. In some cases, it can result from: Exposure to loud noises from machinery, music, or other sources. An object (foreign body) stuck in the ear. Earwax buildup. Drinking alcohol or caffeine. Taking certain medicines. Age-related hearing loss. It may also be caused by medical conditions such as: Ear or sinus infections. Heart diseases or high blood pressure. Allergies. Mnire's disease. Thyroid problems. Tumors. A weak, bulging blood vessel (aneurysm) near the ear. What increases the risk? The following factors may make you more likely to develop this condition: Exposure to loud noises. Age. Tinnitus is more likely in older individuals. Using alcohol or tobacco. What are the signs or symptoms? The main symptom of tinnitus is hearing a sound when there is no source for that sound. It may sound like: Buzzing. Sizzling. Ringing. Blowing air. Hissing. Whistling. Other sounds may include: Roaring. Running water. A musical note. Tapping. Humming. Symptoms may affect only one ear (unilateral) or both ears (bilateral). How is this diagnosed? Tinnitus is diagnosed based on your symptoms, your medical history, and a physical exam. Your  health care provider may do a thorough hearing test (audiologic exam) if your tinnitus: Is unilateral. Causes hearing difficulties. Lasts 6 months or longer. You may work with a health care provider who specializes in hearing disorders (audiologist). You may be asked questions about your symptoms and how they affect your daily life. You may have other tests done, such as: CT scan. MRI. An imaging test of how blood flows through your blood vessels (angiogram). How is this treated? Treating an underlying medical condition can sometimes make tinnitus go away. If your tinnitus continues, other treatments may include: Therapy and counseling to help you manage the stress of living with tinnitus. Sound generators to mask the tinnitus. These include: Tabletop sound machines that play relaxing sounds to help you fall asleep. Wearable devices that fit in your ear and play sounds or music. Acoustic neural stimulation. This involves using headphones to listen to music that contains an auditory signal. Over time, listening to this signal may change some pathways in your brain and make you less sensitive to tinnitus. This treatment is used for very severe cases when no other treatment is working. Using hearing aids or cochlear implants if your tinnitus is related to hearing loss. Hearing aids are worn in the outer ear. Cochlear implants are surgically placed in  the inner ear. Follow these instructions at home: Managing symptoms   When possible, avoid being in loud places and being exposed to loud sounds. Wear hearing protection, such as earplugs, when you are exposed to loud noises. Use a white noise machine, a humidifier, or other devices to mask the sound of tinnitus. Practice techniques for reducing stress, such as meditation, yoga, or deep breathing. Work with your health care provider if you need help with managing stress. Sleep with your head slightly raised. This may reduce the impact of  tinnitus. General instructions Do not use stimulants, such as nicotine, alcohol, or caffeine. Talk with your health care provider about other stimulants to avoid. Stimulants are substances that can make you feel alert and attentive by increasing certain activities in the body (such as heart rate and blood pressure). These substances may make tinnitus worse. Take over-the-counter and prescription medicines only as told by your health care provider. Try to get plenty of sleep each night. Keep all follow-up visits. This is important. Contact a health care provider if: Your tinnitus continues for 3 weeks or longer without stopping. You develop sudden hearing loss. Your symptoms get worse or do not get better with home care. You feel you are not able to manage the stress of living with tinnitus. Get help right away if: You develop tinnitus after a head injury. You have tinnitus along with any of the following: Dizziness. Nausea and vomiting. Loss of balance. Sudden, severe headache. Vision changes. Facial weakness or weakness of arms or legs. These symptoms may represent a serious problem that is an emergency. Do not wait to see if the symptoms will go away. Get medical help right away. Call your local emergency services (911 in the [place] Do not drive yourself to the hospital. Summary Tinnitus refers to hearing a sound when there is no actual source for that sound. This is often described as ringing in the ears. Symptoms may affect only one ear (unilateral) or both ears (bilateral). Use a white noise machine, a humidifier, or other devices to mask the sound of tinnitus. Do not use stimulants, such as nicotine, alcohol, or caffeine. These substances may make tinnitus worse. ==================================  Radicular Pain Radicular pain is a type of pain that spreads from your back or neck along a spinal nerve.   Spinal nerves are nerves that leave the spinal cord and go to the muscles.    Radicular pain is sometimes called radiculopathy, radiculitis, or a pinched nerve.   When you have this type of pain, you may also have weakness, numbness, or tingling in the area of your body that is supplied by the nerve. The pain may feel sharp and burning.    Neck area (cervical radicular pain). You may also feel pain, numbness, weakness, or tingling in the neck, shoulder  blade areas or  arms.  Mid-spine area (thoracic radicular pain). You would feel this pain in the back and chest. This type is rare. Lower back area (lumbar radicular pain). You would feel this pain as low back pain. You may feel pain, numbness, weakness, or tingling in the buttocks or legs. Sciatica is a type of lumbar radicular pain that shoots down the back of the leg.  Radicular pain occurs when one of the spinal nerves becomes irritated or squeezed (compressed).   It is often caused by something pushing on a spinal nerve, such as one of the bones of the spine (vertebrae) or one of the round cushions between vertebrae (intervertebral  disks).   This can result from: An injury.  Wear and tear or aging of a disk bulging or rupture .  The growth of a bone spur that pushes on the nerve.  Radicular pain often goes away when you follow instructions from your health care provider for relieving pain at home.  How is this treated?  Treatment may depend on the cause of the condition and may include: Working with a physical therapist.  Taking pain medicine.  Applying heat or ice or both to the affected areas.  Doing stretches to improve flexibility.  Having surgery. This may be needed if other treatments do not help.   Different types of surgery may be done depending on the cause of this condition.  Follow these instructions at home: Managing pain     If directed, put ice on the affected area. To do this:  Put ice in a plastic bag.  Place a towel between your skin and the bag.  Leave the ice on for  20 minutes, 2-3 times a day.\  Remove the ice if your skin turns bright red.   This is very important. If you cannot feel pain, heat, or cold, you have a greater risk of damage to the area.  If directed, apply heat to the affected area as often as told by your health care provider.   Use the heat source that your health care provider recommends, such as a moist heat pack or a heating pad.  Place a towel between your skin and the heat source.  Leave the heat on for 20-30 minutes.  Remove the heat if your skin turns bright red.   This is especially important if you are unable to feel pain, heat, or cold. You have a greater risk of getting burned.  Activity  Do not sit or rest in bed for long periods of time. Try to stay as active as possible. Ask your health care provider what type of exercise or activity is best for you. Avoid activities that make your pain worse, such as bending and lifting. You may have to avoid lifting. Ask your health care provider how much you can safely lift. Practice using proper technique when lifting items. Proper lifting technique involves bending your knees and rising up. Do strength and range-of-motion exercises only as told by your health care provider or physical therapist.  General instructions  Take over-the-counter and prescription medicines only as told by your health care provider. Pay attention to any changes in your symptoms. Keep all follow-up visits. This is important.  Contact a health care provider if:  Your pain and other symptoms get worse. Your pain medicine is not helping. Your pain has not improved after a few weeks of home care. You have a fever.  Get help right away if:  You have severe pain, weakness, or numbness. You have difficulty with bladder or bowel control.  Summary  Radicular pain is a type of pain that spreads from your back or neck along a spinal nerve.  When you have radicular pain,                                        you may also have weakness, numbness, or tingling in  the area of your body that is supplied by the nerve.  The pain may feel sharp or burning.  Radicular pain may be treated with ice, heat, medicines, or physical therapy.

## 2022-03-17 ENCOUNTER — Encounter: Payer: 59 | Admitting: Adult Health

## 2022-03-24 ENCOUNTER — Other Ambulatory Visit: Payer: Self-pay | Admitting: Nurse Practitioner

## 2022-04-23 ENCOUNTER — Encounter: Payer: 59 | Admitting: Nurse Practitioner

## 2022-05-07 ENCOUNTER — Encounter: Payer: 59 | Admitting: Nurse Practitioner

## 2022-05-14 ENCOUNTER — Ambulatory Visit (INDEPENDENT_AMBULATORY_CARE_PROVIDER_SITE_OTHER): Payer: 59 | Admitting: Nurse Practitioner

## 2022-05-14 ENCOUNTER — Encounter: Payer: Self-pay | Admitting: Nurse Practitioner

## 2022-05-14 VITALS — BP 148/100 | HR 52 | Temp 97.3°F | Ht 77.0 in | Wt 239.0 lb

## 2022-05-14 DIAGNOSIS — I10 Essential (primary) hypertension: Secondary | ICD-10-CM | POA: Diagnosis not present

## 2022-05-14 DIAGNOSIS — E1169 Type 2 diabetes mellitus with other specified complication: Secondary | ICD-10-CM

## 2022-05-14 DIAGNOSIS — E1069 Type 1 diabetes mellitus with other specified complication: Secondary | ICD-10-CM

## 2022-05-14 DIAGNOSIS — G4733 Obstructive sleep apnea (adult) (pediatric): Secondary | ICD-10-CM

## 2022-05-14 DIAGNOSIS — E538 Deficiency of other specified B group vitamins: Secondary | ICD-10-CM

## 2022-05-14 DIAGNOSIS — E139 Other specified diabetes mellitus without complications: Secondary | ICD-10-CM

## 2022-05-14 DIAGNOSIS — E039 Hypothyroidism, unspecified: Secondary | ICD-10-CM

## 2022-05-14 DIAGNOSIS — Z79899 Other long term (current) drug therapy: Secondary | ICD-10-CM

## 2022-05-14 DIAGNOSIS — Z136 Encounter for screening for cardiovascular disorders: Secondary | ICD-10-CM | POA: Diagnosis not present

## 2022-05-14 DIAGNOSIS — Z0000 Encounter for general adult medical examination without abnormal findings: Secondary | ICD-10-CM | POA: Diagnosis not present

## 2022-05-14 DIAGNOSIS — Z1389 Encounter for screening for other disorder: Secondary | ICD-10-CM

## 2022-05-14 DIAGNOSIS — I700 Atherosclerosis of aorta: Secondary | ICD-10-CM

## 2022-05-14 DIAGNOSIS — N281 Cyst of kidney, acquired: Secondary | ICD-10-CM

## 2022-05-14 DIAGNOSIS — E349 Endocrine disorder, unspecified: Secondary | ICD-10-CM

## 2022-05-14 DIAGNOSIS — E559 Vitamin D deficiency, unspecified: Secondary | ICD-10-CM

## 2022-05-14 DIAGNOSIS — Z125 Encounter for screening for malignant neoplasm of prostate: Secondary | ICD-10-CM

## 2022-05-14 DIAGNOSIS — Z0001 Encounter for general adult medical examination with abnormal findings: Secondary | ICD-10-CM

## 2022-05-14 DIAGNOSIS — Z87442 Personal history of urinary calculi: Secondary | ICD-10-CM

## 2022-05-14 DIAGNOSIS — M1A9XX0 Chronic gout, unspecified, without tophus (tophi): Secondary | ICD-10-CM

## 2022-05-14 NOTE — Patient Instructions (Signed)

## 2022-05-14 NOTE — Progress Notes (Signed)
Complete Physical  Assessment and Plan:  Encounter for Annual Physical Exam with abnormal findings Due annually  Health Maintenance reviewed Healthy lifestyle reviewed and goals set  Atherosclerosis of aorta ([place]) - per CXR 02/2020 Continue Zetia Discussed lifestyle modifications. Recommended diet heavy in fruits and veggies, omega 3's. Decrease consumption of animal meats, cheeses, and dairy products. Remain active and exercise as tolerated. Continue to monitor. Check and monitor lipids/TSH   Mixed diabetic hyperlipidemia associated with type 1 diabetes mellitus (HCC) Continue Lantus 20U QHS Continue Humalog 10-15 AC and 15 at dinner Averages <180 Continue Metformin 500 mg at dinner  Education: Reviewed 'ABCs' of diabetes management  Discussed goals to be met and/or maintained include A1C (<7) Blood pressure (<130/80) Cholesterol (LDL <70) Continue Eye Exam yearly  Continue Dental Exam Q6 mo Discussed dietary recommendations Discussed Physical Activity recommendations Foot exam UTD Check and monitor A1C   LADA (latent autoimmune diabetes in adults), managed as type 1 ([place]) Continue medications. Discussed general issues about diabetes pathophysiology and management., Educational material distributed., Suggested low cholesterol diet., Encouraged aerobic exercise., Discussed foot care., Reminded to get yearly retinal exam - completed 2023.  Erectile dysfunction associated with type 2 diabetes mellitus (HCC) Continue supplement 200 mg q 2 weeks. Continue zinc. Monitor levels.   Hypothyroidism, s/p thyroidectomy in 1988, hx of thyroid cancer Controlled. Continue Levothyroxine 300 mg QOD. Reminded to take on an empty stomach 30-75mns before food.  Stop any Biotin Supplement 48-72 hours before next TSH level to reduce the risk of falsely low TSH levels. Continue to monitor.     Essential hypertension Continue olmesartan 20 mg qd Discussed DASH (Dietary Approaches to  Stop Hypertension) DASH diet is lower in sodium than a typical American diet. Cut back on foods that are high in saturated fat, cholesterol, and trans fats. Eat more whole-grain foods, fish, poultry, and nuts Remain active and exercise as tolerated daily.  Monitor BP at home-Call if greater than 130/80.  Check and monitor CMP/CBC   OSA on CPAP Sleep apnea- continue CPAP, CPAP is helping with daytime fatigue, weight loss still advised.   Medication management All medications discussed and reviewed in full. All questions and concerns regarding medications addressed.     Vitamin D deficiency Continue supplement Monitor levels  Testosterone deficiency Continued perceived benefits with supplementation, no SE. Check level, recommended zinc supplement, no symptoms of elevated estrogen or dihydrotestosterone.   NEPHROLITHIASIS, HX OF Follow with Urology PRN Stay well hydrated. Remain active and exercise as tolerated daily. Maintain weight.   RENAL CYST Simple cyst per [place] Gout No longer allopurinol Low purine diet discussed Monitor Uric Acid levels  Screening PSA (prostate specific antigen) Following with Urology Check and monitor PSA levels  Screening for protein uria or hematuria Check and monitor UA  Screening for ischemic heart disease Check and monitor EKG  Orders Placed This Encounter  Procedures   CBC with Differential/Platelet   COMPLETE METABOLIC PANEL WITH GFR   Magnesium   Lipid panel   TSH   Hemoglobin A1c   VITAMIN D 25 Hydroxy (Vit-D Deficiency, Fractures)   Urinalysis, Routine w reflex microscopic   Testosterone   PSA   Uric acid   EKG 12-Lead    Notify office for further evaluation and treatment, questions or concerns if any reported s/s fail to improve.   The patient was advised to call back or seek an in-person evaluation if any symptoms worsen or if the condition fails to improve as anticipated.  Further disposition pending results of  labs. Discussed med's effects and SE's.    I discussed the assessment and treatment plan with the patient. The patient was provided an opportunity to ask questions and all were answered. The patient agreed with the plan and demonstrated an understanding of the instructions.  Discussed med's effects and SE's. Screening labs and tests as requested with regular follow-up as recommended.  I provided 30 minutes of face-to-face time during this encounter including counseling, chart review, and critical decision making was preformed.  Future Appointments  Date Time Provider [place]  05/16/2023  9:00 AM Darrol Jump, NP [facility] None    HPI 61 y.o. male patient presents for a complete physical.  He has Hypothyroidism; Erectile dysfunction associated with type 2 diabetes mellitus ([place]); RENAL CYST; OSA on CPAP; NEPHROLITHIASIS, HX OF; Testosterone deficiency; Mixed diabetic hyperlipidemia associated with type 1 diabetes mellitus ([place]); Hypertension; Medication management; Vitamin D deficiency; LADA (latent autoimmune diabetes in adults), managed as type 1 ([place]); Aortic atherosclerosis ([place]) by CXR 03/11/2020; Overweight (BMI 25.0-29.9); and Chronic gout without tophus on their problem list.  He is married, 1 bio child and 2 step kids, 7 grandchildren.  He works as Occupational psychologist.   He has been camping the last two weeks at [employer].  Reports forgetting his pill box on the bathroom counter at home and has been without most of his medications.  His BP is above goal today.  He will be returning home this evening to collect.  He has OSA on CPAP, endorses 100% compliance and no concerns.   BMI is Body mass index is 28.34 kg/m., he has been working on diet though admits not enough exercise, trying to use the gym at work more often. He reports 12 cups of coffee daily (unchanged since childhood), 4-6 glasses of water, 1 beer at night with occasionally 2 bourbons.  He has lost 5 lb in  the last two months.  Wt Readings from Last 3 Encounters:  05/14/22 239 lb (108.4 kg)  03/04/22 244 lb 3.2 oz (110.8 kg)  01/21/22 245 lb (111.1 kg)   His blood pressure has been controlled at home, today their BP is BP: (!) 148/100 He does not workout but is active out side. He denies chest pain, shortness of breath, dizziness.   Former cigar smoker Q in 2014- VERY RARE USE. He has aortic atherosclerosis per CXR 02/2020.   He has been working on diet and exercise for diabetes due to LADA  metformin and insulin humalog 75/25 mix, 15 units AM, 15 units PM Has had issues with control, labile, recently was able to get CGM covered,  Denies foot ulcerations, hyperglycemia, increased appetite, nausea, paresthesia of the feet, polydipsia, polyuria, visual disturbances, vomiting and weight loss.  Lab Results  Component Value Date   HGBA1C 8.0 (H) 01/21/2022   Had Renal US 2011 showing simple R cyst and non-obstructing renal calculi. He had remoted by cysto/lithotripsy by Dr. Gaynelle Arabian. Last GFR: Lab Results  Component Value Date   GFRNONAA 87 11/07/2020   LDL is not at goal <70, he reports had aching with rosuvastatin 20 mg and stopped. Willing  Lab Results  Component Value Date   CHOL 174 01/21/2022   HDL 47 01/21/2022   LDLCALC 106 (H) 01/21/2022   LDLDIRECT 127.3 10/01/2009   TRIG 118 01/21/2022   CHOLHDL 3.7 01/21/2022   He is on thyroid medication s/p thyroidectomy following thyroid cancer in 80s.  His medication was changed last visit,  he reports has been taking 300 mcg every other day, taking with water only since last visit.  Lab Results  Component Value Date   TSH 0.96 01/21/2022  .  He has a history of testosterone deficiency and is on testosterone replacement. Taking 200 mg every 2 weeks. He states that the testosterone helps with his energy, libido, muscle mass. Denies symptoms of elevated estrogen or dihydrotestosterone.  Lab Results  Component Value Date    TESTOSTERONE 375 10/08/2021    Patient is not taking Vitamin D supplement.   Lab Results  Component Value Date   VD25OH 40 07/07/2020     Last PSA was: Lab Results  Component Value Date   PSA 0.16 03/17/2021   Patient is on allopurinol 100 mg daily for gout and does not report a recent flare. He questions this diagnosis would like to try getting off med. Reports has never had flare. Has reduced beer intake.  Lab Results  Component Value Date   LABURIC 4.0 06/22/2021     Current Medications:   Current Outpatient Medications (Endocrine & Metabolic):    insulin glargine (LANTUS) 100 UNIT/ML injection, Inject 0.2 mLs (20 Units total) into the skin at bedtime.   Insulin Lispro Prot & Lispro (HUMALOG MIX 75/25 KWIKPEN) (75-25) 100 UNIT/ML Kwikpen, INJECT SUBCUTANEOUSLY 30 UNITS  WITH FIRST MEAL OF THE DAY AND  30 UNITS WITH LARGEST MEAL OF  THE DAY (AT DINNER) PRIOR TO AT  BEDTIME   levothyroxine (SYNTHROID) 150 MCG tablet, Take 1 tablet daily on an empty stomach with only water for 30 minutes & no Antacid meds, Calcium or Magnesium for 4 hours & avoid Biotin.   metFORMIN (GLUCOPHAGE-XR) 500 MG 24 hr tablet, TAKE 1 TABLET WITH  BREAKFAST AND LUNCH, AND 2  TABLETS WITH SUPPER FOR  DIABETES (Patient taking differently: Take 1 tablet in the evening)   testosterone cypionate (DEPOTESTOSTERONE CYPIONATE) 200 MG/ML injection, INJECT 1 ML (200 mg) AS DIRECTED INTRAMUSCULARLY EVERY 14  DAYS   dexamethasone (DECADRON) 4 MG tablet, Take 1 tab 3 x /day for 2 days,      then 2 x /day for 2  Days,     then 1 tab daily  Current Outpatient Medications (Cardiovascular):    ezetimibe (ZETIA) 10 MG tablet, Take  1 tablet  Daily  for Cholesterol   olmesartan (BENICAR) 20 MG tablet, Take 1 tablet (20 mg total) by mouth daily.  Current Outpatient Medications (Respiratory):    pseudoephedrine (SUDAFED) 120 MG 12 hr tablet, Take  1 tablet  2 x /day (every 12 hours)  for Congestion & Ear Fluid  Current  Outpatient Medications (Analgesics):    allopurinol (ZYLOPRIM) 100 MG tablet, TAKE 1 TABLET BY MOUTH  DAILY TO PREVENT GOUT   Current Outpatient Medications (Other):    BD PEN NEEDLE NANO U/F 32G X 4 MM MISC, USE TWICE A DAY AS DIRECTED   Cholecalciferol (VITAMIN D PO), Take 4,000 Int'l Units by mouth daily.   CINNAMON PO, Take 1,000 mg by mouth.   Continuous Blood Gluc Sensor (FREESTYLE LIBRE 14 DAY SENSOR) MISC, APPLY NEW SENSOR EVERY 14  DAYS TO CHECK BLOOD SUGAR 4 TIMES DAILY TO MANAGE  INSULIN   Continuous Blood Gluc Sensor MISC, 1 each by Does not apply route as directed. Check blood sugar 3 times a day Dx:E10.9   cyclobenzaprine (FLEXERIL) 10 MG tablet, Take 1/2 to 1 tablet 3 x /day as needed for Muscle Spasm   glucose blood test strip, Check  blood sugar 3 times a day-DX-E13.9.   NEEDLE, DISP, 21 G (BD ECLIPSE NEEDLE) 21G X 1" MISC, Use to inject 2 mL intramuscularly every 14 days.   SYRINGE-NEEDLE, DISP, 3 ML (BD ECLIPSE SYRINGE) 21G X 1" 3 ML MISC, Use to inject 2 mL intramuscularly every 14 days   pantoprazole (PROTONIX) 40 MG tablet, Take 1 tablet (40 mg total) by mouth daily. (Patient not taking: Reported on 05/14/2022)  Allergies:  Allergies  Allergen Reactions   Shellfish Allergy Anaphylaxis and Rash   Atorvastatin Other (See Comments)    Myalgias   Health Maintenance:  Immunization History  Administered Date(s) Administered   Influenza Inj Mdck Quad With Preservative 07/07/2020   Influenza Split 06/12/2013   Influenza Whole 06/30/2009   Influenza,inj,Quad PF,6+ Mos 06/22/2021   Influenza-Unspecified 06/25/2017, 06/26/2019   Moderna Sars-Covid-2 Vaccination 02/09/2020, 03/19/2020   PPD Test 10/29/2013, 10/30/2014, 11/12/2015   Td 01/08/2004   Tdap 10/29/2013   Tetanus: 2015 Influenza: Due 05/2022 Pneumonia: declines this visit  Shingrix: check with insurance Covid 19: 2/2, moderna, 2021 -   Colonoscopy: 2017 Dr. Earlean Shawl repeat 10 years  Last eye exam: eye  provider at [facility], within the last year, 2023 Last dental: last 2023, goes q39m Patient Care Team: MUnk Pinto MD as PCP - General (Internal Medicine)  Medical History:  has Hypothyroidism; Erectile dysfunction associated with type 2 diabetes mellitus ([place]; RENAL CYST; OSA on CPAP; NEPHROLITHIASIS, HX OF; Testosterone deficiency; Mixed diabetic hyperlipidemia associated with type 1 diabetes mellitus ([place]; Hypertension; Medication management; Vitamin D deficiency; LADA (latent autoimmune diabetes in adults), managed as type 1 ([place]; Aortic atherosclerosis ([place] by CXR 03/11/2020; Overweight (BMI 25.0-29.9); and Chronic gout without tophus on their problem list. Surgical History:  He  has a past surgical history that includes Thyroidectomy (2001); Cystoscopy/retrograde/ureteroscopy/stone extraction with basket (Right, 01/19/2016); Cystoscopy with holmium laser lithotripsy (Right, 01/19/2016); and Colonoscopy. Family History:  His family history includes Diabetes in his father; Diabetes (age of onset: 469 in his sister; Hypertension in his brother and father; Prostate cancer in his maternal grandfather and paternal grandfather; Thyroid cancer in his paternal grandmother. Social History:   reports that he quit smoking about 8 years ago. His smoking use included cigars. He has never used smokeless tobacco. He reports current alcohol use of about 7.0 standard drinks of alcohol per week. He reports that he does not use drugs.  Review of Systems:  Review of Systems  Constitutional:  Negative for malaise/fatigue and weight loss.  HENT:  Negative for hearing loss and tinnitus.   Eyes:  Negative for blurred vision and double vision.  Respiratory:  Negative for cough, shortness of breath and wheezing.   Cardiovascular:  Negative for chest pain, palpitations, orthopnea, claudication and leg swelling.  Gastrointestinal:  Negative for abdominal pain, blood in stool, constipation, diarrhea, heartburn,  melena, nausea and vomiting.  Genitourinary: Negative.   Musculoskeletal:  Negative for joint pain and myalgias.  Skin:  Negative for rash.  Neurological:  Negative for dizziness, tingling, sensory change, weakness and headaches.  Endo/Heme/Allergies:  Negative for polydipsia.  Psychiatric/Behavioral: Negative.    All other systems reviewed and are negative.   Physical Exam: Estimated body mass index is 28.34 kg/m as calculated from the following:   Height as of this encounter: 6' 5"  (1.956 m).   Weight as of this encounter: 239 lb (108.4 kg). BP (!) 148/100   Pulse (!) 52   Temp (!) 97.3 F (36.3 C)   Ht 6' 5"  (  1.956 m)   Wt 239 lb (108.4 kg)   SpO2 92%   BMI 28.34 kg/m  General Appearance: Well nourished, in no apparent distress.  Eyes: PERRLA, EOMs, conjunctiva no swelling or erythema, normal fundi and vessels.  Sinuses: No Frontal/maxillary tenderness  ENT/Mouth: Ext aud canals clear, normal light reflex with TMs without erythema, bulging. Good dentition. No erythema, swelling, or exudate on post pharynx. Tonsils not swollen or erythematous. Hearing normal.  Neck: Supple, thyroid normal. No bruits  Respiratory: Respiratory effort normal, BS equal bilaterally without rales, rhonchi, wheezing or stridor.  Cardio: RRR without murmurs, rubs or gallops. Brisk peripheral pulses without edema.  Chest: symmetric, with normal excursions and percussion.  Abdomen: Soft, nontender, no guarding, rebound, hernias, masses, or organomegaly.  Lymphatics: Non tender without lymphadenopathy.  Genitourinary: denies concerns Musculoskeletal: Full ROM all peripheral extremities,5/5 strength, and normal gait. + varicose veins bilateral legs, no erythema, hard cord, neg homen's Skin: Warm, dry without rashes, lesions, ecchymosis. Neuro: Cranial nerves intact, reflexes equal bilaterally. Normal muscle tone, no cerebellar symptoms. Sensation intact bil feet to monofilament.  Psych: Awake and oriented  X 3, normal affect, Insight and Judgment appropriate.   EKG: Sinus bradycardia  Darrol Jump, NP 9:49 AM [facility]

## 2022-05-15 LAB — MAGNESIUM: Magnesium: 2 mg/dL (ref 1.5–2.5)

## 2022-05-15 LAB — CBC WITH DIFFERENTIAL/PLATELET
Absolute Monocytes: 511 cells/uL (ref 200–950)
Basophils Absolute: 51 cells/uL (ref 0–200)
Basophils Relative: 1.1 %
Eosinophils Absolute: 32 cells/uL (ref 15–500)
Eosinophils Relative: 0.7 %
HCT: 46.3 % (ref 38.5–50.0)
Hemoglobin: 15.7 g/dL (ref 13.2–17.1)
Lymphs Abs: 1044 cells/uL (ref 850–3900)
MCH: 32.5 pg (ref 27.0–33.0)
MCHC: 33.9 g/dL (ref 32.0–36.0)
MCV: 95.9 fL (ref 80.0–100.0)
MPV: 9.1 fL (ref 7.5–12.5)
Monocytes Relative: 11.1 %
Neutro Abs: 2962 cells/uL (ref 1500–7800)
Neutrophils Relative %: 64.4 %
Platelets: 283 10*3/uL (ref 140–400)
RBC: 4.83 10*6/uL (ref 4.20–5.80)
RDW: 12.3 % (ref 11.0–15.0)
Total Lymphocyte: 22.7 %
WBC: 4.6 10*3/uL (ref 3.8–10.8)

## 2022-05-15 LAB — URINALYSIS, ROUTINE W REFLEX MICROSCOPIC
Bacteria, UA: NONE SEEN /HPF
Bilirubin Urine: NEGATIVE
Glucose, UA: NEGATIVE
Hyaline Cast: NONE SEEN /LPF
Ketones, ur: NEGATIVE
Leukocytes,Ua: NEGATIVE
Nitrite: NEGATIVE
Protein, ur: NEGATIVE
Specific Gravity, Urine: 1.012 (ref 1.001–1.035)
Squamous Epithelial / HPF: NONE SEEN /HPF (ref <=5)
WBC, UA: NONE SEEN /HPF (ref 0–5)
pH: 5.5 (ref 5.0–8.0)

## 2022-05-15 LAB — COMPLETE METABOLIC PANEL WITH GFR
AG Ratio: 1.7 (calc) (ref 1.0–2.5)
ALT: 16 U/L (ref 9–46)
AST: 19 U/L (ref 10–35)
Albumin: 4.2 g/dL (ref 3.6–5.1)
Alkaline phosphatase (APISO): 63 U/L (ref 35–144)
BUN: 19 mg/dL (ref 7–25)
CO2: 27 mmol/L (ref 20–32)
Calcium: 9.2 mg/dL (ref 8.6–10.3)
Chloride: 103 mmol/L (ref 98–110)
Creat: 1.09 mg/dL (ref 0.70–1.35)
Globulin: 2.5 g/dL (calc) (ref 1.9–3.7)
Glucose, Bld: 130 mg/dL — ABNORMAL HIGH (ref 65–99)
Potassium: 4.5 mmol/L (ref 3.5–5.3)
Sodium: 137 mmol/L (ref 135–146)
Total Bilirubin: 0.4 mg/dL (ref 0.2–1.2)
Total Protein: 6.7 g/dL (ref 6.1–8.1)
eGFR: 77 mL/min/{1.73_m2} (ref >=60)

## 2022-05-15 LAB — VITAMIN D 25 HYDROXY (VIT D DEFICIENCY, FRACTURES): Vit D, 25-Hydroxy: 34 ng/mL (ref 30–100)

## 2022-05-15 LAB — URIC ACID: Uric Acid, Serum: 4.6 mg/dL (ref 4.0–8.0)

## 2022-05-15 LAB — LIPID PANEL
Cholesterol: 222 mg/dL — ABNORMAL HIGH (ref <200)
HDL: 51 mg/dL (ref >=40)
LDL Cholesterol (Calc): 147 mg/dL (calc) — ABNORMAL HIGH
Non-HDL Cholesterol (Calc): 171 mg/dL (calc) — ABNORMAL HIGH (ref <130)
Total CHOL/HDL Ratio: 4.4 (calc) (ref <5.0)
Triglycerides: 118 mg/dL (ref <150)

## 2022-05-15 LAB — HEMOGLOBIN A1C
Hgb A1c MFr Bld: 8.1 % of total Hgb — ABNORMAL HIGH (ref <5.7)
Mean Plasma Glucose: 186 mg/dL
eAG (mmol/L): 10.3 mmol/L

## 2022-05-15 LAB — TESTOSTERONE: Testosterone: 570 ng/dL (ref 250–827)

## 2022-05-15 LAB — TSH: TSH: 11.16 mIU/L — ABNORMAL HIGH (ref 0.40–4.50)

## 2022-05-15 LAB — PSA: PSA: 0.23 ng/mL (ref <=4.00)

## 2022-06-07 ENCOUNTER — Other Ambulatory Visit: Payer: Self-pay

## 2022-06-07 MED ORDER — LEVOTHYROXINE SODIUM 150 MCG PO TABS: ORAL_TABLET | ORAL | 3 refills | Status: DC

## 2022-06-09 ENCOUNTER — Other Ambulatory Visit: Payer: Self-pay | Admitting: Internal Medicine

## 2022-06-09 DIAGNOSIS — E039 Hypothyroidism, unspecified: Secondary | ICD-10-CM

## 2022-06-09 MED ORDER — LEVOTHYROXINE SODIUM 150 MCG PO TABS: ORAL_TABLET | ORAL | 3 refills | Status: DC

## 2022-11-09 ENCOUNTER — Other Ambulatory Visit: Payer: Self-pay

## 2022-11-09 DIAGNOSIS — E11649 Type 2 diabetes mellitus with hypoglycemia without coma: Secondary | ICD-10-CM

## 2022-11-09 MED ORDER — FREESTYLE LIBRE 14 DAY SENSOR MISC: 3 refills | Status: AC

## 2022-12-12 ENCOUNTER — Other Ambulatory Visit: Payer: Self-pay | Admitting: Nurse Practitioner

## 2022-12-12 DIAGNOSIS — E1122 Type 2 diabetes mellitus with diabetic chronic kidney disease: Secondary | ICD-10-CM

## 2022-12-12 DIAGNOSIS — I10 Essential (primary) hypertension: Secondary | ICD-10-CM

## 2022-12-13 ENCOUNTER — Other Ambulatory Visit: Payer: Self-pay

## 2022-12-13 MED ORDER — EZETIMIBE 10 MG PO TABS: ORAL_TABLET | ORAL | 3 refills | Status: AC

## 2023-01-03 ENCOUNTER — Other Ambulatory Visit: Payer: Self-pay | Admitting: Nurse Practitioner

## 2023-01-03 DIAGNOSIS — I10 Essential (primary) hypertension: Secondary | ICD-10-CM

## 2023-01-03 DIAGNOSIS — E291 Testicular hypofunction: Secondary | ICD-10-CM

## 2023-01-03 MED ORDER — TESTOSTERONE CYPIONATE 200 MG/ML IM SOLN
INTRAMUSCULAR | 0 refills | Status: DC
Start: 2023-01-03 — End: 2023-08-17

## 2023-01-03 MED ORDER — OLMESARTAN MEDOXOMIL 20 MG PO TABS
20.0000 mg | ORAL_TABLET | Freq: Every day | ORAL | 3 refills | Status: DC
Start: 2023-01-03 — End: 2023-08-17

## 2023-03-08 ENCOUNTER — Other Ambulatory Visit: Payer: Self-pay | Admitting: Nurse Practitioner

## 2023-05-16 ENCOUNTER — Ambulatory Visit (INDEPENDENT_AMBULATORY_CARE_PROVIDER_SITE_OTHER): Payer: Managed Care, Other (non HMO) | Admitting: Nurse Practitioner

## 2023-05-16 ENCOUNTER — Encounter: Payer: Self-pay | Admitting: Nurse Practitioner

## 2023-05-16 VITALS — BP 140/98 | HR 77 | Temp 97.9°F | Resp 16 | Ht 77.0 in | Wt 248.2 lb

## 2023-05-16 DIAGNOSIS — Z131 Encounter for screening for diabetes mellitus: Secondary | ICD-10-CM

## 2023-05-16 DIAGNOSIS — I10 Essential (primary) hypertension: Secondary | ICD-10-CM | POA: Diagnosis not present

## 2023-05-16 DIAGNOSIS — Z1389 Encounter for screening for other disorder: Secondary | ICD-10-CM

## 2023-05-16 DIAGNOSIS — Z130 Encounter for screening for diseases of the blood and blood-forming organs and certain disorders involving the immune mechanism: Secondary | ICD-10-CM

## 2023-05-16 DIAGNOSIS — N401 Enlarged prostate with lower urinary tract symptoms: Secondary | ICD-10-CM

## 2023-05-16 DIAGNOSIS — R350 Frequency of micturition: Secondary | ICD-10-CM | POA: Diagnosis not present

## 2023-05-16 DIAGNOSIS — E559 Vitamin D deficiency, unspecified: Secondary | ICD-10-CM | POA: Diagnosis not present

## 2023-05-16 DIAGNOSIS — Z125 Encounter for screening for malignant neoplasm of prostate: Secondary | ICD-10-CM | POA: Diagnosis not present

## 2023-05-16 DIAGNOSIS — Z136 Encounter for screening for cardiovascular disorders: Secondary | ICD-10-CM

## 2023-05-16 DIAGNOSIS — G4733 Obstructive sleep apnea (adult) (pediatric): Secondary | ICD-10-CM

## 2023-05-16 DIAGNOSIS — R200 Anesthesia of skin: Secondary | ICD-10-CM

## 2023-05-16 DIAGNOSIS — M109 Gout, unspecified: Secondary | ICD-10-CM

## 2023-05-16 DIAGNOSIS — Z0001 Encounter for general adult medical examination with abnormal findings: Secondary | ICD-10-CM

## 2023-05-16 DIAGNOSIS — L601 Onycholysis: Secondary | ICD-10-CM

## 2023-05-16 DIAGNOSIS — E139 Other specified diabetes mellitus without complications: Secondary | ICD-10-CM

## 2023-05-16 DIAGNOSIS — E1169 Type 2 diabetes mellitus with other specified complication: Secondary | ICD-10-CM

## 2023-05-16 DIAGNOSIS — Z0000 Encounter for general adult medical examination without abnormal findings: Secondary | ICD-10-CM | POA: Diagnosis not present

## 2023-05-16 DIAGNOSIS — E782 Mixed hyperlipidemia: Secondary | ICD-10-CM

## 2023-05-16 DIAGNOSIS — I700 Atherosclerosis of aorta: Secondary | ICD-10-CM

## 2023-05-16 DIAGNOSIS — Z79899 Other long term (current) drug therapy: Secondary | ICD-10-CM

## 2023-05-16 DIAGNOSIS — Z789 Other specified health status: Secondary | ICD-10-CM

## 2023-05-16 DIAGNOSIS — E1069 Type 1 diabetes mellitus with other specified complication: Secondary | ICD-10-CM

## 2023-05-16 DIAGNOSIS — Z87442 Personal history of urinary calculi: Secondary | ICD-10-CM

## 2023-05-16 DIAGNOSIS — E538 Deficiency of other specified B group vitamins: Secondary | ICD-10-CM

## 2023-05-16 DIAGNOSIS — Z13220 Encounter for screening for lipoid disorders: Secondary | ICD-10-CM

## 2023-05-16 DIAGNOSIS — N281 Cyst of kidney, acquired: Secondary | ICD-10-CM

## 2023-05-16 DIAGNOSIS — E039 Hypothyroidism, unspecified: Secondary | ICD-10-CM

## 2023-05-16 DIAGNOSIS — M1A9XX0 Chronic gout, unspecified, without tophus (tophi): Secondary | ICD-10-CM

## 2023-05-16 MED ORDER — TERBINAFINE HCL 250 MG PO TABS
250.0000 mg | ORAL_TABLET | Freq: Every day | ORAL | 0 refills | Status: DC
Start: 2023-05-16 — End: 2023-08-17

## 2023-05-16 NOTE — Patient Instructions (Signed)
Preventing Toenail Fungus from Recurring  Sanitize your shoes with Mycomist spray or a similar shoe sanitizer spray.  Follow the instructions on the bottle and dry them outside in the sun or with a hairdryer.  We also recommend repeating the sanitization once weekly in shoes you wear most often.  Throw away any shoes you have worn a significant amount without socks-fungus thrives in a warm moist environment and you want to avoid re-infection after your laser procedure  Bleach your socks with regular or color safe bleach  Change your socks regularly to keep your feet clean and dry (especially if you have sweaty feet)-if sweaty feet are a problem, let your doctor know-there is a great lotion that helps with this problem.  Clean your toenail clippers with alcohol before you use them if you do your own toenails and make sure to replace [place] boards and orange sticks regularly  If you get regular pedicures, bring your own instruments or go to a spa that sterilizes their instruments in an autoclave.  Terbinafine Tablets What is this medication? TERBINAFINE (TER bin a feen) treats fungal infections of the nails. It belongs to a group of medications called antifungals. It will not treat infections caused by bacteria or viruses. This medicine may be used for other purposes; ask your health care provider or pharmacist if you have questions. COMMON BRAND NAME(S): Lamisil, Terbinex What should I tell my care team before I take this medication? They need to know if you have any of these conditions: Liver disease An unusual or allergic reaction to terbinafine, other medications, foods, dyes, or preservatives Pregnant or trying to get pregnant Breast-feeding How should I use this medication? Take this medication by mouth with water. Take it as directed on the prescription label at the same time every day. You can take it with or without food. If it upsets your stomach, take it with food. Keep taking it  unless your care team tells you to stop. A special MedGuide will be given to you by the pharmacist with each prescription and refill. Be sure to read this information carefully each time. Talk to your care team regarding the use of this medication in children. Special care may be needed. Overdosage: If you think you have taken too much of this medicine contact a poison control center or emergency room at once. NOTE: This medicine is only for you. Do not share this medicine with others. What if I miss a dose? If you miss a dose, take it as soon as you can unless it is more than 4 hours late. If it is more than 4 hours late, skip the missed dose. Take the next dose at the normal time. What may interact with this medication? Do not take this medication with any of the following: Pimozide Thioridazine This medication may also interact with the following: Beta blockers Caffeine Certain medications for mental health conditions Cimetidine Cyclosporine Medications for fungal infections like fluconazole and ketoconazole Medications for irregular heartbeat like amiodarone, flecainide and propafenone Rifampin Warfarin This list may not describe all possible interactions. Give your health care provider a list of all the medicines, herbs, non-prescription drugs, or dietary supplements you use. Also tell them if you smoke, drink alcohol, or use illegal drugs. Some items may interact with your medicine. What should I watch for while using this medication? Visit your care team for regular checks on your progress. You may need blood work while you are taking this medication. It may be some time  before you see the benefit from this medication. This medication may cause serious skin reactions. They can happen weeks to months after starting the medication. Contact your care team right away if you notice fevers or flu-like symptoms with a rash. The rash may be red or purple and then turn into blisters or peeling  of the skin. Or, you might notice a red rash with swelling of the face, lips or lymph nodes in your neck or under your arms. This medication can make you more sensitive to the sun. Keep out of the sun, If you cannot avoid being in the sun, wear protective clothing and sunscreen. Do not use sun lamps or tanning beds/booths. What side effects may I notice from receiving this medication? Side effects that you should report to your care team as soon as possible: Allergic reactions--skin rash, itching, hives, swelling of the face, lips, tongue, or throat Change in sense of smell Change in taste Infection--fever, chills, cough, or sore throat Liver injury--right upper belly pain, loss of appetite, nausea, light-colored stool, dark yellow or brown urine, yellowing skin or eyes, unusual weakness or fatigue Low red blood cell level--unusual weakness or fatigue, dizziness, headache, trouble breathing Lupus-like syndrome--joint pain, swelling, or stiffness, butterfly-shaped rash on the face, rashes that get worse in the sun, fever, unusual weakness or fatigue Rash, fever, and swollen lymph nodes Redness, blistering, peeling, or loosening of the skin, including inside the mouth Unusual bruising or bleeding Worsening mood, feelings of depression Side effects that usually do not require medical attention (report to your care team if they continue or are bothersome): Diarrhea Gas Headache Nausea Stomach pain Upset stomach This list may not describe all possible side effects. Call your doctor for medical advice about side effects. You may report side effects to FDA at 1-800-FDA-1088. Where should I keep my medication? Keep out of the reach of children and pets. Store between 20 and 25 degrees C (68 and 77 degrees F). Protect from light. Get rid of any unused medication after the expiration date. To get rid of medications that are no longer needed or have expired: Take the medication to a medication  take-back program. Check with your pharmacy or law enforcement to find a location. If you cannot return the medication, check the label or package insert to see if the medication should be thrown out in the garbage or flushed down the toilet. If you are not sure, ask your care team. If it is safe to put it in the trash, take the medication out of the container. Mix the medication with cat litter, dirt, coffee grounds, or other unwanted substance. Seal the mixture in a bag or container. Put it in the trash. NOTE: This sheet is a summary. It may not cover all possible information. If you have questions about this medicine, talk to your doctor, pharmacist, or health care provider.  2024 Elsevier/Gold Standard (2021-04-01 00:00:00)

## 2023-05-16 NOTE — Progress Notes (Signed)
Complete Physical  Assessment and Plan:  Encounter for Annual Physical Exam with abnormal findings Due annually  Health Maintenance reviewed Healthy lifestyle reviewed and goals set  Atherosclerosis of aorta (HCC) - per CXR 02/2020/Statin intolerance Continue Zetia Discussed adding fiber supplement versus fenofibrate for tighter control. Stain intolerance. Discussed lifestyle modifications. Recommended diet heavy in fruits and veggies, omega 3's. Decrease consumption of animal meats, cheeses, and dairy products. Remain active and exercise as tolerated. Continue to monitor. 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 Discussed general issues about diabetes pathophysiology and management., Educational material distributed., Suggested low cholesterol diet., Encouraged aerobic exercise., Discussed foot care., Reminded to get yearly retinal exam - completed 2023.  Erectile dysfunction associated with type 2 diabetes mellitus (HCC) Continue supplement 200 mg q 2 weeks. Continue zinc. Monitor levels.   Hypothyroidism, s/p thyroidectomy in 1988, hx of thyroid cancer Elevated 04/2022 d/t being without medication. Continue Levothyroxine  Reminded to take on an empty stomach 30-69mins before food.  Stop any Biotin Supplement 48-72 hours before next TSH  level to reduce the risk of falsely low TSH levels. Continue to monitor.    Essential hypertension Continue olmesartan 20 mg every day If BP >130/80 on average discussed increasing medication. Discussed DASH (Dietary Approaches to Stop Hypertension) DASH diet is lower in sodium than a typical American diet. Cut back on foods that are high in saturated fat, cholesterol, and trans fats. Eat more whole-grain foods, fish, poultry, and nuts Remain active and exercise as tolerated daily.  Monitor BP at home-Call if greater than 130/80.  Check and monitor CMP/CBC  OSA on CPAP Sleep apnea- continue CPAP, CPAP is helping with daytime fatigue, weight loss still advised.   Medication management All medications discussed and reviewed in full. All questions and concerns regarding medications addressed.    Vitamin D deficiency Continue supplement Monitor levels  Testosterone deficiency Continued perceived benefits with supplementation, no SE. Check level, recommended zinc supplement, no symptoms of elevated estrogen or dihydrotestosterone.   NEPHROLITHIASIS, HX OF Follow with Urology PRN Stay well hydrated. Remain active and exercise as tolerated daily. Maintain weight.   RENAL CYST Simple cyst per [place]  Gout No longer allopurinol Low purine diet discussed Monitor Uric Acid levels  Screening PSA (prostate specific antigen) Following with Urology Check and monitor PSA levels  Screening for protein uria or hematuria Check and monitor UA  Screening for ischemic heart disease Check and monitor EKG  B12 deficiency Monitor levels  Toenail fungus Start Terbinafine Keep feet free of moisture.  Orders Placed This Encounter  Procedures   CBC with Differential/Platelet   COMPLETE METABOLIC PANEL WITH GFR   Lipid panel   TSH   Hemoglobin A1c   VITAMIN D 25 Hydroxy (Vit-D Deficiency, Fractures)   Urinalysis, Routine w reflex microscopic   Microalbumin / creatinine urine ratio    Vitamin B12   PSA   Uric acid   EKG 12-Lead   Meds  ordered this encounter  Medications   terbinafine (LAMISIL) 250 MG tablet    Sig: Take 1 tablet (250 mg total) by mouth daily. Take 1 pill daily for one month, off for 1 month, one a month, etc until gone    Dispense:  90 tablet    Refill:  0    Order Specific Question:   Supervising Provider    Answer:   Lucky Cowboy [phone]   Notify office for further evaluation and treatment, questions or concerns if any reported s/s fail to improve.   The patient was advised to call back or seek an in-person evaluation if any symptoms worsen or if the condition fails to improve as anticipated.   Further disposition pending results of labs. Discussed med's effects and SE's.    I discussed the assessment and treatment plan with the patient. The patient was provided an opportunity to ask questions and all were answered. The patient agreed with the plan and demonstrated an understanding of the instructions.  Discussed med's effects and SE's. Screening labs and tests as requested with regular follow-up as recommended.  I provided 30 minutes of face-to-face time during this encounter including counseling, chart review, and critical decision making was preformed.  Future Appointments  Date Time Provider Department Center  05/15/2024  9:00 AM Adela Glimpse, NP [facility] None    HPI 62 y.o. male patient presents for a complete physical.  He has Hypothyroidism; Erectile dysfunction associated with type 2 diabetes mellitus (HCC); RENAL CYST; OSA on CPAP; NEPHROLITHIASIS, HX OF; Testosterone deficiency; Mixed diabetic hyperlipidemia associated with type 1 diabetes mellitus (HCC); Hypertension; Medication management; Vitamin D deficiency; LADA (latent autoimmune diabetes in adults), managed as type 1 (HCC); Aortic atherosclerosis (HCC) by CXR 03/11/2020; Overweight (BMI 25.0-29.9); and Chronic gout without tophus on their problem list.  He is married,  1 bio child and 2 step kids, 7 grandchildren.  He works as Sports administrator.   Reports left toenail fungus to great toe x2-3 months.  Has not treated.  Active camper possible susceptible to standing water/shower.    He has OSA on CPAP, endorses 100% compliance and no concerns.   BMI is Body mass index is 29.43 kg/m., he has been working on diet though admits not enough exercise. Wt Readings from Last 3 Encounters:  05/16/23 248 lb 3.2 oz (112.6 kg)  05/14/22 239 lb (108.4 kg)  03/04/22 244 lb 3.2 oz (110.8 kg)   His blood pressure has been controlled at home, today their BP is BP: (!) 140/98 He does not workout but is active out side. He denies chest pain, shortness of breath, dizziness.  Checks BP in home and averge has increased to 135-140/85-90.    Former cigar smoker Q in 2014- VERY RARE USE. He has aortic atherosclerosis per CXR 02/2020.   He has been working on diet and exercise for diabetes due to LADA  metformin and insulin humalog 75/25 mix, 15 units AM, 15 units PM Has had issues with control, labile, recently was able to get CGM covered,  Denies foot ulcerations, hyperglycemia, increased appetite, nausea, paresthesia of the feet, polydipsia, polyuria, visual disturbances, vomiting and weight loss.  Lab Results  Component Value Date   HGBA1C 8.1 (H) 05/14/2022   Had Renal US 2011 showing simple R cyst and non-obstructing renal calculi. He had remoted by cysto/lithotripsy by Dr. Patsi Sears. Last GFR: Lab Results  Component Value Date   GFRNONAA 87 11/07/2020   LDL is not at goal <70, he reports had  aching with rosuvastatin 20 mg and stopped. Willing  Lab Results  Component Value Date   CHOL 222 (H) 05/14/2022   HDL 51 05/14/2022   LDLCALC 147 (H) 05/14/2022   LDLDIRECT 127.3 10/01/2009   TRIG 118 05/14/2022   CHOLHDL 4.4 05/14/2022   He is on thyroid medication s/p thyroidectomy following thyroid cancer in 80s.  His medication was not changed last visit but  TSH was elevated d/t being without medications. Lab Results  Component Value Date   TSH 11.16 (H) 05/14/2022  .  He has a history of testosterone deficiency and is on testosterone replacement. Taking 200 mg every 2 weeks. He states that the testosterone helps with his energy, libido, muscle mass. Denies symptoms of elevated estrogen or dihydrotestosterone.  Lab Results  Component Value Date   TESTOSTERONE 570 05/14/2022    Patient is not taking Vitamin D supplement.   Lab Results  Component Value Date   VD25OH 34 05/14/2022     Last PSA was: Lab Results  Component Value Date   PSA 0.23 05/14/2022   Patient was on allopurinol 100 mg daily for gout and does not report a recent flare. He questions this diagnosis would like to try getting off med. Reports has never had flare. No longer taking. Lab Results  Component Value Date   LABURIC 4.6 05/14/2022     Current Medications:   Current Outpatient Medications (Endocrine & Metabolic):    Insulin Lispro Prot & Lispro (HUMALOG MIX 75/25 KWIKPEN) (75-25) 100 UNIT/ML Kwikpen, INJECT SUBCUTANEOUSLY 30 UNITS  WITH FIRST MEAL OF THE DAY AND  30 UNITS WITH LARGEST MEAL OF  THE DAY PRIOR TO BEDTIME   LANTUS SOLOSTAR 100 UNIT/ML Solostar Pen, INJECT SUBCUTANEOUSLY 20 UNITS  AT BEDTIME   levothyroxine (SYNTHROID) 150 MCG tablet, Take 1 tablet daily on an empty stomach with only water for 30 minutes & no Antacid meds, Calcium or Magnesium for 4 hours & avoid Biotin.                                                                    /                                  TAKE                                    BY                                              MOUTH   metFORMIN (GLUCOPHAGE-XR) 500 MG 24 hr tablet, TAKE 1 TABLET WITH  BREAKFAST AND LUNCH, AND 2  TABLETS WITH SUPPER FOR  DIABETES (Patient taking differently: Take 1 tablet in the evening)   testosterone cypionate (DEPOTESTOSTERONE CYPIONATE) 200 MG/ML injection, INJECT INTRAMUSCULARLY EVERY  14 DAYS AS DIRECTED (DISCARD  UNUSED PORTION AFTER FIRST USE)   dexamethasone (DECADRON) 4 MG tablet, Take 1 tab 3 x /day for 2 days,  then 2 x /day for 2  Days,     then 1 tab daily (Patient not taking: Reported on 05/16/2023)  Current Outpatient Medications (Cardiovascular):    ezetimibe (ZETIA) 10 MG tablet, Take  1 tablet  Daily  for Cholesterol   olmesartan (BENICAR) 20 MG tablet, Take 1 tablet (20 mg total) by mouth daily.  Current Outpatient Medications (Respiratory):    pseudoephedrine (SUDAFED) 120 MG 12 hr tablet, Take  1 tablet  2 x /day (every 12 hours)  for Congestion & Ear Fluid (Patient not taking: Reported on 05/16/2023)  Current Outpatient Medications (Analgesics):    allopurinol (ZYLOPRIM) 100 MG tablet, TAKE 1 TABLET BY MOUTH  DAILY TO PREVENT GOUT   Current Outpatient Medications (Other):    BD PEN NEEDLE NANO U/F 32G X 4 MM MISC, USE TWICE A DAY AS DIRECTED   Cholecalciferol (VITAMIN D PO), Take 4,000 Int'l Units by mouth daily.   CINNAMON PO, Take 1,000 mg by mouth.   Continuous Blood Gluc Sensor (FREESTYLE LIBRE 14 DAY SENSOR) MISC, APPLY NEW SENSOR EVERY 14  DAYS TO CHECK BLOOD SUGAR 4 TIMES DAILY TO MANAGE  INSULIN   Continuous Blood Gluc Sensor MISC, 1 each by Does not apply route as directed. Check blood sugar 3 times a day Dx:E10.9   glucose blood test strip, Check blood sugar 3 times a day-DX-E13.9.   NEEDLE, DISP, 21 G (BD ECLIPSE NEEDLE) 21G X 1" MISC, Use to inject 2 mL intramuscularly every 14 days.   SYRINGE-NEEDLE, DISP, 3 ML (BD ECLIPSE SYRINGE) 21G X 1" 3 ML MISC, Use to inject 2 mL intramuscularly every 14 days   terbinafine (LAMISIL) 250 MG tablet, Take 1 tablet (250 mg total) by mouth daily. Take 1 pill daily for one month, off for 1 month, one a month, etc until gone   cyclobenzaprine (FLEXERIL) 10 MG tablet, Take 1/2 to 1 tablet 3 x /day as needed for Muscle Spasm (Patient not taking: Reported on 05/16/2023)   pantoprazole (PROTONIX) 40 MG tablet,  Take 1 tablet (40 mg total) by mouth daily. (Patient not taking: Reported on 05/14/2022)  Allergies:  Allergies  Allergen Reactions   Shellfish Allergy Anaphylaxis and Rash   Atorvastatin Other (See Comments)    Myalgias   Health Maintenance:  Immunization History  Administered Date(s) Administered   Influenza Inj Mdck Quad With Preservative 07/07/2020   Influenza Split 06/12/2013   Influenza Whole 06/30/2009   Influenza,inj,Quad PF,6+ Mos 06/22/2021   Influenza-Unspecified 06/25/2017, 06/26/2019   Moderna Sars-Covid-2 Vaccination 02/09/2020, 03/19/2020   PPD Test 10/29/2013, 10/30/2014, 11/12/2015   Td 01/08/2004   Tdap 10/29/2013   Tetanus: 2015 Influenza: Due 05/2022 Pneumonia: declines this visit  Shingrix: check with insurance Covid 19: 2/2, moderna, 2021 -   Colonoscopy: 2017 Dr. Kinnie Scales repeat 10 years  Last eye exam: eye provider at [facility], within the last year, 2023 Last dental: last 2024, goes q94m  Patient Care Team: Lucky Cowboy, MD as PCP - General (Internal Medicine)  Medical History:  has Hypothyroidism; Erectile dysfunction associated with type 2 diabetes mellitus (HCC); RENAL CYST; OSA on CPAP; NEPHROLITHIASIS, HX OF; Testosterone deficiency; Mixed diabetic hyperlipidemia associated with type 1 diabetes mellitus (HCC); Hypertension; Medication management; Vitamin D deficiency; LADA (latent autoimmune diabetes in adults), managed as type 1 (HCC); Aortic atherosclerosis (HCC) by CXR 03/11/2020; Overweight (BMI 25.0-29.9); and Chronic gout without tophus on their problem list. Surgical History:  He  has a past surgical history that includes Thyroidectomy (2001); Cystoscopy/retrograde/ureteroscopy/stone extraction  with basket (Right, 01/19/2016); Cystoscopy with holmium laser lithotripsy (Right, 01/19/2016); and Colonoscopy. Family History:  His family history includes Diabetes in his father; Diabetes (age of onset: 4) in his sister; Hypertension in his brother  and father; Prostate cancer in his maternal grandfather and paternal grandfather; Thyroid cancer in his paternal grandmother. Social History:   reports that he quit smoking about 9 years ago. His smoking use included cigars. He has never used smokeless tobacco. He reports current alcohol use of about 7.0 standard drinks of alcohol per week. He reports that he does not use drugs.  Review of Systems:  Review of Systems  Constitutional:  Negative for malaise/fatigue and weight loss.  HENT:  Negative for hearing loss and tinnitus.   Eyes:  Negative for blurred vision and double vision.  Respiratory:  Negative for cough, shortness of breath and wheezing.   Cardiovascular:  Negative for chest pain, palpitations, orthopnea, claudication and leg swelling.  Gastrointestinal:  Negative for abdominal pain, blood in stool, constipation, diarrhea, heartburn, melena, nausea and vomiting.  Genitourinary: Negative.   Musculoskeletal:  Negative for joint pain and myalgias.  Skin:  Negative for rash.  Neurological:  Negative for dizziness, tingling, sensory change, weakness and headaches.  Endo/Heme/Allergies:  Negative for polydipsia.  Psychiatric/Behavioral: Negative.    All other systems reviewed and are negative.   Physical Exam: Estimated body mass index is 29.43 kg/m as calculated from the following:   Height as of this encounter: 6\' 5"  (1.956 m).   Weight as of this encounter: 248 lb 3.2 oz (112.6 kg). BP (!) 140/98   Pulse 77   Temp 97.9 F (36.6 C)   Resp 16   Ht 6\' 5"  (1.956 m)   Wt 248 lb 3.2 oz (112.6 kg)   SpO2 98%   BMI 29.43 kg/m  General Appearance: Well nourished, in no apparent distress.  Eyes: PERRLA, EOMs, conjunctiva no swelling or erythema, normal fundi and vessels.  Sinuses: No Frontal/maxillary tenderness  ENT/Mouth: Ext aud canals clear, normal light reflex with TMs without erythema, bulging. Good dentition. No erythema, swelling, or exudate on post pharynx. Tonsils not  swollen or erythematous. Hearing normal.  Neck: Supple, thyroid normal. No bruits  Respiratory: Respiratory effort normal, BS equal bilaterally without rales, rhonchi, wheezing or stridor.  Cardio: RRR without murmurs, rubs or gallops. Brisk peripheral pulses without edema.  Chest: symmetric, with normal excursions and percussion.  Abdomen: Soft, nontender, no guarding, rebound, hernias, masses, or organomegaly.  Lymphatics: Non tender without lymphadenopathy.  Genitourinary: denies concerns Musculoskeletal: Full ROM all peripheral extremities,5/5 strength, and normal gait. + varicose veins bilateral legs, no erythema, hard cord, neg homen's Skin: Left great toe with brown discoloration to toenail. Warm, dry without rashes, lesions, ecchymosis. Neuro: Cranial nerves intact, reflexes equal bilaterally. Normal muscle tone, no cerebellar symptoms. Sensation intact bil feet to monofilament.  Psych: Awake and oriented X 3, normal affect, Insight and Judgment appropriate.   Diabetic foot exam:  Left: Reflexes 2+   Vibratory sensation normal  Proprioception normal  Sharp/dull discrimination normal  Filament test present Right: Reflexes 2+   Vibratory sensation normal  Proprioception normal  Sharp/dull discrimination normal  Filament test present   EKG: NSR  Adela Glimpse, NP 9:52 AM [facility]

## 2023-05-17 LAB — CBC WITH DIFFERENTIAL/PLATELET
Absolute Monocytes: 559 {cells}/uL (ref 200–950)
Basophils Absolute: 91 {cells}/uL (ref 0–200)
Basophils Relative: 1.6 %
Eosinophils Absolute: 51 {cells}/uL (ref 15–500)
Eosinophils Relative: 0.9 %
HCT: 50.7 % — ABNORMAL HIGH (ref 38.5–50.0)
Hemoglobin: 17.2 g/dL — ABNORMAL HIGH (ref 13.2–17.1)
Lymphs Abs: 1112 {cells}/uL (ref 850–3900)
MCH: 33 pg (ref 27.0–33.0)
MCHC: 33.9 g/dL (ref 32.0–36.0)
MCV: 97.3 fL (ref 80.0–100.0)
MPV: 9.6 fL (ref 7.5–12.5)
Monocytes Relative: 9.8 %
Neutro Abs: 3887 {cells}/uL (ref 1500–7800)
Neutrophils Relative %: 68.2 %
Platelets: 266 10*3/uL (ref 140–400)
RBC: 5.21 10*6/uL (ref 4.20–5.80)
RDW: 12.4 % (ref 11.0–15.0)
Total Lymphocyte: 19.5 %
WBC: 5.7 10*3/uL (ref 3.8–10.8)

## 2023-05-17 LAB — HEMOGLOBIN A1C
Hgb A1c MFr Bld: 8.2 %{Hb} — ABNORMAL HIGH (ref <5.7)
Mean Plasma Glucose: 189 mg/dL
eAG (mmol/L): 10.4 mmol/L

## 2023-05-17 LAB — COMPLETE METABOLIC PANEL WITH GFR
AG Ratio: 1.8 (calc) (ref 1.0–2.5)
ALT: 16 U/L (ref 9–46)
AST: 23 U/L (ref 10–35)
Albumin: 4.7 g/dL (ref 3.6–5.1)
Alkaline phosphatase (APISO): 55 U/L (ref 35–144)
BUN: 20 mg/dL (ref 7–25)
CO2: 28 mmol/L (ref 20–32)
Calcium: 9.4 mg/dL (ref 8.6–10.3)
Chloride: 103 mmol/L (ref 98–110)
Creat: 1.33 mg/dL (ref 0.70–1.35)
Globulin: 2.6 g/dL (ref 1.9–3.7)
Glucose, Bld: 139 mg/dL — ABNORMAL HIGH (ref 65–99)
Potassium: 4.9 mmol/L (ref 3.5–5.3)
Sodium: 138 mmol/L (ref 135–146)
Total Bilirubin: 0.6 mg/dL (ref 0.2–1.2)
Total Protein: 7.3 g/dL (ref 6.1–8.1)
eGFR: 60 mL/min/{1.73_m2} (ref >=60)

## 2023-05-17 LAB — LIPID PANEL
Cholesterol: 218 mg/dL — ABNORMAL HIGH (ref <200)
HDL: 56 mg/dL (ref >=40)
LDL Cholesterol (Calc): 143 mg/dL — ABNORMAL HIGH
Non-HDL Cholesterol (Calc): 162 mg/dL — ABNORMAL HIGH (ref <130)
Total CHOL/HDL Ratio: 3.9 (calc) (ref <5.0)
Triglycerides: 87 mg/dL (ref <150)

## 2023-05-17 LAB — MICROALBUMIN / CREATININE URINE RATIO
Creatinine, Urine: 201 mg/dL (ref 20–320)
Microalb Creat Ratio: 5 mg/g{creat} (ref <30)
Microalb, Ur: 1.1 mg/dL

## 2023-05-17 LAB — URINALYSIS, ROUTINE W REFLEX MICROSCOPIC
Bilirubin Urine: NEGATIVE
Glucose, UA: NEGATIVE
Hgb urine dipstick: NEGATIVE
Ketones, ur: NEGATIVE
Leukocytes,Ua: NEGATIVE
Nitrite: NEGATIVE
Protein, ur: NEGATIVE
Specific Gravity, Urine: 1.022 (ref 1.001–1.035)
pH: 6.5 (ref 5.0–8.0)

## 2023-05-17 LAB — TSH: TSH: 75.41 m[IU]/L — ABNORMAL HIGH (ref 0.40–4.50)

## 2023-05-17 LAB — URIC ACID: Uric Acid, Serum: 4.2 mg/dL (ref 4.0–8.0)

## 2023-05-17 LAB — PSA: PSA: 0.21 ng/mL (ref <=4.00)

## 2023-05-17 LAB — VITAMIN D 25 HYDROXY (VIT D DEFICIENCY, FRACTURES): Vit D, 25-Hydroxy: 32 ng/mL (ref 30–100)

## 2023-05-17 LAB — VITAMIN B12: Vitamin B-12: 449 pg/mL (ref 200–1100)

## 2023-06-24 ENCOUNTER — Other Ambulatory Visit: Payer: Self-pay | Admitting: Nurse Practitioner

## 2023-06-24 ENCOUNTER — Telehealth: Payer: Self-pay | Admitting: Nurse Practitioner

## 2023-06-24 DIAGNOSIS — E119 Type 2 diabetes mellitus without complications: Secondary | ICD-10-CM

## 2023-06-24 MED ORDER — METFORMIN HCL ER 500 MG PO TB24: ORAL_TABLET | ORAL | 3 refills | Status: AC

## 2023-06-24 MED ORDER — ALLOPURINOL 100 MG PO TABS: 100.0000 mg | ORAL_TABLET | Freq: Every day | ORAL | 3 refills | Status: DC

## 2023-06-24 NOTE — Addendum Note (Signed)
Addended by: Dionicio Stall on: 06/24/2023 09:14 AM   Modules accepted: Orders

## 2023-06-24 NOTE — Telephone Encounter (Signed)
Pt is requesting refill on allopurinol and metformin. Pls send to [facility] [address], [place], [place] [address]

## 2023-08-17 ENCOUNTER — Encounter: Payer: Self-pay | Admitting: Nurse Practitioner

## 2023-08-17 ENCOUNTER — Ambulatory Visit (INDEPENDENT_AMBULATORY_CARE_PROVIDER_SITE_OTHER): Payer: Managed Care, Other (non HMO) | Admitting: Nurse Practitioner

## 2023-08-17 VITALS — BP 128/86 | HR 64 | Temp 97.9°F | Ht 77.0 in | Wt 250.0 lb

## 2023-08-17 DIAGNOSIS — R200 Anesthesia of skin: Secondary | ICD-10-CM | POA: Diagnosis not present

## 2023-08-17 DIAGNOSIS — M1A9XX0 Chronic gout, unspecified, without tophus (tophi): Secondary | ICD-10-CM

## 2023-08-17 DIAGNOSIS — E1069 Type 1 diabetes mellitus with other specified complication: Secondary | ICD-10-CM

## 2023-08-17 DIAGNOSIS — I10 Essential (primary) hypertension: Secondary | ICD-10-CM

## 2023-08-17 DIAGNOSIS — N521 Erectile dysfunction due to diseases classified elsewhere: Secondary | ICD-10-CM

## 2023-08-17 DIAGNOSIS — I700 Atherosclerosis of aorta: Secondary | ICD-10-CM

## 2023-08-17 DIAGNOSIS — L601 Onycholysis: Secondary | ICD-10-CM

## 2023-08-17 DIAGNOSIS — E1169 Type 2 diabetes mellitus with other specified complication: Secondary | ICD-10-CM

## 2023-08-17 DIAGNOSIS — E291 Testicular hypofunction: Secondary | ICD-10-CM

## 2023-08-17 DIAGNOSIS — N281 Cyst of kidney, acquired: Secondary | ICD-10-CM

## 2023-08-17 DIAGNOSIS — E782 Mixed hyperlipidemia: Secondary | ICD-10-CM

## 2023-08-17 DIAGNOSIS — R202 Paresthesia of skin: Secondary | ICD-10-CM

## 2023-08-17 DIAGNOSIS — E039 Hypothyroidism, unspecified: Secondary | ICD-10-CM

## 2023-08-17 DIAGNOSIS — G4733 Obstructive sleep apnea (adult) (pediatric): Secondary | ICD-10-CM

## 2023-08-17 DIAGNOSIS — N182 Chronic kidney disease, stage 2 (mild): Secondary | ICD-10-CM

## 2023-08-17 DIAGNOSIS — Z87442 Personal history of urinary calculi: Secondary | ICD-10-CM

## 2023-08-17 DIAGNOSIS — Z794 Long term (current) use of insulin: Secondary | ICD-10-CM

## 2023-08-17 DIAGNOSIS — E349 Endocrine disorder, unspecified: Secondary | ICD-10-CM

## 2023-08-17 DIAGNOSIS — E139 Other specified diabetes mellitus without complications: Secondary | ICD-10-CM

## 2023-08-17 DIAGNOSIS — Z79899 Other long term (current) drug therapy: Secondary | ICD-10-CM

## 2023-08-17 DIAGNOSIS — L608 Other nail disorders: Secondary | ICD-10-CM

## 2023-08-17 MED ORDER — OLMESARTAN MEDOXOMIL 20 MG PO TABS
20.0000 mg | ORAL_TABLET | Freq: Every day | ORAL | 3 refills | Status: AC
Start: 2023-08-17 — End: ?

## 2023-08-17 MED ORDER — LEVOTHYROXINE SODIUM 150 MCG PO TABS
ORAL_TABLET | ORAL | 3 refills | Status: DC
Start: 2023-08-17 — End: 2023-08-25

## 2023-08-17 MED ORDER — SILDENAFIL CITRATE 50 MG PO TABS: 50.0000 mg | ORAL_TABLET | ORAL | 1 refills | Status: AC | PRN

## 2023-08-17 MED ORDER — TERBINAFINE HCL 250 MG PO TABS: 250.0000 mg | ORAL_TABLET | Freq: Every day | ORAL | 0 refills | Status: DC

## 2023-08-17 MED ORDER — ALLOPURINOL 100 MG PO TABS: 100.0000 mg | ORAL_TABLET | Freq: Every day | ORAL | 3 refills | Status: AC

## 2023-08-17 MED ORDER — LANTUS SOLOSTAR 100 UNIT/ML ~~LOC~~ SOPN: PEN_INJECTOR | SUBCUTANEOUS | 3 refills | Status: AC

## 2023-08-17 MED ORDER — TESTOSTERONE CYPIONATE 200 MG/ML IM SOLN: INTRAMUSCULAR | 0 refills | Status: AC

## 2023-08-17 MED ORDER — INSULIN LISPRO PROT & LISPRO (75-25 MIX) 100 UNIT/ML KWIKPEN: PEN_INJECTOR | SUBCUTANEOUS | 3 refills | Status: DC

## 2023-08-17 NOTE — Patient Instructions (Signed)

## 2023-08-17 NOTE — Progress Notes (Signed)
Follow Up  Assessment and Plan:  Atherosclerosis of aorta (HCC) - per CXR 02/2020/Statin intolerance Continue Zetia Discussed adding fiber supplement versus fenofibrate for tighter control. Stain intolerance. Discussed lifestyle modifications. Recommended diet heavy in fruits and veggies, omega 3's. Decrease consumption of animal meats, cheeses, and dairy products. Remain active and exercise as tolerated. Continue to monitor. 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 Discussed general issues about diabetes pathophysiology and management., Educational material distributed., Suggested low cholesterol diet., Encouraged aerobic exercise., Discussed foot care., Reminded to get yearly retinal exam - completed 2023.  Erectile dysfunction associated with type 2 diabetes mellitus (HCC) Continue supplement 200 mg q 2 weeks. Continue zinc. Monitor levels.   Hypothyroidism, s/p thyroidectomy in 1988, hx of thyroid cancer Elevated 04/2022 d/t being without medication. Continue Levothyroxine  Reminded to take on an empty stomach 30-97mins before food.  Stop any Biotin Supplement 48-72 hours before next TSH level to reduce the risk of falsely low TSH levels. Continue to monitor.    Essential hypertension Continue olmesartan 20 mg every day If BP >130/80  on average discussed increasing medication. Discussed DASH (Dietary Approaches to Stop Hypertension) DASH diet is lower in sodium than a typical American diet. Cut back on foods that are high in saturated fat, cholesterol, and trans fats. Eat more whole-grain foods, fish, poultry, and nuts Remain active and exercise as tolerated daily.  Monitor BP at home-Call if greater than 130/80.  Check and monitor CMP/CBC  OSA on CPAP Sleep apnea- continue CPAP, CPAP is helping with daytime fatigue, weight loss still advised.   Medication management All medications discussed and reviewed in full. All questions and concerns regarding medications addressed.    Vitamin D deficiency Continue supplement Monitor levels  Testosterone deficiency Continued perceived benefits with supplementation, no SE. Check level, recommended zinc supplement, no symptoms of elevated estrogen or dihydrotestosterone.   NEPHROLITHIASIS, HX OF Follow with Urology PRN Stay well hydrated. Remain active and exercise as tolerated daily. Maintain weight.   RENAL CYST Simple cyst per [place]  Gout No longer allopurinol Low purine diet discussed Monitor Uric Acid levels  Toenail fungus/Nail discoloration Picture obtained and noted below Continue Terbinafine for further tmt of onychomycosis Will refer back to Dermatology for further review and evaluation of other underlying etiology (subungual melanoma) give reports of hx of skin cancer - due for skin check. Keep feet free of moisture. Continue to monitor  Orders Placed This Encounter  Procedures   CBC with Differential/Platelet   COMPLETE METABOLIC PANEL WITH GFR   Lipid panel   Hemoglobin A1c   TSH   Ambulatory referral to Dermatology    Referral Priority:   Routine    Referral Type:   Consultation    Referral Reason:   Specialty Services Required    Requested Specialty:   Dermatology    Number of Visits Requested:   1   Meds ordered this encounter   Medications  allopurinol (ZYLOPRIM) 100 MG tablet    Sig: Take 1 tablet (100 mg total) by mouth daily.    Dispense:  90 tablet    Refill:  3    Requesting 1 year supply   Insulin Lispro Prot & Lispro (HUMALOG MIX 75/25 KWIKPEN) (75-25) 100 UNIT/ML Kwikpen    Sig: INJECT SUBCUTANEOUSLY 30 UNITS  WITH FIRST MEAL OF THE DAY AND  30 UNITS WITH LARGEST MEAL OF  THE DAY PRIOR TO BEDTIME    Dispense:  30 mL    Refill:  3    Please send a replace/new response with 90-Day Supply if appropriate to maximize member benefit. Requesting 1 year supply.   insulin glargine (LANTUS SOLOSTAR) 100 UNIT/ML Solostar Pen    Sig: INJECT SUBCUTANEOUSLY 20 UNITS  AT BEDTIME    Dispense:  45 mL    Refill:  3    Please send a replace/new response with 90-Day Supply if appropriate to maximize member benefit. Requesting 1 year supply.   levothyroxine (SYNTHROID) 150 MCG tablet    Sig: Take 1 tablet daily on an empty stomach with only water for 30 minutes & no Antacid meds, Calcium or Magnesium for 4 hours & avoid Biotin.    Dispense:  90 tablet    Refill:  3    PLEASE   send refill requests electronically by eScripts   olmesartan (BENICAR) 20 MG tablet    Sig: Take 1 tablet (20 mg total) by mouth daily.    Dispense:  90 tablet    Refill:  3    Please send a replace/new response with 90-Day Supply if appropriate to maximize member benefit. Requesting 1 year supply.   testosterone cypionate (DEPOTESTOSTERONE CYPIONATE) 200 MG/ML injection    Sig: INJECT INTRAMUSCULARLY EVERY 14 DAYS AS DIRECTED (DISCARD  UNUSED PORTION AFTER FIRST USE)    Dispense:  6 mL    Refill:  0   terbinafine (LAMISIL) 250 MG tablet    Sig: Take 1 tablet (250 mg total) by mouth daily. Take 1 pill daily for one month, off for 1 month, one a month, etc until gone    Dispense:  90 tablet    Refill:  0    Supervising Provider:   Lucky Cowboy [[id number]]   sildenafil (VIAGRA) 50 MG tablet    Sig: Take 1 tablet (50 mg total) by mouth as  needed for erectile dysfunction.    Dispense:  20 tablet    Refill:  1    Supervising Provider:   Lucky Cowboy [phone]   Notify office for further evaluation and treatment, questions or concerns if any reported s/s fail to improve.   The patient was advised to call back or seek an in-person evaluation if any symptoms worsen or if the condition fails to improve as anticipated.   Further disposition pending results of labs. Discussed med's effects and SE's.    I discussed the assessment and treatment plan with the patient. The patient was provided an opportunity to ask questions and all were answered. The patient agreed with the plan and demonstrated an understanding of the instructions.  Discussed med's effects and SE's. Screening labs and tests as requested with regular follow-up as recommended.  I provided 30 minutes of face-to-face time during this encounter including counseling, chart review, and critical decision making was preformed.  Future Appointments  Date Time Provider Department Center  05/15/2024  9:00 AM Adela Glimpse, NP [facility] None    HPI 61 y.o. male patient  presents for a follow up.  He has Hypothyroidism; Erectile dysfunction associated with type 2 diabetes mellitus (HCC); RENAL CYST; OSA on CPAP; NEPHROLITHIASIS, HX OF; Testosterone deficiency; Mixed diabetic hyperlipidemia associated with type 1 diabetes mellitus (HCC); Hypertension; Medication management; Vitamin D deficiency; LADA (latent autoimmune diabetes in adults), managed as type 1 (HCC); Aortic atherosclerosis (HCC) by CXR 03/11/2020; Overweight (BMI 25.0-29.9); and Chronic gout without tophus on their problem list.  He is thought to have right toenail fungus to great toe x6 months and was treated with a 30 day supply of Terbinafine in 10/204.  He has not take 07/2023 and plans to start 08/17/23.  He reports having a hx of "skin cancer" does not remember type however, unable to locate in chart and no hx  noted.  He also reports following with a Dermatologist in [place], unable to recall the name, unable to locate in chart.  He tried scheduling an appt for a yearly skin check in 10/2023 but did not follow through with the apt.  The toenail has not yet responded to current treatment.  Denies any known injury to toe.  Of note, patient is an active camper possible susceptible to standing water/shower.    He has OSA on CPAP, endorses 100% compliance and no concerns.   BMI is Body mass index is 29.65 kg/m., he has been working on diet though admits not enough exercise. Wt Readings from Last 3 Encounters:  08/17/23 250 lb (113.4 kg)  05/16/23 248 lb 3.2 oz (112.6 kg)  05/14/22 239 lb (108.4 kg)   His blood pressure has been controlled at home, today their BP is BP: 128/86 He does not workout but is active out side. He denies chest pain, shortness of breath, dizziness.  Checks BP in home and averge has increased to 135-140/85-90.    Former cigar smoker Q in 2014- VERY RARE USE. He has aortic atherosclerosis per CXR 02/2020.   He has been working on diet and exercise for diabetes due to LADA  metformin and insulin humalog 75/25 mix, 15 units AM, 15 units PM Has had issues with control, labile, recently was able to get CGM covered,  Denies foot ulcerations, hyperglycemia, increased appetite, nausea, paresthesia of the feet, polydipsia, polyuria, visual disturbances, vomiting and weight loss.  Lab Results  Component Value Date   HGBA1C 8.2 (H) 05/16/2023   Had Renal US 2011 showing simple R cyst and non-obstructing renal calculi. He had remoted by cysto/lithotripsy by Dr. Patsi Sears. Last GFR: Lab Results  Component Value Date   GFRNONAA 87 11/07/2020   LDL is not at goal <70, he reports had aching with rosuvastatin 20 mg and stopped. Willing  Lab Results  Component Value Date   CHOL 218 (H) 05/16/2023   HDL 56 05/16/2023   LDLCALC 143 (H) 05/16/2023   LDLDIRECT 127.3 10/01/2009   TRIG 87  05/16/2023   CHOLHDL 3.9 05/16/2023   He is on thyroid medication s/p thyroidectomy following thyroid cancer in 80s.  His medication was not changed last visit but TSH was elevated d/t being without medications. Lab Results  Component Value Date   TSH 75.41 (H) 05/16/2023  .  He has a history of testosterone deficiency and is on testosterone replacement. Taking 200 mg every 2 weeks. He states that the testosterone helps with his energy, libido, muscle mass. Denies symptoms of elevated estrogen or dihydrotestosterone.  Lab Results  Component Value Date   TESTOSTERONE 570 05/14/2022    Patient is not taking Vitamin D  supplement.   Lab Results  Component Value Date   VD25OH 32 05/16/2023     Last PSA was: Lab Results  Component Value Date   PSA 0.21 05/16/2023   Patient was on allopurinol 100 mg daily for gout and does not report a recent flare. He questions this diagnosis would like to try getting off med. Reports has never had flare. No longer taking. Lab Results  Component Value Date   LABURIC 4.2 05/16/2023     Current Medications:   Current Outpatient Medications (Endocrine & Metabolic):    metFORMIN (GLUCOPHAGE-XR) 500 MG 24 hr tablet, Take one tablet at dinnertime   dexamethasone (DECADRON) 4 MG tablet, Take 1 tab 3 x /day for 2 days,      then 2 x /day for 2  Days,     then 1 tab daily (Patient not taking: Reported on 08/17/2023)   insulin glargine (LANTUS SOLOSTAR) 100 UNIT/ML Solostar Pen, INJECT SUBCUTANEOUSLY 20 UNITS  AT BEDTIME   Insulin Lispro Prot & Lispro (HUMALOG MIX 75/25 KWIKPEN) (75-25) 100 UNIT/ML Kwikpen, INJECT SUBCUTANEOUSLY 30 UNITS  WITH FIRST MEAL OF THE DAY AND  30 UNITS WITH LARGEST MEAL OF  THE DAY PRIOR TO BEDTIME   levothyroxine (SYNTHROID) 150 MCG tablet, Take 1 tablet daily on an empty stomach with only water for 30 minutes & no Antacid meds, Calcium or Magnesium for 4 hours & avoid Biotin.   testosterone cypionate (DEPOTESTOSTERONE CYPIONATE) 200  MG/ML injection, INJECT INTRAMUSCULARLY EVERY 14 DAYS AS DIRECTED (DISCARD  UNUSED PORTION AFTER FIRST USE)  Current Outpatient Medications (Cardiovascular):    sildenafil (VIAGRA) 50 MG tablet, Take 1 tablet (50 mg total) by mouth as needed for erectile dysfunction.   ezetimibe (ZETIA) 10 MG tablet, Take  1 tablet  Daily  for Cholesterol (Patient not taking: Reported on 08/17/2023)   olmesartan (BENICAR) 20 MG tablet, Take 1 tablet (20 mg total) by mouth daily.  Current Outpatient Medications (Respiratory):    pseudoephedrine (SUDAFED) 120 MG 12 hr tablet, Take  1 tablet  2 x /day (every 12 hours)  for Congestion & Ear Fluid (Patient not taking: Reported on 08/17/2023)  Current Outpatient Medications (Analgesics):    allopurinol (ZYLOPRIM) 100 MG tablet, Take 1 tablet (100 mg total) by mouth daily.   Current Outpatient Medications (Other):    BD PEN NEEDLE NANO U/F 32G X 4 MM MISC, USE TWICE A DAY AS DIRECTED   CINNAMON PO, Take 1,000 mg by mouth.   Continuous Blood Gluc Sensor (FREESTYLE LIBRE 14 DAY SENSOR) MISC, APPLY NEW SENSOR EVERY 14  DAYS TO CHECK BLOOD SUGAR 4 TIMES DAILY TO MANAGE  INSULIN   Continuous Blood Gluc Sensor MISC, 1 each by Does not apply route as directed. Check blood sugar 3 times a day Dx:E10.9   glucose blood test strip, Check blood sugar 3 times a day-DX-E13.9.   NEEDLE, DISP, 21 G (BD ECLIPSE NEEDLE) 21G X 1" MISC, Use to inject 2 mL intramuscularly every 14 days.   SYRINGE-NEEDLE, DISP, 3 ML (BD ECLIPSE SYRINGE) 21G X 1" 3 ML MISC, Use to inject 2 mL intramuscularly every 14 days   Cholecalciferol (VITAMIN D PO), Take 4,000 Int'l Units by mouth daily. (Patient not taking: Reported on 08/17/2023)   cyclobenzaprine (FLEXERIL) 10 MG tablet, Take 1/2 to 1 tablet 3 x /day as needed for Muscle Spasm (Patient not taking: Reported on 08/17/2023)   pantoprazole (PROTONIX) 40 MG tablet, Take 1 tablet (40 mg total) by mouth daily. (  Patient not taking: Reported on  08/17/2023)   terbinafine (LAMISIL) 250 MG tablet, Take 1 tablet (250 mg total) by mouth daily. Take 1 pill daily for one month, off for 1 month, one a month, etc until gone  Allergies:  Allergies  Allergen Reactions   Shellfish Allergy Anaphylaxis and Rash   Atorvastatin Other (See Comments)    Myalgias   Health Maintenance:  Immunization History  Administered Date(s) Administered   Influenza Inj Mdck Quad With Preservative 07/07/2020   Influenza Split 06/12/2013   Influenza Whole 06/30/2009   Influenza,inj,Quad PF,6+ Mos 06/22/2021   Influenza-Unspecified 06/25/2017, 06/26/2019   Moderna Sars-Covid-2 Vaccination 02/09/2020, 03/19/2020   PPD Test 10/29/2013, 10/30/2014, 11/12/2015   Td 01/08/2004   Tdap 10/29/2013    Patient Care Team: Lucky Cowboy, MD as PCP - General (Internal Medicine)  Medical History:  has Hypothyroidism; Erectile dysfunction associated with type 2 diabetes mellitus (HCC); RENAL CYST; OSA on CPAP; NEPHROLITHIASIS, HX OF; Testosterone deficiency; Mixed diabetic hyperlipidemia associated with type 1 diabetes mellitus (HCC); Hypertension; Medication management; Vitamin D deficiency; LADA (latent autoimmune diabetes in adults), managed as type 1 (HCC); Aortic atherosclerosis (HCC) by CXR 03/11/2020; Overweight (BMI 25.0-29.9); and Chronic gout without tophus on their problem list. Surgical History:  He  has a past surgical history that includes Thyroidectomy (2001); Cystoscopy/retrograde/ureteroscopy/stone extraction with basket (Right, 01/19/2016); Cystoscopy with holmium laser lithotripsy (Right, 01/19/2016); and Colonoscopy. Family History:  His family history includes Diabetes in his father; Diabetes (age of onset: 4) in his sister; Hypertension in his brother and father; Prostate cancer in his maternal grandfather and paternal grandfather; Thyroid cancer in his paternal grandmother. Social History:   reports that he quit smoking about 10 years ago. His  smoking use included cigars. He has never used smokeless tobacco. He reports current alcohol use of about 7.0 standard drinks of alcohol per week. He reports that he does not use drugs.  Review of Systems:  Review of Systems  Constitutional:  Negative for malaise/fatigue and weight loss.  HENT:  Negative for hearing loss and tinnitus.   Eyes:  Negative for blurred vision and double vision.  Respiratory:  Negative for cough, shortness of breath and wheezing.   Cardiovascular:  Negative for chest pain, palpitations, orthopnea, claudication and leg swelling.  Gastrointestinal:  Negative for abdominal pain, blood in stool, constipation, diarrhea, heartburn, melena, nausea and vomiting.  Genitourinary: Negative.   Musculoskeletal:  Negative for joint pain and myalgias.  Skin:  Negative for rash.  Neurological:  Negative for dizziness, tingling, sensory change, weakness and headaches.  Endo/Heme/Allergies:  Negative for polydipsia.  Psychiatric/Behavioral: Negative.    All other systems reviewed and are negative.   Physical Exam: Estimated body mass index is 29.65 kg/m as calculated from the following:   Height as of this encounter: 6\' 5"  (1.956 m).   Weight as of this encounter: 250 lb (113.4 kg). BP 128/86   Pulse 64   Temp 97.9 F (36.6 C)   Ht 6\' 5"  (1.956 m)   Wt 250 lb (113.4 kg)   SpO2 98%   BMI 29.65 kg/m  General Appearance: Well nourished, in no apparent distress.  Eyes: PERRLA, EOMs, conjunctiva no swelling or erythema, normal fundi and vessels.  Sinuses: No Frontal/maxillary tenderness  ENT/Mouth: Ext aud canals clear, normal light reflex with TMs without erythema, bulging. Good dentition. No erythema, swelling, or exudate on post pharynx. Tonsils not swollen or erythematous. Hearing normal.  Neck: Supple, thyroid normal. No bruits  Respiratory: Respiratory effort  normal, BS equal bilaterally without rales, rhonchi, wheezing or stridor.  Cardio: RRR without murmurs, rubs  or gallops. Brisk peripheral pulses without edema.  Chest: symmetric, with normal excursions and percussion.  Abdomen: Soft, nontender, no guarding, rebound, hernias, masses, or organomegaly.  Lymphatics: Non tender without lymphadenopathy.  Genitourinary: denies concerns Musculoskeletal: Full ROM all peripheral extremities,5/5 strength, and normal gait. + varicose veins bilateral legs, no erythema, hard cord, neg homen's Skin: Left great toe with brown discoloration to toenail. Warm, dry without rashes, lesions, ecchymosis. Neuro: Cranial nerves intact, reflexes equal bilaterally. Normal muscle tone, no cerebellar symptoms. Sensation intact bil feet to monofilament.  Psych: Awake and oriented X 3, normal affect, Insight and Judgment appropriate.      Adela Glimpse, NP 10:21 AM [facility]

## 2023-08-18 LAB — COMPLETE METABOLIC PANEL WITH GFR
AG Ratio: 1.8 (calc) (ref 1.0–2.5)
ALT: 14 U/L (ref 9–46)
AST: 17 U/L (ref 10–35)
Albumin: 4.5 g/dL (ref 3.6–5.1)
Alkaline phosphatase (APISO): 52 U/L (ref 35–144)
BUN: 20 mg/dL (ref 7–25)
CO2: 28 mmol/L (ref 20–32)
Calcium: 9.7 mg/dL (ref 8.6–10.3)
Chloride: 104 mmol/L (ref 98–110)
Creat: 1.23 mg/dL (ref 0.70–1.35)
Globulin: 2.5 g/dL (ref 1.9–3.7)
Glucose, Bld: 91 mg/dL (ref 65–99)
Potassium: 4.6 mmol/L (ref 3.5–5.3)
Sodium: 140 mmol/L (ref 135–146)
Total Bilirubin: 0.7 mg/dL (ref 0.2–1.2)
Total Protein: 7 g/dL (ref 6.1–8.1)
eGFR: 66 mL/min/{1.73_m2} (ref >=60)

## 2023-08-18 LAB — CBC WITH DIFFERENTIAL/PLATELET
Absolute Lymphocytes: 1209 {cells}/uL (ref 850–3900)
Absolute Monocytes: 602 {cells}/uL (ref 200–950)
Basophils Absolute: 82 {cells}/uL (ref 0–200)
Basophils Relative: 1.6 %
Eosinophils Absolute: 61 {cells}/uL (ref 15–500)
Eosinophils Relative: 1.2 %
HCT: 47.3 % (ref 38.5–50.0)
Hemoglobin: 15.9 g/dL (ref 13.2–17.1)
MCH: 32.3 pg (ref 27.0–33.0)
MCHC: 33.6 g/dL (ref 32.0–36.0)
MCV: 95.9 fL (ref 80.0–100.0)
MPV: 9.7 fL (ref 7.5–12.5)
Monocytes Relative: 11.8 %
Neutro Abs: 3147 {cells}/uL (ref 1500–7800)
Neutrophils Relative %: 61.7 %
Platelets: 261 10*3/uL (ref 140–400)
RBC: 4.93 10*6/uL (ref 4.20–5.80)
RDW: 13 % (ref 11.0–15.0)
Total Lymphocyte: 23.7 %
WBC: 5.1 10*3/uL (ref 3.8–10.8)

## 2023-08-18 LAB — TSH: TSH: 63.03 m[IU]/L — ABNORMAL HIGH (ref 0.40–4.50)

## 2023-08-18 LAB — LIPID PANEL
Cholesterol: 203 mg/dL — ABNORMAL HIGH (ref <200)
HDL: 53 mg/dL (ref >=40)
LDL Cholesterol (Calc): 134 mg/dL — ABNORMAL HIGH
Non-HDL Cholesterol (Calc): 150 mg/dL — ABNORMAL HIGH (ref <130)
Total CHOL/HDL Ratio: 3.8 (calc) (ref <5.0)
Triglycerides: 70 mg/dL (ref <150)

## 2023-08-18 LAB — HEMOGLOBIN A1C
Hgb A1c MFr Bld: 10 %{Hb} — ABNORMAL HIGH (ref <5.7)
Mean Plasma Glucose: 240 mg/dL
eAG (mmol/L): 13.3 mmol/L

## 2023-08-19 ENCOUNTER — Other Ambulatory Visit: Payer: Self-pay | Admitting: Nurse Practitioner

## 2023-08-19 ENCOUNTER — Other Ambulatory Visit: Payer: Self-pay

## 2023-08-19 DIAGNOSIS — L601 Onycholysis: Secondary | ICD-10-CM

## 2023-08-19 MED ORDER — TERBINAFINE HCL 250 MG PO TABS: ORAL_TABLET | ORAL | 0 refills | Status: DC

## 2023-08-22 ENCOUNTER — Other Ambulatory Visit: Payer: Self-pay | Admitting: Nurse Practitioner

## 2023-08-22 DIAGNOSIS — E1122 Type 2 diabetes mellitus with diabetic chronic kidney disease: Secondary | ICD-10-CM

## 2023-08-22 MED ORDER — NOVOLOG MIX 70/30 FLEXPEN (70-30) 100 UNIT/ML ~~LOC~~ SUPN: PEN_INJECTOR | SUBCUTANEOUS | 3 refills | Status: AC

## 2023-08-25 ENCOUNTER — Telehealth: Payer: Self-pay | Admitting: Nurse Practitioner

## 2023-08-25 ENCOUNTER — Other Ambulatory Visit: Payer: Self-pay | Admitting: Nurse Practitioner

## 2023-08-25 DIAGNOSIS — E039 Hypothyroidism, unspecified: Secondary | ICD-10-CM

## 2023-08-25 DIAGNOSIS — L601 Onycholysis: Secondary | ICD-10-CM

## 2023-08-25 MED ORDER — LEVOTHYROXINE SODIUM 150 MCG PO TABS
ORAL_TABLET | ORAL | 0 refills | Status: AC
Start: 2023-08-25 — End: ?

## 2023-08-25 NOTE — Telephone Encounter (Signed)
I sent a 30 day supply to [facility] in [place]

## 2023-08-25 NOTE — Telephone Encounter (Signed)
Please refill Synthroid. Tonya sent a refill to [facility] on 12/18. Patient is leaving out of town today and has not received medication from mail service. Told patient we do require 24-48 hours on refills. Pharm-- [address] [place], [place] [address].

## 2023-10-28 ENCOUNTER — Other Ambulatory Visit: Payer: Self-pay

## 2023-10-28 ENCOUNTER — Other Ambulatory Visit: Payer: Self-pay | Admitting: Family

## 2023-10-28 MED ORDER — BD PEN NEEDLE NANO U/F 32G X 4 MM MISC: 11 refills | Status: AC

## 2024-05-15 ENCOUNTER — Encounter: Payer: Managed Care, Other (non HMO) | Admitting: Nurse Practitioner
